# Patient Record
Sex: Female | Born: 1935 | Race: White | Hispanic: No | Marital: Married | Smoking: Never smoker
Health system: Southern US, Community
[De-identification: ages and names within clinical notes are randomized; demographics above are authoritative.]

## PROBLEM LIST (undated history)

## (undated) DIAGNOSIS — N189 Chronic kidney disease, unspecified: Secondary | ICD-10-CM

## (undated) DIAGNOSIS — E039 Hypothyroidism, unspecified: Secondary | ICD-10-CM

## (undated) DIAGNOSIS — F419 Anxiety disorder, unspecified: Secondary | ICD-10-CM

## (undated) DIAGNOSIS — F32A Depression, unspecified: Secondary | ICD-10-CM

## (undated) DIAGNOSIS — E785 Hyperlipidemia, unspecified: Secondary | ICD-10-CM

## (undated) DIAGNOSIS — I10 Essential (primary) hypertension: Secondary | ICD-10-CM

## (undated) DIAGNOSIS — F329 Major depressive disorder, single episode, unspecified: Secondary | ICD-10-CM

## (undated) DIAGNOSIS — D649 Anemia, unspecified: Secondary | ICD-10-CM

## (undated) DIAGNOSIS — I509 Heart failure, unspecified: Secondary | ICD-10-CM

## (undated) DIAGNOSIS — I4892 Unspecified atrial flutter: Secondary | ICD-10-CM

## (undated) DIAGNOSIS — J449 Chronic obstructive pulmonary disease, unspecified: Secondary | ICD-10-CM

## (undated) DIAGNOSIS — C50919 Malignant neoplasm of unspecified site of unspecified female breast: Secondary | ICD-10-CM

## (undated) DIAGNOSIS — Z9981 Dependence on supplemental oxygen: Secondary | ICD-10-CM

## (undated) DIAGNOSIS — R413 Other amnesia: Secondary | ICD-10-CM

## (undated) HISTORY — DX: Essential (primary) hypertension: I10

## (undated) HISTORY — DX: Malignant neoplasm of unspecified site of unspecified female breast: C50.919

## (undated) HISTORY — DX: Hypothyroidism, unspecified: E03.9

## (undated) HISTORY — DX: Unspecified atrial flutter: I48.92

## (undated) HISTORY — DX: Anemia, unspecified: D64.9

## (undated) HISTORY — DX: Chronic obstructive pulmonary disease, unspecified: J44.9

## (undated) HISTORY — DX: Hyperlipidemia, unspecified: E78.5

## (undated) HISTORY — PX: APPENDECTOMY: SHX54

## (undated) HISTORY — DX: Other amnesia: R41.3

## (undated) HISTORY — PX: CATARACT EXTRACTION: SUR2

## (undated) HISTORY — PX: TONSILLECTOMY: SUR1361

## (undated) HISTORY — PX: ABDOMINAL HYSTERECTOMY: SHX81

---

## 1988-12-15 HISTORY — PX: MASTECTOMY: SHX3

## 1998-04-16 ENCOUNTER — Other Ambulatory Visit: Admission: RE | Admit: 1998-04-16 | Discharge: 1998-04-16 | Payer: Self-pay | Admitting: Hematology and Oncology

## 2000-04-13 ENCOUNTER — Encounter: Payer: Self-pay | Admitting: Family Medicine

## 2000-04-13 ENCOUNTER — Encounter: Admission: RE | Admit: 2000-04-13 | Discharge: 2000-04-13 | Payer: Self-pay | Admitting: Family Medicine

## 2001-04-14 ENCOUNTER — Encounter: Admission: RE | Admit: 2001-04-14 | Discharge: 2001-04-14 | Payer: Self-pay | Admitting: Family Medicine

## 2001-04-14 ENCOUNTER — Encounter: Payer: Self-pay | Admitting: Family Medicine

## 2002-04-15 ENCOUNTER — Encounter: Admission: RE | Admit: 2002-04-15 | Discharge: 2002-04-15 | Payer: Self-pay | Admitting: Family Medicine

## 2002-04-15 ENCOUNTER — Encounter: Payer: Self-pay | Admitting: Family Medicine

## 2003-04-19 ENCOUNTER — Encounter: Payer: Self-pay | Admitting: Family Medicine

## 2003-04-19 ENCOUNTER — Encounter: Admission: RE | Admit: 2003-04-19 | Discharge: 2003-04-19 | Payer: Self-pay | Admitting: Family Medicine

## 2004-04-19 ENCOUNTER — Encounter: Admission: RE | Admit: 2004-04-19 | Discharge: 2004-04-19 | Payer: Self-pay | Admitting: Family Medicine

## 2005-04-21 ENCOUNTER — Encounter: Admission: RE | Admit: 2005-04-21 | Discharge: 2005-04-21 | Payer: Self-pay | Admitting: Family Medicine

## 2006-05-20 ENCOUNTER — Encounter: Admission: RE | Admit: 2006-05-20 | Discharge: 2006-05-20 | Payer: Self-pay | Admitting: Family Medicine

## 2007-06-02 ENCOUNTER — Encounter: Admission: RE | Admit: 2007-06-02 | Discharge: 2007-06-02 | Payer: Self-pay | Admitting: Family Medicine

## 2007-08-18 ENCOUNTER — Encounter: Admission: RE | Admit: 2007-08-18 | Discharge: 2007-08-18 | Payer: Self-pay | Admitting: Family Medicine

## 2007-10-24 ENCOUNTER — Observation Stay (HOSPITAL_COMMUNITY): Admission: EM | Admit: 2007-10-24 | Discharge: 2007-10-25 | Payer: Self-pay | Admitting: Emergency Medicine

## 2007-10-25 ENCOUNTER — Encounter: Payer: Self-pay | Admitting: Critical Care Medicine

## 2007-10-25 ENCOUNTER — Encounter (INDEPENDENT_AMBULATORY_CARE_PROVIDER_SITE_OTHER): Payer: Self-pay | Admitting: Internal Medicine

## 2007-11-15 ENCOUNTER — Ambulatory Visit: Payer: Self-pay | Admitting: Internal Medicine

## 2007-11-15 ENCOUNTER — Ambulatory Visit: Payer: Self-pay | Admitting: Critical Care Medicine

## 2007-11-15 LAB — CONVERTED CEMR LAB
BUN: 17 mg/dL (ref 6–23)
CO2: 32 meq/L (ref 19–32)
Calcium: 9 mg/dL (ref 8.4–10.5)
Chloride: 103 meq/L (ref 96–112)
GFR calc Af Amer: 77 mL/min
GFR calc non Af Amer: 64 mL/min
Sodium: 141 meq/L (ref 135–145)

## 2007-11-17 DIAGNOSIS — J449 Chronic obstructive pulmonary disease, unspecified: Secondary | ICD-10-CM

## 2007-11-17 DIAGNOSIS — C50919 Malignant neoplasm of unspecified site of unspecified female breast: Secondary | ICD-10-CM | POA: Insufficient documentation

## 2007-11-17 DIAGNOSIS — J4489 Other specified chronic obstructive pulmonary disease: Secondary | ICD-10-CM | POA: Insufficient documentation

## 2007-11-17 DIAGNOSIS — E785 Hyperlipidemia, unspecified: Secondary | ICD-10-CM

## 2007-11-22 ENCOUNTER — Telehealth (INDEPENDENT_AMBULATORY_CARE_PROVIDER_SITE_OTHER): Payer: Self-pay | Admitting: *Deleted

## 2007-11-30 ENCOUNTER — Ambulatory Visit: Payer: Self-pay | Admitting: Critical Care Medicine

## 2007-11-30 ENCOUNTER — Telehealth (INDEPENDENT_AMBULATORY_CARE_PROVIDER_SITE_OTHER): Payer: Self-pay | Admitting: *Deleted

## 2007-11-30 DIAGNOSIS — M810 Age-related osteoporosis without current pathological fracture: Secondary | ICD-10-CM | POA: Insufficient documentation

## 2007-11-30 DIAGNOSIS — I10 Essential (primary) hypertension: Secondary | ICD-10-CM | POA: Insufficient documentation

## 2008-02-15 ENCOUNTER — Telehealth: Payer: Self-pay | Admitting: Critical Care Medicine

## 2008-03-13 ENCOUNTER — Telehealth (INDEPENDENT_AMBULATORY_CARE_PROVIDER_SITE_OTHER): Payer: Self-pay | Admitting: *Deleted

## 2008-03-21 ENCOUNTER — Ambulatory Visit: Payer: Self-pay | Admitting: Critical Care Medicine

## 2008-07-12 ENCOUNTER — Encounter: Admission: RE | Admit: 2008-07-12 | Discharge: 2008-07-12 | Payer: Self-pay | Admitting: Family Medicine

## 2008-08-11 ENCOUNTER — Ambulatory Visit: Payer: Self-pay | Admitting: Critical Care Medicine

## 2008-08-22 ENCOUNTER — Ambulatory Visit: Payer: Self-pay | Admitting: Critical Care Medicine

## 2008-08-22 ENCOUNTER — Telehealth: Payer: Self-pay | Admitting: Critical Care Medicine

## 2008-08-22 ENCOUNTER — Encounter: Payer: Self-pay | Admitting: Critical Care Medicine

## 2009-05-15 ENCOUNTER — Ambulatory Visit: Payer: Self-pay | Admitting: Critical Care Medicine

## 2009-05-16 ENCOUNTER — Telehealth (INDEPENDENT_AMBULATORY_CARE_PROVIDER_SITE_OTHER): Payer: Self-pay | Admitting: *Deleted

## 2009-05-29 ENCOUNTER — Ambulatory Visit: Payer: Self-pay | Admitting: Critical Care Medicine

## 2009-06-04 ENCOUNTER — Telehealth: Payer: Self-pay | Admitting: Critical Care Medicine

## 2009-06-07 ENCOUNTER — Telehealth (INDEPENDENT_AMBULATORY_CARE_PROVIDER_SITE_OTHER): Payer: Self-pay | Admitting: *Deleted

## 2009-06-25 ENCOUNTER — Telehealth (INDEPENDENT_AMBULATORY_CARE_PROVIDER_SITE_OTHER): Payer: Self-pay | Admitting: *Deleted

## 2009-07-02 ENCOUNTER — Ambulatory Visit: Payer: Self-pay | Admitting: Critical Care Medicine

## 2009-07-05 ENCOUNTER — Encounter: Payer: Self-pay | Admitting: Critical Care Medicine

## 2009-07-31 ENCOUNTER — Encounter: Admission: RE | Admit: 2009-07-31 | Discharge: 2009-07-31 | Payer: Self-pay | Admitting: Family Medicine

## 2009-08-13 ENCOUNTER — Telehealth: Payer: Self-pay | Admitting: Critical Care Medicine

## 2009-08-27 ENCOUNTER — Ambulatory Visit: Payer: Self-pay | Admitting: Critical Care Medicine

## 2009-09-01 ENCOUNTER — Encounter: Payer: Self-pay | Admitting: Critical Care Medicine

## 2009-09-19 ENCOUNTER — Encounter: Admission: RE | Admit: 2009-09-19 | Discharge: 2009-09-19 | Payer: Self-pay | Admitting: Family Medicine

## 2009-11-22 ENCOUNTER — Encounter: Payer: Self-pay | Admitting: Critical Care Medicine

## 2010-03-27 ENCOUNTER — Ambulatory Visit: Payer: Self-pay | Admitting: Critical Care Medicine

## 2010-03-27 ENCOUNTER — Encounter (INDEPENDENT_AMBULATORY_CARE_PROVIDER_SITE_OTHER): Payer: Self-pay | Admitting: *Deleted

## 2010-04-09 ENCOUNTER — Encounter: Payer: Self-pay | Admitting: Critical Care Medicine

## 2010-05-20 ENCOUNTER — Telehealth: Payer: Self-pay | Admitting: Critical Care Medicine

## 2010-05-27 ENCOUNTER — Telehealth: Payer: Self-pay | Admitting: Internal Medicine

## 2010-05-31 ENCOUNTER — Telehealth: Payer: Self-pay | Admitting: Critical Care Medicine

## 2010-05-31 ENCOUNTER — Telehealth (INDEPENDENT_AMBULATORY_CARE_PROVIDER_SITE_OTHER): Payer: Self-pay | Admitting: *Deleted

## 2010-06-03 ENCOUNTER — Telehealth (INDEPENDENT_AMBULATORY_CARE_PROVIDER_SITE_OTHER): Payer: Self-pay | Admitting: *Deleted

## 2010-06-05 ENCOUNTER — Telehealth (INDEPENDENT_AMBULATORY_CARE_PROVIDER_SITE_OTHER): Payer: Self-pay | Admitting: *Deleted

## 2010-06-10 ENCOUNTER — Encounter: Payer: Self-pay | Admitting: Internal Medicine

## 2010-06-18 ENCOUNTER — Telehealth (INDEPENDENT_AMBULATORY_CARE_PROVIDER_SITE_OTHER): Payer: Self-pay | Admitting: *Deleted

## 2010-06-24 ENCOUNTER — Encounter: Payer: Self-pay | Admitting: Critical Care Medicine

## 2010-06-25 ENCOUNTER — Encounter: Payer: Self-pay | Admitting: Internal Medicine

## 2010-07-01 ENCOUNTER — Encounter: Payer: Self-pay | Admitting: Critical Care Medicine

## 2010-08-12 ENCOUNTER — Ambulatory Visit: Payer: Self-pay | Admitting: Critical Care Medicine

## 2010-08-16 ENCOUNTER — Encounter: Payer: Self-pay | Admitting: Critical Care Medicine

## 2010-09-12 ENCOUNTER — Encounter: Admission: RE | Admit: 2010-09-12 | Discharge: 2010-09-12 | Payer: Self-pay | Admitting: Family Medicine

## 2010-09-17 ENCOUNTER — Encounter: Payer: Self-pay | Admitting: Critical Care Medicine

## 2011-01-06 ENCOUNTER — Encounter: Payer: Self-pay | Admitting: Family Medicine

## 2011-01-14 NOTE — Miscellaneous (Signed)
Summary: Face to face encounter/[facility]  Face to face encounter/[facility]   Imported By: Sherian Rein 09/25/2010 10:20:17  _____________________________________________________________________  External Attachment:    Type:   Image     Comment:   External Document

## 2011-01-14 NOTE — Assessment & Plan Note (Signed)
Summary: Pulmonary OV   Primary Provider/Referring Provider:  Herb Grays  CC:  Follow up.  States breathing is worse.  Having increased SOB with activity x 3 wks.  Denies wheezing and chest tightness and cough.  Requesting rx for wheelchair.Marland Kitchen  History of Present Illness: Pulmonary OV:  This is an 77 year old, white female with chronic obstructive lung disease.  Primary emphysematous component.    March 27, 2010 1:50 PM More dyspneic than last ov.  Over past month more issues.  No more cough.   No wheeze.  No mucous.  No chest pain.  No f/c/s.  No edema in feet.  No pndrip.  No at bedtime dyspnea.   Pt denies any significant sore throat, nasal congestion or excess secretions, fever, chills, sweats, unintended weight loss, pleurtic or exertional chest pain, orthopnea PND, or leg swelling Pt denies any increase in rescue therapy over baseline, denies waking up needing it or having any early am or nocturnal exacerbations of coughing/wheezing/or dyspnea.      Preventive Screening-Counseling & Management  Alcohol-Tobacco     Smoking Status: quit > 6 months  Current Medications (verified): 1)  Levothroid 100 Mcg  Tabs (Levothyroxine Sodium) .... One By Mouth Once Daily 2)  Lipitor 20 Mg  Tabs (Atorvastatin Calcium) .... Take 1 Tablet By Mouth Once A Day 3)  Klor-Con M10 10 Meq  Tbcr (Potassium Chloride Crys Cr) .... One By Mouth Once Daily 4)  Furosemide 40 Mg  Tabs (Furosemide) .... One By Mouth Once Daily 5)  Alprazolam 0.25 Mg  Tabs (Alprazolam) .... One By Mouth Three Times A Day As Needed 6)  Adult Aspirin Ec Low Strength 81 Mg  Tbec (Aspirin) .... .qdtab 7)  Lisinopril 40 Mg Tabs (Lisinopril) .... 80mg  By Mouth Daily 8)  Aricept 10 Mg Tabs (Donepezil Hcl) .Marland Kitchen.. 1 By Mouth Daily 9)  Verapamil Hcl Cr 240 Mg Xr24h-Cap (Verapamil Hcl) .... Once Daily 10)  Spiriva Handihaler 18 Mcg  Caps (Tiotropium Bromide Monohydrate) .... Two Puffs in Handihaler Daily 11)  Proair Hfa 108 (90 Base)  Mcg/act Aers (Albuterol Sulfate) .Marland Kitchen.. 1-2 Puffs Every 4 Hours As Needed  Pt Needs Appt With Dr. Delford Field 12)  Oxygen .... 2l Rest 3l Exertion 13)  Citalopram Hydrobromide 20 Mg Tabs (Citalopram Hydrobromide) .... 3 Tabs Once Daily 14)  Calcitonin (Salmon) 200 Unit/act Soln (Calcitonin (Salmon)) .... Once Daily 15)  Vitamin D .... Once Daily 16)  Ambien 10 Mg Tabs (Zolpidem Tartrate) .... At Bedtime 17)  Calcium-Vitamin D 250-125 Mg-Unit Tabs (Calcium Carbonate-Vitamin D) .... Two Times A Day 18)  Iron .... Two Times A Day  Allergies (verified): 1)  ! Codeine  Past History:  Past medical, surgical, family and social histories (including risk factors) reviewed, and no changes noted (except as noted below).  Past Medical History: Reviewed history from 11/30/2007 and no changes required. C O P D Hyperlipidemia Hypertension Breast Cancer R mastectomy 1990 Osteoporosis  Past Pulmonary History:  Pulmonary History: Pulmonary Function Test  Date: 08/22/2008 Height (in.): 64 Gender: Female  Pre-Spirometry  FVC     Value: 1.65 L/min   Pred: 2.49 L/min     % Pred: 66 % FEV1     Value: 0.97 L     Pred: 1.66 L     % Pred: 58 % FEV1/FVC   Value: 59 %     Pred: 69 %     FEF 25-75   Value: 0.37 L/min   Pred: 1.87 L/min     %  Pred: 20 %  Post-Spirometry  FVC     Value: 1.94 L/min   Pred: 2.49 L/min     % Pred: 78 % FEV1     Value: 1.00 L     Pred: 1.66 L     % Pred: 60 % FEV1/FVC   Value: 52 %     Pred: 69 %     FEF 25-75   Value: 0.19 L/min   Pred: 1.87 L/min     % Pred: 10 %  Lung Volumes  TLC     Value: 4.57 L   % Pred: 97 % RV     Value: 2.92 L   % Pred: 142 % DLCO     Value: 9.7 %   % Pred: 46 % DLCO/VA   Value: 3.08 %   % Pred: 97 %  Family History: Reviewed history from 11/30/2007 and no changes required. Emphysema:  MI/Heart Attack  Social History: Reviewed history from 11/30/2007 and no changes required. Patient never smoked.  Positive history of passive tobacco smoke  exposure.   Review of Systems       The patient complains of shortness of breath with activity.  The patient denies shortness of breath at rest, productive cough, non-productive cough, coughing up blood, chest pain, irregular heartbeats, acid heartburn, indigestion, loss of appetite, weight change, abdominal pain, difficulty swallowing, sore throat, tooth/dental problems, headaches, nasal congestion/difficulty breathing through nose, sneezing, itching, ear ache, anxiety, depression, hand/feet swelling, joint stiffness or pain, rash, change in color of mucus, and fever.    Vital Signs:  Patient profile:   77 year old female Height:      64 inches Weight:      208.25 pounds O2 Sat:      90 % on 2 L/minpulsed Temp:     98.3 degrees F oral Pulse rate:   55 / minute BP sitting:   118 / 64  (left arm) Cuff size:   regular  Vitals Entered By: Gweneth Dimitri RN (March 27, 2010 1:45 PM)  O2 Flow:  2 L/minpulsed CC: Follow up.  States breathing is worse.  Having increased SOB with activity x 3 wks.  Denies wheezing, chest tightness and cough.  Requesting rx for wheelchair. Comments Medications reviewed with patient Daytime contact number verified with patient. Gweneth Dimitri RN  March 27, 2010 1:46 PM    Physical Exam  Additional Exam:  Gen: Pleasant, well-nourished, in no distress , normal affect ENT: no lesions, no post nasal drip Neck: No JVD, no TMG, no carotid bruits Lungs: No use of accessory muscles, no dullness to percussion, distant Bs,  poor airflow Cardiovascular: RRR, heart sounds normal, no murmurs or gallops, no peripheral edema Abdomen: soft and non-tender, no HSM, BS normal Musculoskeletal: No deformities, no cyanosis or clubbing Neuro: alert, non-focal     Pulmonary Function Test Date: 03/27/2010 2:07 PM Gender: Female  Pre-Spirometry FVC    Value: 1.12 L/min   % Pred: 47.40 % FEV1    Value: 0.56 L     Pred: 1.74 L     % Pred: 32.40 % FEV1/FVC  Value: 50.39 %      % Pred: 69.30 %  Impression & Recommendations:  Problem # 1:  C O P D (ICD-496) Assessment Deteriorated  COPD with primary emphysematous component,  progression of disease and dyspnea seen  plan: Cont Spiriva Cont oxygen Pulse prednisone  Wheelchair for the patient   Medications Added to Medication List This Visit: 1)  Citalopram Hydrobromide 20 Mg Tabs (Citalopram hydrobromide) .... 3 tabs once daily 2)  Calcitonin (salmon) 200 Unit/act Soln (Calcitonin (salmon)) .... Once daily 3)  Vitamin D  .... Once daily 4)  Ambien 10 Mg Tabs (Zolpidem tartrate) .... At bedtime 5)  Calcium-vitamin D 250-125 Mg-unit Tabs (Calcium carbonate-vitamin d) .... Two times a day 6)  Iron  .... Two times a day 7)  Prednisone 10 Mg Tabs (Prednisone) .... Take as directed take 4 daily for two days, then 3 daily for two days, then two daily for two days then one daily for two days then stop  Complete Medication List: 1)  Levothroid 100 Mcg Tabs (Levothyroxine sodium) .... One by mouth once daily 2)  Lipitor 20 Mg Tabs (Atorvastatin calcium) .... Take 1 tablet by mouth once a day 3)  Klor-con M10 10 Meq Tbcr (Potassium chloride crys cr) .... One by mouth once daily 4)  Furosemide 40 Mg Tabs (Furosemide) .... One by mouth once daily 5)  Alprazolam 0.25 Mg Tabs (Alprazolam) .... One by mouth three times a day as needed 6)  Adult Aspirin Ec Low Strength 81 Mg Tbec (Aspirin) .... .qdtab 7)  Lisinopril 40 Mg Tabs (Lisinopril) .... 80mg  by mouth daily 8)  Aricept 10 Mg Tabs (Donepezil hcl) .Marland Kitchen.. 1 by mouth daily 9)  Verapamil Hcl Cr 240 Mg Xr24h-cap (Verapamil hcl) .... Once daily 10)  Spiriva Handihaler 18 Mcg Caps (Tiotropium bromide monohydrate) .... Two puffs in handihaler daily 11)  Proair Hfa 108 (90 Base) Mcg/act Aers (Albuterol sulfate) .Marland Kitchen.. 1-2 puffs every 4 hours as needed  pt needs appt with dr. Delford Field 12)  Oxygen  .... 2l rest 3l exertion 13)  Citalopram Hydrobromide 20 Mg Tabs (Citalopram  hydrobromide) .... 3 tabs once daily 14)  Calcitonin (salmon) 200 Unit/act Soln (Calcitonin (salmon)) .... Once daily 15)  Vitamin D  .... Once daily 16)  Ambien 10 Mg Tabs (Zolpidem tartrate) .... At bedtime 17)  Calcium-vitamin D 250-125 Mg-unit Tabs (Calcium carbonate-vitamin d) .... Two times a day 18)  Iron  .... Two times a day 19)  Prednisone 10 Mg Tabs (Prednisone) .... Take as directed take 4 daily for two days, then 3 daily for two days, then two daily for two days then one daily for two days then stop  Other Orders: Spirometry w/Graph (94010) Est. Patient Level IV (52778) Prescription Created Electronically [id number]) DME Referral (DME)  Patient Instructions: 1)  Prednisone 10mg  Take 4 daily for two days, then 3 daily for two days, then two daily for two days then one daily for two days then stop 2)  Stay on Spiriva  3)  A Wheelchair will be ordered  4)  Return 4 months  Prescriptions: PREDNISONE 10 MG  TABS (PREDNISONE) Take as directed Take 4 daily for two days, then 3 daily for two days, then two daily for two days then one daily for two days then stop  #20 x 0   Entered and Authorized by:   Storm Frisk MD   Signed by:   Storm Frisk MD on 03/27/2010   Method used:   Electronically to        [facility] [address] (retail)       [address] [place] [address]       [place], [place]  [address]       Ph: [phone] or [phone]       Fax: [phone]   RxID:   [id number]  CardioPerfect Spirometry  ID: [id number] Patient: KEELY, DRENNAN DOB: [birth year] Age: 77 Years Old Sex: Female Race: White Height: 64 Weight: 208.25 Status: Confirmed Past Medical History:  C O P D Hyperlipidemia Hypertension Breast Cancer R mastectomy 1990 Osteoporosis  Recorded: 03/27/2010 2:07 PM  Parameter  Measured Predicted %Predicted FVC     1.12        2.36        47.40 FEV1     0.56        1.74        32.40 FEV1%   50.39        72.75        69.30 PEF    2.05        4.25         48.20   Comments: Severe airflow obstruction  Interpretation: Pre: FVC= 1.12L FEV1= 0.56L FEV1%= 50.4% 0.56/1.12 FEV1/FVC (03/27/2010 2:09:03 PM), Very severe obstruction.Severe restriction     Appended Document: Pulmonary OV fax Herb Grays

## 2011-01-14 NOTE — Assessment & Plan Note (Signed)
Summary: Pulmonary OV   Primary Provider/Referring Provider:  Herb Grays  CC:  4 month follow up.  Pt states she does have SOB with activity but this is no worse from last OV.  Denies wheezing, chest tightness, and cough.  .  History of Present Illness: Pulmonary OV:  This is an 77 year old, white female with chronic obstructive lung disease.  Primary emphysematous component.      August 12, 2010 12:12 PM Dyspnea is the same.  comes and goes.  No cough or mucus.  No chest pains.  No edema in feet.   No nocturnal dyspnea.    No new issues.  Lives alone with son living next door.  Preventive Screening-Counseling & Management  Alcohol-Tobacco     Smoking Status: quit > 6 months     Passive Smoke Exposure: yes  Current Medications (verified): 1)  Levothroid 100 Mcg  Tabs (Levothyroxine Sodium) .... One By Mouth Once Daily 2)  Lipitor 20 Mg  Tabs (Atorvastatin Calcium) .... Take 1 Tablet By Mouth Once A Day 3)  Klor-Con M10 10 Meq  Tbcr (Potassium Chloride Crys Cr) .... One By Mouth Once Daily 4)  Furosemide 40 Mg  Tabs (Furosemide) .... One By Mouth Once Daily 5)  Alprazolam 0.25 Mg  Tabs (Alprazolam) .... One By Mouth Three Times A Day As Needed 6)  Adult Aspirin Ec Low Strength 81 Mg  Tbec (Aspirin) .... .qdtab 7)  Lisinopril 40 Mg Tabs (Lisinopril) .... 80mg  By Mouth Daily 8)  Aricept 10 Mg Tabs (Donepezil Hcl) .Marland Kitchen.. 1 By Mouth Daily 9)  Verapamil Hcl Cr 240 Mg Xr24h-Cap (Verapamil Hcl) .... Once Daily 10)  Proair Hfa 108 (90 Base) Mcg/act Aers (Albuterol Sulfate) .Marland Kitchen.. 1-2 Puffs Every 4 Hours As Needed  Pt Needs Appt With Dr. Delford Field 11)  Oxygen .... 2l Rest 3l Exertion 12)  Citalopram Hydrobromide 20 Mg Tabs (Citalopram Hydrobromide) .... 3 Tabs Once Daily 13)  Calcitonin (Salmon) 200 Unit/act Soln (Calcitonin (Salmon)) .... Once Daily 14)  Vitamin D .... Once Daily 15)  Ambien 10 Mg Tabs (Zolpidem Tartrate) .... At Bedtime 16)  Calcium-Vitamin D 250-125 Mg-Unit Tabs (Calcium  Carbonate-Vitamin D) .... Two Times A Day 17)  Iron .... Two Times A Day  Allergies (verified): 1)  ! Codeine  Past Pulmonary History:  Pulmonary History: Pulmonary Function Test  Date: 08/22/2008 Height (in.): 64 Gender: Female  Pre-Spirometry  FVC     Value: 1.65 L/min   Pred: 2.49 L/min     % Pred: 66 % FEV1     Value: 0.97 L     Pred: 1.66 L     % Pred: 58 % FEV1/FVC   Value: 59 %     Pred: 69 %     FEF 25-75   Value: 0.37 L/min   Pred: 1.87 L/min     % Pred: 20 %  Post-Spirometry  FVC     Value: 1.94 L/min   Pred: 2.49 L/min     % Pred: 78 % FEV1     Value: 1.00 L     Pred: 1.66 L     % Pred: 60 % FEV1/FVC   Value: 52 %     Pred: 69 %     FEF 25-75   Value: 0.19 L/min   Pred: 1.87 L/min     % Pred: 10 %  Lung Volumes  TLC     Value: 4.57 L   % Pred: 97 % RV  Value: 2.92 L   % Pred: 142 % DLCO     Value: 9.7 %   % Pred: 46 % DLCO/VA   Value: 3.08 %   % Pred: 97 %  Review of Systems       The patient complains of shortness of breath with activity and non-productive cough.  The patient denies shortness of breath at rest, productive cough, coughing up blood, chest pain, irregular heartbeats, acid heartburn, indigestion, loss of appetite, weight change, abdominal pain, difficulty swallowing, sore throat, tooth/dental problems, headaches, nasal congestion/difficulty breathing through nose, sneezing, itching, ear ache, anxiety, depression, hand/feet swelling, joint stiffness or pain, rash, change in color of mucus, and fever.    Vital Signs:  Patient profile:   77 year old female Height:      64 inches Weight:      202 pounds BMI:     34.80 O2 Sat:      95 % on 3 L/minpulsed Temp:     98.5 degrees F oral Pulse rate:   66 / minute BP sitting:   126 / 66  (left arm) Cuff size:   regular  Vitals Entered By: Gweneth Dimitri RN (August 12, 2010 12:08 PM)  O2 Flow:  3 L/minpulsed CC: 4 month follow up.  Pt states she does have SOB with activity but this is no worse from last  OV.  Denies wheezing, chest tightness, cough.   Comments Medications reviewed with patient Daytime contact number verified with patient. Gweneth Dimitri RN  August 12, 2010 12:08 PM    Physical Exam  Additional Exam:  Gen: Pleasant, well-nourished, in no distress , normal affect ENT: no lesions, no post nasal drip Neck: No JVD, no TMG, no carotid bruits Lungs: No use of accessory muscles, no dullness to percussion, distant Bs,  poor airflow Cardiovascular: RRR, heart sounds normal, no murmurs or gallops, no peripheral edema Abdomen: soft and non-tender, no HSM, BS normal Musculoskeletal: No deformities, no cyanosis or clubbing Neuro: alert, non-focal     Impression & Recommendations:  Problem # 1:  C O P D (ICD-496) Assessment Unchanged COPD with primary emphysematous component  plan: Cont Spiriva Cont oxygen  Complete Medication List: 1)  Levothroid 100 Mcg Tabs (Levothyroxine sodium) .... One by mouth once daily 2)  Lipitor 20 Mg Tabs (Atorvastatin calcium) .... Take 1 tablet by mouth once a day 3)  Klor-con M10 10 Meq Tbcr (Potassium chloride crys cr) .... One by mouth once daily 4)  Furosemide 40 Mg Tabs (Furosemide) .... One by mouth once daily 5)  Alprazolam 0.25 Mg Tabs (Alprazolam) .... One by mouth three times a day as needed 6)  Adult Aspirin Ec Low Strength 81 Mg Tbec (Aspirin) .... .qdtab 7)  Lisinopril 40 Mg Tabs (Lisinopril) .... 80mg  by mouth daily 8)  Aricept 10 Mg Tabs (Donepezil hcl) .Marland Kitchen.. 1 by mouth daily 9)  Verapamil Hcl Cr 240 Mg Xr24h-cap (Verapamil hcl) .... Once daily 10)  Proair Hfa 108 (90 Base) Mcg/act Aers (Albuterol sulfate) .Marland Kitchen.. 1-2 puffs every 4 hours as needed  pt needs appt with dr. Delford Field 11)  Oxygen  .... 2l rest 3l exertion 12)  Citalopram Hydrobromide 20 Mg Tabs (Citalopram hydrobromide) .... 3 tabs once daily 13)  Calcitonin (salmon) 200 Unit/act Soln (Calcitonin (salmon)) .... Once daily 14)  Vitamin D  .... Once daily 15)  Ambien 10  Mg Tabs (Zolpidem tartrate) .... At bedtime 16)  Calcium-vitamin D 250-125 Mg-unit Tabs (Calcium carbonate-vitamin d) .... Two times  a day 17)  Iron  .... Two times a day  Patient Instructions: 1)  No change in medications 2)  Return in    4      months

## 2011-01-14 NOTE — Miscellaneous (Signed)
Summary: Plan of Care & Treatment/[facility]  Plan of Care & Treatment/[facility]   Imported By: Sherian Rein 06/27/2010 10:26:03  _____________________________________________________________________  External Attachment:    Type:   Image     Comment:   External Document

## 2011-01-14 NOTE — Miscellaneous (Signed)
Summary: Order / [facility]  Order / [facility]   Imported By: Lennie Odor 06/14/2010 11:36:47  _____________________________________________________________________  External Attachment:    Type:   Image     Comment:   External Document

## 2011-01-14 NOTE — Miscellaneous (Signed)
Summary: F2F/[facility]  F2F/[facility]   Imported By: Lester [place] 07/01/2010 10:36:14  _____________________________________________________________________  External Attachment:    Type:   Image     Comment:   External Document

## 2011-01-14 NOTE — Miscellaneous (Signed)
Summary: Orders/[facility]  Orders/[facility]   Imported By: Lester [place] 07/04/2010 10:48:50  _____________________________________________________________________  External Attachment:    Type:   Image     Comment:   External Document

## 2011-01-14 NOTE — Letter (Signed)
Summary: CMN for Wheelchair/[facility]  CMN for Wheelchair/[facility]   Imported By: Sherian Rein 04/15/2010 07:51:59  _____________________________________________________________________  External Attachment:    Type:   Image     Comment:   External Document

## 2011-01-14 NOTE — Progress Notes (Signed)
Summary: order  Phone Note From Other Clinic Call back at [phone]   Caller: Patient Caller: [facility]  Britta Mccreedy Call For: wert Summary of Call: need order for bathe nurse . pt have also reduce nurse visit this week to one time this weekly Initial call taken by: Rickard Patience,  June 05, 2010 1:39 PM  Follow-up for Phone Call        ok for aide to hep with baths per dr wert Follow-up by: Philipp Deputy CMA,  June 05, 2010 3:56 PM

## 2011-01-14 NOTE — Progress Notes (Signed)
Summary: order request  Phone Note Call from Patient   Caller: Daughter Call For: wright Summary of Call: dr wert (in dr wright's absence) approved for pt to have home health care. daughter muffett garber requests that this order be faxed to [facility] [phone] attn: [place] [facility]. this will move this along quicker. thanks. muffett # [phone] Initial call taken by: Tivis Ringer, CNA,  May 31, 2010 9:06 AM  Follow-up for Phone Call        order printed from EMR and faxed to above fax number Attn: [place] [facility].  Pt's daughter Muffett aware.  Gweneth Dimitri RN  May 31, 2010 9:57 AM

## 2011-01-14 NOTE — Progress Notes (Signed)
Summary: bath assistance> orders should be thur Primary  Phone Note Call from Patient Call back at [phone]   Caller: Daughter-Muffett August Luz Call For: Delford Field Reason for Call: Talk to Nurse Summary of Call: pt's daughter called, told her PEW was out the week and she wanted to know if another doctor would write order for bath assistance for pt?  If they have to wait for PEW they will.  Pt's PMD is out of [place] for 6 weeks, but would like [place] to ask doc of day about this assistance. Initial call taken by: Eugene Gavia,  May 27, 2010 10:03 AM  Follow-up for Phone Call        Pt daughter is requesting to have an order placed to [facility] requesitng some home health for helping her mother bathe, wash her hair and assist with clipping her toenails. Pt PMD is out of the country x 6 weeks and PW out of office and pt does not want to wait for order so she requests we send this to doc-of -the day. Pelase advise if ok to palce order.  Thanks. Carron Curie CMA  May 27, 2010 10:34 AM Babette Relic [place] is primary and should have doctors covering for her who can take care of her messages while she's gone for that long. We are just doing her pulmonary orders.  If not able to get this thru Dr Yehuda Budd office then ok to approve Follow-up by: Nyoka Cowden MD,  May 27, 2010 10:45 AM  Additional Follow-up for Phone Call Additional follow up Details #1::        Spoke with pt daughter and PMD office is not wanting to send order, so I advised that we will do so. order placed and sent to [facility]. Carron Curie CMA  May 27, 2010 11:43 AM      Appended Document: bath assistance> orders should be thur Primary noted adn I am ok with this

## 2011-01-14 NOTE — Progress Notes (Signed)
Summary: DME[facility] calling  Phone Note From Other Clinic   Caller: heather w/ [facility] Call For: wright Summary of Call: caller received DME for pt. however, they don't send a person to pt's home for personal care. only supply O2, etc. heather [phone] x [phone] Initial call taken by: Tivis Ringer, CNA,  May 31, 2010 10:18 AM  Follow-up for Phone Call        spoke to [facility] she has been informed that she needs to contact lecritia about any problems she has with orders,lmtcb with lecritia  Additional Follow-up for Phone Call Additional follow up Details #1::        noted  Additional Follow-up by: Storm Frisk MD,  May 31, 2010 12:14 PM

## 2011-01-14 NOTE — Progress Notes (Signed)
Summary: order request  Phone Note From Other Clinic   Caller: louise w/ [facility] Call For: wright Summary of Call: requests order for tub seat since pt is unable to lift legs. [phone] (verbal order request) louise Initial call taken by: Tivis Ringer, CNA,  June 03, 2010 3:11 PM  Follow-up for Phone Call        Dr Delford Field please advise if this is okay, thanks! Follow-up by: Vernie Murders,  June 03, 2010 3:37 PM  Additional Follow-up for Phone Call Additional follow up Details #1::        this is ok Additional Follow-up by: Storm Frisk MD,  June 03, 2010 5:39 PM    Additional Follow-up for Phone Call Additional follow up Details #2::    LMOM FOR LOUISE AT [facility] THAT IT IS OK FOR TUB SEAT--ONLY A VERBAL ORDER WAS NEEDED Follow-up by: Philipp Deputy CMA,  June 04, 2010 8:29 AM

## 2011-01-14 NOTE — Miscellaneous (Signed)
Summary: Orders Update  Clinical Lists Changes  Orders: Added new Service order of Est. Patient Level III (99213) - Signed 

## 2011-01-14 NOTE — Progress Notes (Signed)
Summary: [facility]  Phone Note From Other Clinic   Caller: [phone] OR FAX  TO [phone] Call For: WRIGHT Summary of Call: WANTS AN ORDER FOR PHYSCIAL THERAPY SENT TO [facility] Initial call taken by: Lacinda Axon,  June 18, 2010 12:49 PM  Follow-up for Phone Call        dr Delford Field [facility] is requesting order for physical therapy-eval and treat, pt had a recent fall and they think this may help with strength building--i advised them to contact primary md and they state primary refuses to order and they was advised to contact you since all orders have come from you so far.--pls advise if ok to send Follow-up by: Philipp Deputy CMA,  June 19, 2010 10:00 AM  Additional Follow-up for Phone Call Additional follow up Details #1::        i am ok with PT order in this patient Additional Follow-up by: Storm Frisk MD,  June 19, 2010 10:47 AM    Additional Follow-up for Phone Call Additional follow up Details #2::    order sent to [facility] for pt eval and treat and barbara with [facility] order is being faxed Follow-up by: Philipp Deputy CMA,  June 19, 2010 11:34 AM

## 2011-01-14 NOTE — Progress Notes (Signed)
Summary: order  Phone Note Call from Patient Call back at [phone]   Caller: Daughter//muffett Call For: [name] Summary of Call: Need an order to have someone come in and assist her mom with bath. Initial call taken by: Darletta Moll,  May 20, 2010 10:16 AM  Follow-up for Phone Call        Pt daughter is requesting to have an order placed to [facility] for helping her mother bathe, was her hair and assist with clipping her toenails. daughter advised PW out of office until 05-27-10 and ok to wait until then. Please advise. Carron Curie CMA  May 20, 2010 11:23 AM  Message was sent back into triage unanswered so I called the pt daughter and advised that it would be best for them to either contact the pt PMD or call back on monday to have this addressed by PW. Daughter states that [place] so she will call back on monday to ask PW about order. Carron Curie CMA  May 21, 2010 10:36 AM

## 2011-04-29 ENCOUNTER — Ambulatory Visit (INDEPENDENT_AMBULATORY_CARE_PROVIDER_SITE_OTHER): Payer: Medicare Other | Admitting: Critical Care Medicine

## 2011-04-29 ENCOUNTER — Encounter: Payer: Self-pay | Admitting: Critical Care Medicine

## 2011-04-29 DIAGNOSIS — J449 Chronic obstructive pulmonary disease, unspecified: Secondary | ICD-10-CM

## 2011-04-29 MED ORDER — TIOTROPIUM BROMIDE MONOHYDRATE 18 MCG IN CAPS: 18.0000 ug | ORAL_CAPSULE | Freq: Every day | RESPIRATORY_TRACT | Status: DC

## 2011-04-29 NOTE — H&P (Signed)
NAME:  [name], [name]                  ACCOUNT NO.:  [id number]   MEDICAL RECORD NO.:  [id number]          PATIENT TYPE:  INP   LOCATION:  5710                         FACILITY:  [facility]   PHYSICIAN:  Lonia Blood, M.D.       DATE OF BIRTH:  1922-11-10   DATE OF ADMISSION:  10/24/2007  DATE OF DISCHARGE:                              HISTORY & PHYSICAL   PRIMARY CARE PHYSICIAN:  [name] R. Collins Scotland, M.D.   CHIEF COMPLAINT:  Dyspnea.   HISTORY OF PRESENT ILLNESS:  [name] is an 77 year old woman with  longstanding history of hypertension, who was brought today to the  emergency room after she had about one-week of worsening dyspnea on  exertion.  The patient had an episode 3 days prior to admission, where  she was awakened in the middle of the night and was really short of  breath.  The patient reports that she woke up initially just to pass  urine, but then as she walked to the bathroom and back she got very  short of breath and panicky.  [name] denies any episode of chest  pain.  The patient denies any prior history of any cardiac disorders.  The patient has never smoked and she has never worked in an environment  with a lot of dust or chemicals.  The patient does not have a strong  family history of asthma or allergic conditions.   PAST MEDICAL HISTORY:  1. Hypertension.  2. Hypothyroidism.  3. Anxiety.  4. Breast cancer, status post right mastectomy and Tamoxifen therapy.  5. Appendectomy.  6. Hysterectomy.   HOME MEDICATIONS:  1. Hydrochlorothiazide 25 mg daily.  2. Synthroid 100 mcg daily.  3. Alprazolam 0.5 mg as needed.  4. Norvasc 5 mg daily.  5. Zoloft 100 mg daily.  6. Lipitor 20 mg daily.  7. Ambien as needed for sleep.   SOCIAL HISTORY:  The patient is a widow.  She lives alone.  She has 2  grown children.  She is a retired Engineer, site.  She has never smoked  cigarettes.  She does not drink alcohol.   FAMILY HISTORY:  The patient's parents died in their 66s,  and the  patient's mother had heart problems at the end of her life.  The patient  had a sister with pancreatic cancer, another sister with lung cancer,  and another sister with emphysema.   REVIEW OF SYSTEMS:  As per HPI; all other systems are negative.   PHYSICAL EXAMINATION:  Upon admission:  Temperature 98.5, blood pressure  154/81, pulse 85, respirations 20, saturation 97% on room air.  GENERAL APPEARANCE:  Mildly obese female, lying in bed without any acute  distress.  She is alert and oriented to place, person and time.  HEENT:  Head normocephalic and atraumatic.  Eyes:  Pupils are equal,  round and reactive to light.  Extraocular movements intact.  Throat is  clear.  NECK:  Supple.  No JVD.  CHEST:  Clear to auscultation on forceful expiration.  There were  bilateral wheezes.  There were no rhonchi  and no crackles.  HEART:  Regular rate and rhythm with an S4 gallop, best heard at the  apex.  No murmurs.  ABDOMEN:  Soft, nontender and nondistended.  Bowel sounds are present.  The liver is palpable 2 cm below the right costal margin.  EXTREMITIES:  Lower extremities are plus-1 with bilateral edema.  Pulses  are present and symmetric.  NEUROLOGIC:  Cranial nerves III-XII are intact.  Sensation intact in all  4 extremities.  Strength 5/5 in all 4 extremities.   ADMISSION LABORATORIES:  White blood cell count 10.7, hemoglobin 12.8,  platelet count 207.  Myoglobin 102, CK-MB less than 1; Troponin I less  than 0.05.  Prothrombin time 14.2.  D-dimer 0.32.  Sodium 134, potassium  3.7, chloride 99, bicarb 28, BUN 8, creatinine 0.6, glucose 131,  bilirubin 1.2, alkaline phosphatase 66, AST 17, ALT 14, albumin 3.6,  calcium 8.6.  BNP 268.  pH 7.41, pCO2 47, pO2 52.  Portable chest x-ray  shows bilateral scarring in the right upper lobe and left lower lobe.   ASSESSMENT AND PLAN:  An 77 year old woman with worsening dyspnea and  hypoxia.  The theology is a little bit elusive.  I wonder  if we are  dealing here with a diastolic heart failure, with some exacerbation;  versus a chronic emphysematous lung condition.  I would treat the  patient for a possibility of both, with nebulizers, steroids and  diuretics.  Further testing will be done tomorrow, with pulmonary  function tests transthoracic echocardiogram and a dedicated CT of the  chest.      Lonia Blood, M.D.  Electronically Signed     SL/MEDQ  D:  10/24/2007  T:  10/24/2007  Job:  [id number]   cc:   [name] R. Collins Scotland, M.D.

## 2011-04-29 NOTE — Progress Notes (Signed)
Subjective:    Patient ID: [name], female    DOB: 1922-08-10, 77 y.o.   MRN: [id number]  HPI This is an 77 year old, white female with chronic obstructive lung disease. Primary emphysematous component.  August 12, 2010 12:12 PM  Dyspnea is the same. comes and goes. No cough or mucus. No chest pains. No edema in feet.  No nocturnal dyspnea. No new issues. Lives alone with son living next door.  04/29/2011 Doing ok since last ov.  Energy levels low. Spends time going bedroom to chair and sits in chair all the time.  Has a caretaker.  Stays in the home still.  Periods of dyspnea.    Past Medical History  Diagnosis Date  . COPD (chronic obstructive pulmonary disease)   . Hyperlipidemia   . Hypertension   . Breast cancer   . Osteoporosis      Family History  Problem Relation Age of Onset  . Emphysema    . Heart attack       History   Social History  . Marital Status: Married    Spouse Name: N/A    Number of Children: N/A  . Years of Education: N/A   Occupational History  . Not on file.   Social History Main Topics  . Smoking status: Never Smoker   . Smokeless tobacco: Never Used  . Alcohol Use: Not on file  . Drug Use: Not on file  . Sexually Active: Not on file   Other Topics Concern  . Not on file   Social History Narrative  . No narrative on file     Allergies  Allergen Reactions  . Codeine     REACTION: nausea     Outpatient Prescriptions Prior to Visit  Medication Sig Dispense Refill  . albuterol (PROAIR HFA) 108 (90 BASE) MCG/ACT inhaler Inhale 2 puffs into the lungs every 4 (four) hours as needed.        . ALPRAZolam (XANAX) 0.25 MG tablet Take 0.25 mg by mouth at bedtime as needed.        Marland Kitchen aspirin 81 MG tablet Take 81 mg by mouth daily.        Marland Kitchen atorvastatin (LIPITOR) 20 MG tablet Take 20 mg by mouth daily.        . calcium-vitamin D (OSCAL) 250-125 MG-UNIT per tablet Take 1 tablet by mouth 2 (two) times daily.        . citalopram (CELEXA) 20  MG tablet 3 tabs once daily       . donepezil (ARICEPT) 10 MG tablet Take 10 mg by mouth daily.        . ferrous sulfate 325 (65 FE) MG tablet Take 325 mg by mouth 2 (two) times daily.        . furosemide (LASIX) 40 MG tablet Take 40 mg by mouth daily.        Marland Kitchen levothyroxine (SYNTHROID, LEVOTHROID) 100 MCG tablet Take 100 mcg by mouth daily.        Marland Kitchen lisinopril (PRINIVIL,ZESTRIL) 40 MG tablet Take 40 mg by mouth daily.        . potassium chloride (KLOR-CON) 10 MEQ CR tablet Take 10 mEq by mouth daily.        . verapamil (VERELAN PM) 240 MG 24 hr capsule Take 240 mg by mouth at bedtime.        Marland Kitchen VITAMIN D, CHOLECALCIFEROL, PO Take 1 capsule by mouth daily.        Marland Kitchen zolpidem (AMBIEN)  10 MG tablet Take 10 mg by mouth at bedtime as needed.        . calcitonin, salmon, (MIACALCIN/FORTICAL) 200 UNIT/ACT nasal spray 1 spray by Nasal route daily.           Review of Systems Constitutional:   No  weight loss, night sweats,  Fevers, chills, fatigue, lassitude. HEENT:   No headaches,  Difficulty swallowing,  Tooth/dental problems,  Sore throat,                No sneezing, itching, ear ache, nasal congestion, post nasal drip,   CV:  No chest pain,  Orthopnea, PND, swelling in lower extremities, anasarca, dizziness, palpitations  GI  No heartburn, indigestion, abdominal pain, nausea, vomiting, diarrhea, change in bowel habits, loss of appetite  Resp: Notes  shortness of breath with exertion not at rest.  No excess mucus, no productive cough,  No non-productive cough,  No coughing up of blood.  No change in color of mucus.  No wheezing.  No chest wall deformity  Skin: no rash or lesions.  GU: no dysuria, change in color of urine, no urgency or frequency.  No flank pain.  MS:  No joint pain or swelling.  No decreased range of motion.  No back pain.  Psych:  No change in mood or affect. No depression or anxiety.  No memory loss.     Objective:   Physical Exam Filed Vitals:   04/29/11 1352  BP:  124/82  Pulse: 124  Temp: 98.4 F (36.9 C)  TempSrc: Oral  SpO2: 92%    Gen: Pleasant,obese ,elderly WF , in no distress,  normal affect  ENT: No lesions,  mouth clear,  oropharynx clear, no postnasal drip  Neck: No JVD, no TMG, no carotid bruits  Lungs: No use of accessory muscles, no dullness to percussion, distant BS  Cardiovascular: RRR, heart sounds normal, no murmur or gallops, no peripheral edema  Abdomen: soft and NT, no HSM,  BS normal  Musculoskeletal: No deformities, no cyanosis or clubbing  Neuro: alert, non focal  Skin: Warm, no lesions or rashes        Assessment & Plan:   C O P D Golds stage IV Copd oxygen dependent Plan Cont spiriva MAy increase oxygen to 3L rest prn Return 4 months    Updated Medication List Outpatient Encounter Prescriptions as of 04/29/2011  Medication Sig Dispense Refill  . albuterol (PROAIR HFA) 108 (90 BASE) MCG/ACT inhaler Inhale 2 puffs into the lungs every 4 (four) hours as needed.        . ALPRAZolam (XANAX) 0.25 MG tablet Take 0.25 mg by mouth at bedtime as needed.        Marland Kitchen aspirin 81 MG tablet Take 81 mg by mouth daily.        Marland Kitchen atorvastatin (LIPITOR) 20 MG tablet Take 20 mg by mouth daily.        . calcium-vitamin D (OSCAL) 250-125 MG-UNIT per tablet Take 1 tablet by mouth 2 (two) times daily.        . citalopram (CELEXA) 20 MG tablet 3 tabs once daily       . donepezil (ARICEPT) 10 MG tablet Take 10 mg by mouth daily.        . ferrous sulfate 325 (65 FE) MG tablet Take 325 mg by mouth 2 (two) times daily.        . furosemide (LASIX) 40 MG tablet Take 40 mg by mouth daily.        Marland Kitchen  levothyroxine (SYNTHROID, LEVOTHROID) 100 MCG tablet Take 100 mcg by mouth daily.        Marland Kitchen lisinopril (PRINIVIL,ZESTRIL) 40 MG tablet Take 40 mg by mouth daily.        . potassium chloride (KLOR-CON) 10 MEQ CR tablet Take 10 mEq by mouth daily.        Marland Kitchen tiotropium (SPIRIVA) 18 MCG inhalation capsule Place 1 capsule (18 mcg total) into  inhaler and inhale daily.  30 capsule  6  . verapamil (VERELAN PM) 240 MG 24 hr capsule Take 240 mg by mouth at bedtime.        Marland Kitchen VITAMIN D, CHOLECALCIFEROL, PO Take 1 capsule by mouth daily.        Marland Kitchen zolpidem (AMBIEN) 10 MG tablet Take 10 mg by mouth at bedtime as needed.        Marland Kitchen DISCONTD: tiotropium (SPIRIVA) 18 MCG inhalation capsule Place 18 mcg into inhaler and inhale daily.        Marland Kitchen DISCONTD: calcitonin, salmon, (MIACALCIN/FORTICAL) 200 UNIT/ACT nasal spray 1 spray by Nasal route daily.

## 2011-04-29 NOTE — Discharge Summary (Signed)
NAMEJalesha, [name]                  ACCOUNT NO.:  [id number]   MEDICAL RECORD NO.:  [id number]          PATIENT TYPE:  OBV   LOCATION:  5710                         FACILITY:  [facility]   PHYSICIAN:  Lonia Blood, M.D.       DATE OF BIRTH:  10/19/1922   DATE OF ADMISSION:  10/24/2007  DATE OF DISCHARGE:  10/25/2007                               DISCHARGE SUMMARY   PRIMARY CARE PHYSICIAN:  Dr. Herb Grays.   DISCHARGE DIAGNOSES:  1. Severe emphysema with mild exacerbation.  2. Hypoxia.  3. Hypertension.  4. Hypothyroidism.  5. Right breast cancer status post mastectomy.   DISCHARGE MEDICATIONS:  1. Spiriva 1 capsule inhaled daily.  2. Albuterol MDI 1 puff 4 times a day as needed.  3. Prednisone taper.  4. Lasix 40 mg daily.  5. Potassium chloride 10 mEq daily.  6. Alprazolam 0.25 mg as needed for anxiety.  7. Zoloft 50 mg daily.  8. Lipitor 20 mg daily.   CONDITION ON DISCHARGE:  [name] was discharged in fair condition.  At the time of discharge, the patient's oxygen saturation was 91% on  room air.  The patient was told to follow up with Dr. Shan Levans  with [place] [facility] on November 15, 2007 at 10:45 a.m.  The  patient will also follow up with her primary care physician as needed.   PROCEDURE DURING THIS ADMISSION:  1. October 25, 2007, the patient underwent computer MUGA scan of the      chest without intravenous contrast, with findings of severe      emphysema.  2. October 25, 2007, the procedure is called spirometry.  Findings of      FVC of 42%, FEV 49%, total lung capacity 116% with good response to      bronchodilators.  3. Transthoracic echocardiogram.  results are pending   CONSULTATION:  No consultations obtained.   HISTORY AND PHYSICAL:  Refer to dictated H&P done by Dr. Lavera Guise, October 24, 2007.   HOSPITAL COURSE:  1. Hypoxia and dyspnea.  [name] presents to the emergency room      after she had subacute onset of shortness of breath and  was found      to have oxygen saturations in the high 80s in the emergency room.      The clinical examination as well as the clinical findings and the      History suggested possibility of emphysema.  The patient's history      though did not include tobacco abuse and/or exposure to chemicals      and dust.  To further evaluate the patient, we have obtained      pulmonary function tests as well as CT scan of the chest, and we      did identify severe emphysema with severe airway obstruction.  Ms.      Arruda was educated about her diagnosis, and she was started on      Spiriva and albuterol, and a prednisone taper.  The patient will      follow up with  Dr. Shan Levans for further treatment, monitoring      and diagnostic studies.  [name] has history of hypertension,      and we did pursue the possibility of a diastolic heart failure      exacerbation.  The patient is scheduled to undergo transthoracic      echocardiogram to measure the ejection fraction and the transmitral      Doppler parameters.  2. Hypothyroidism.  [name] TSH was checked and it is within      goals.  The dose of Synthroid has not been changed.  3. Depression/anxiety.  [name] Zoloft has been reduced at 50 mg      daily.  We have also added a low dose of alprazolam to control the      panic attacks.      Lonia Blood, M.D.  Electronically Signed     SL/MEDQ  D:  10/25/2007  T:  10/26/2007  Job:  [id number]   cc:   [name] R. Collins Scotland, M.D.  Charlcie Cradle Delford Field, MD, FCCP

## 2011-04-29 NOTE — Assessment & Plan Note (Signed)
Golds stage IV Copd oxygen dependent Plan Cont spiriva MAy increase oxygen to 3L rest prn Return 4 months

## 2011-04-29 NOTE — Assessment & Plan Note (Signed)
[facility]                             PULMONARY OFFICE NOTE   NAME:[name], [name] S                         MRN:          [id number]  DATE:11/15/2007                            DOB:          1922-08-12    An 77 year old white female referred for evaluation of emphysema, noting  shortness of breath over 2 weeks, noting some cough, short of breath  with exertion, short of breath at rest.  No real chest pain, no cough,  no wheezing. Short of breath walking 50 feet only.  She just started on  Spiriva, unsure if this has been helpful to her.  Had been on Norvasc  and hydrochlorothiazide, no recently changed to verapamil, furosemide,  and potassium.  She is referred for further evaluation of dyspnea. She  is a life-long never smoker but did have passive smoke exposure in the  past.   PAST MEDICAL HISTORY:   MEDICAL HISTORY:  1. Hypertension.  2. Emphysema.  3. Hypercholesterolemia.  4. Breast cancer on the right, removed in 1991.  5. Hysterectomy in 1975.  6. Appendectomy in 1947.   MEDICATION ALLERGIES:  CODEINE.   CURRENT MEDICATIONS:  1. Spiriva daily.  2. Verapamil 120 mg daily.  3. Levothyroxine 100 mg daily.  4. Sertraline 50 mg daily.  5. Lipitor 20 mg daily.  6. Potassium10 mEq daily.  7. Furosemide 40 mg daily.  8. Fosamax 70 mg weekly.  9. Felodipine 10 mg nightly.  10.ProAir p.r.n.  11.Alprazolam p.r.n.   PHYSICAL EXAMINATION:  GENERAL:  This is an obese female in no distress.  VITAL SIGNS:  Temperature 98, blood pressure 160/88, pulse 78,  saturation 94% on room air.  CHEST:  Showed distant breath sounds, a very prolonged expiratory phase.  No wheeze or rhonchi noted.  CARDIAC:  Exam showed a regular rate and rhythm without S3, normal  S1,  S2.  ABDOMEN:  Soft, nontender.  EXTREMITIES:  Showed no edema, clubbing, or venous disease.  SKIN:  Clear.  NEUROLOGIC:  Exam was intact.   LABORATORY DATA:  Pulmonary functions obtained  from October 25, 2007,  show an FEV1 of 0.78, FVC of 1.01, FEV1:FVC ratio of 77% predicted.  Total lung capacity 116% predicted. Diffusion capacity was not obtained  because of patient gagging.   CT scan of the chest was obtained and reviewed and revealed  emphysematous changes, right upper lobe scarring.  No pulmonary  embolism.  Right middle lobe nodule.   Laboratory data otherwise pending.   IMPRESSION:  Chronic obstructive lung disease with primary emphysematous  component.  There is no evidence of pulmonary embolism in this case.   RECOMMENDATIONS:  Discontinue Spiriva and begin Advair 250/50 one spray  b.i.d.  The patient was instructed as to its proper use.   I will see the patient back in return followup.     Charlcie Cradle Delford Field, MD, [facility]  Electronically Signed    PEW/MedQ  DD: 11/17/2007  DT: 11/17/2007  Job #: [id number]   cc:   [name], M.D.

## 2011-04-29 NOTE — Patient Instructions (Signed)
No change in medications. Return in         4 months 

## 2011-06-05 ENCOUNTER — Ambulatory Visit: Payer: Medicare Other | Admitting: Internal Medicine

## 2011-06-10 ENCOUNTER — Ambulatory Visit: Payer: Medicare Other | Admitting: Internal Medicine

## 2011-06-13 ENCOUNTER — Encounter: Payer: Self-pay | Admitting: Internal Medicine

## 2011-06-16 ENCOUNTER — Encounter: Payer: Self-pay | Admitting: Internal Medicine

## 2011-06-16 ENCOUNTER — Ambulatory Visit (INDEPENDENT_AMBULATORY_CARE_PROVIDER_SITE_OTHER): Payer: Medicare Other | Admitting: Internal Medicine

## 2011-06-16 DIAGNOSIS — R609 Edema, unspecified: Secondary | ICD-10-CM

## 2011-06-16 DIAGNOSIS — I509 Heart failure, unspecified: Secondary | ICD-10-CM

## 2011-06-16 DIAGNOSIS — I4892 Unspecified atrial flutter: Secondary | ICD-10-CM

## 2011-06-16 MED ORDER — VERAPAMIL HCL 120 MG PO TABS: ORAL_TABLET | ORAL | Status: DC

## 2011-06-16 MED ORDER — FUROSEMIDE 40 MG PO TABS: 40.0000 mg | ORAL_TABLET | Freq: Two times a day (BID) | ORAL | Status: DC

## 2011-06-16 MED ORDER — WARFARIN SODIUM 5 MG PO TABS: 5.0000 mg | ORAL_TABLET | Freq: Every day | ORAL | Status: DC

## 2011-06-16 MED ORDER — POTASSIUM CHLORIDE ER 10 MEQ PO TBCR: 10.0000 meq | EXTENDED_RELEASE_TABLET | Freq: Two times a day (BID) | ORAL | Status: DC

## 2011-06-16 NOTE — Progress Notes (Signed)
HPI  [name] is a 77 y.o. female seeen at the request of Dr Collins Scotland because she was identified with tachycardia.  She has longstanding COPD whicdh is O2 dependent.  She has no known vasuclar disease.  She has not had a problem with exercise tolerance but she is wheel chair bound   She has had some worsening of edema.    Thromboembolic risk factors incl Age, gender, htn and heart failure.  NO prior CVA  Pt has her short-term memory.   Past Medical History  Diagnosis Date  . COPD (chronic obstructive pulmonary disease)   . Hyperlipidemia   . Hypertension   . Breast cancer   . Osteoporosis   . Hypothyroid   . Anxiety and depression   . Memory loss   . Breast cancer     Past Surgical History  Procedure Date  . Mastectomy 1990    right  . Tonsillectomy   . Appendectomy   . Hysterectomy - unknown type     Current Outpatient Prescriptions  Medication Sig Dispense Refill  . albuterol (PROAIR HFA) 108 (90 BASE) MCG/ACT inhaler Inhale 2 puffs into the lungs every 4 (four) hours as needed.        . ALPRAZolam (XANAX) 0.25 MG tablet Take 0.25 mg by mouth at bedtime as needed.        Marland Kitchen aspirin 81 MG tablet Take 81 mg by mouth daily.        Marland Kitchen atorvastatin (LIPITOR) 20 MG tablet Take 60 mg by mouth daily.       . calcium-vitamin D (OSCAL) 250-125 MG-UNIT per tablet Take 1 tablet by mouth 2 (two) times daily.        . citalopram (CELEXA) 20 MG tablet 3 tabs once daily       . donepezil (ARICEPT) 10 MG tablet Take 10 mg by mouth daily.        . ferrous sulfate 325 (65 FE) MG tablet Take 325 mg by mouth 2 (two) times daily.        . furosemide (LASIX) 40 MG tablet Take 40 mg by mouth daily.        Marland Kitchen levothyroxine (SYNTHROID, LEVOTHROID) 100 MCG tablet Take 100 mcg by mouth daily.        Marland Kitchen lisinopril (PRINIVIL,ZESTRIL) 40 MG tablet Take 80 mg by mouth daily.       . potassium chloride (KLOR-CON) 10 MEQ CR tablet Take 10 mEq by mouth daily.        Marland Kitchen tiotropium (SPIRIVA) 18 MCG  inhalation capsule Place 1 capsule (18 mcg total) into inhaler and inhale daily.  30 capsule  6  . verapamil (CALAN) 120 MG tablet Take 120 mg by mouth daily.        Marland Kitchen VITAMIN D, CHOLECALCIFEROL, PO Take 1 capsule by mouth daily.        Marland Kitchen zolpidem (AMBIEN) 10 MG tablet Take 10 mg by mouth at bedtime as needed.        Marland Kitchen DISCONTD: verapamil (VERELAN PM) 240 MG 24 hr capsule Take 240 mg by mouth at bedtime.          Allergies  Allergen Reactions  . Codeine     REACTION: nausea    Review of Systems negative except from HPI and PMH anemia for which she takes iron  Physical Exam Well developed and obese elderly Caucasian female sitting in a wheelchair in no acute distress wearing oxygen HENT normal E scleral and icterus clear Neck  Supple JVP 8-9 cm of flutter waves; carotids brisk and full Clear to ausculation Rapid rate and rhythm, no murmurs gallops or rub Soft with active bowel sounds No clubbing cyanosis 3+ peripheral edemaAlert and oriented, grossly normal motor and sensory function Skin Warm and Dry  ECGatrial flutter with 21 conduction at a rate of 127 Intervals-/0.08/0.32 rare PVC Assessment and  Plan

## 2011-06-16 NOTE — Assessment & Plan Note (Signed)
The patient has atrial flutter which is asymptomatic as related to palpitations. It may well be contributing to the peripheral edema; however, the patient and her daughter-in-law are not sure of the duration of the edema. There is no clear change in exercise tolerance. The third issue with the atrial flutter is thromboembolic risk and she has a CHADS VASC score of 5. We have discussed the risks and benefits of anticoagulation therapy. Specifically the value of Coumadin in this elderly woman. We will plan to begin Coumadin.  We discussed the issues of bleeding not only centrally with hemorrhagic stroke but also with GI bleeding. She takes iron. This will need to be followed very carefully. I have reviewed this with Dr. Carney Living.  In addition, she will need further rate control. Her blood pressure is sufficiently elevated that we can add higher doses of rrate controlI am not altogether optimistic that we will be to control her flutter rate. It may well come to cardioversion.  I have had texting  conversation with her pulmonologist, Dr. Delford Field, who feels that she has with her severe COPD would be a poor candidate for sedation for catheter ablation. I would hope that cardioversion might be tolerable.  Plan to get an echo to assess for the possibility of tachycardia-induced cardiomyopathy

## 2011-06-16 NOTE — Assessment & Plan Note (Signed)
We'll plan to increase her diuretics from 40-40 twice daily. She will get her metabolic profile checked next week.

## 2011-06-16 NOTE — Patient Instructions (Signed)
Your physician has recommended you make the following change in your medication:  1) Increase lasix (furosemide) to 40mg  one tablet twice daily. 2) Increase potassium to one tablet twice daily. 3) Increase verapamil to 120mg  one tablet twice daily. 4) Start coumadin (warfarin) 5mg  one tablet daily on Friday.  You need to have an appointment to have your coumadin checked by Monday or Tuesday of next week at [facility].  Your physician recommends that you schedule a follow-up appointment in: 4 weeks  Your physician has requested that you have an echocardiogram 4 weeks (just prior to your followup appointment). Echocardiography is a painless test that uses sound waves to create images of your heart. It provides your doctor with information about the size and shape of your heart and how well your heart's chambers and valves are working. This procedure takes approximately one hour. There are no restrictions for this procedure.

## 2011-06-19 ENCOUNTER — Telehealth: Payer: Self-pay | Admitting: Internal Medicine

## 2011-06-19 NOTE — Telephone Encounter (Signed)
I spoke with the patient's daughter in law about her lasix. She wanted to know if they could do her lasix dose, both tablets in the morning, or take one about 11:00am and the other about 2:30pm. I explained whatever is the easiest for her.

## 2011-06-19 NOTE — Telephone Encounter (Signed)
Pt daughter has questions regarding times to take medications

## 2011-07-08 ENCOUNTER — Encounter: Payer: Self-pay | Admitting: *Deleted

## 2011-07-14 ENCOUNTER — Ambulatory Visit (INDEPENDENT_AMBULATORY_CARE_PROVIDER_SITE_OTHER): Payer: Medicare Other | Admitting: Internal Medicine

## 2011-07-14 ENCOUNTER — Ambulatory Visit (HOSPITAL_COMMUNITY): Payer: Medicare Other | Attending: Family Medicine | Admitting: Radiology

## 2011-07-14 ENCOUNTER — Encounter: Payer: Self-pay | Admitting: Internal Medicine

## 2011-07-14 DIAGNOSIS — E785 Hyperlipidemia, unspecified: Secondary | ICD-10-CM | POA: Insufficient documentation

## 2011-07-14 DIAGNOSIS — I4892 Unspecified atrial flutter: Secondary | ICD-10-CM

## 2011-07-14 DIAGNOSIS — I10 Essential (primary) hypertension: Secondary | ICD-10-CM | POA: Insufficient documentation

## 2011-07-14 DIAGNOSIS — J4489 Other specified chronic obstructive pulmonary disease: Secondary | ICD-10-CM | POA: Insufficient documentation

## 2011-07-14 DIAGNOSIS — R609 Edema, unspecified: Secondary | ICD-10-CM

## 2011-07-14 DIAGNOSIS — I509 Heart failure, unspecified: Secondary | ICD-10-CM | POA: Insufficient documentation

## 2011-07-14 DIAGNOSIS — J449 Chronic obstructive pulmonary disease, unspecified: Secondary | ICD-10-CM | POA: Insufficient documentation

## 2011-07-14 NOTE — Progress Notes (Signed)
HPI  [name] is a 77 y.o. female seeen In followup of atrial flutter identified a couple of weeks ago. It was largely asymptomatic but was associated with worsening of her peripheral edema.    She has longstanding COPD whicdh is O2 dependent; Dr. Delford Field feels she is not a candidate for catheter ablation.  Her INR became therapeutic last week.  Thromboembolic risk factors incl Age, gender, htn and heart failure.  NO prior CVA  Preliminary echo today demonstrates near normal left ventricular function   Past Medical History  Diagnosis Date  . COPD (chronic obstructive pulmonary disease)   . Hyperlipidemia   . Hypertension   . Breast cancer   . Osteoporosis   . Hypothyroid   . Anxiety and depression   . Memory loss   . Breast cancer   . Anemia     Presumed GI on iron replacement hemoglobin in the 10 range    Past Surgical History  Procedure Date  . Mastectomy 1990    right  . Tonsillectomy   . Appendectomy   . Hysterectomy - unknown type     Current Outpatient Prescriptions  Medication Sig Dispense Refill  . albuterol (PROAIR HFA) 108 (90 BASE) MCG/ACT inhaler Inhale 2 puffs into the lungs every 4 (four) hours as needed.        . ALPRAZolam (XANAX) 0.25 MG tablet Take 0.25 mg by mouth at bedtime as needed.        Marland Kitchen aspirin 81 MG tablet Take 81 mg by mouth daily.        Marland Kitchen atorvastatin (LIPITOR) 20 MG tablet Take 60 mg by mouth daily.       . calcium-vitamin D (OSCAL) 250-125 MG-UNIT per tablet Take 1 tablet by mouth 2 (two) times daily.        . citalopram (CELEXA) 20 MG tablet 3 tabs once daily       . donepezil (ARICEPT) 10 MG tablet Take 10 mg by mouth daily.        . ferrous sulfate 325 (65 FE) MG tablet Take 325 mg by mouth 2 (two) times daily.        . furosemide (LASIX) 40 MG tablet Take 1 tablet (40 mg total) by mouth 2 (two) times daily.  60 tablet  6  . levothyroxine (SYNTHROID, LEVOTHROID) 100 MCG tablet Take 100 mcg by mouth daily.        Marland Kitchen lisinopril  (PRINIVIL,ZESTRIL) 40 MG tablet Take 80 mg by mouth daily.       . potassium chloride (K-DUR) 10 MEQ tablet Take 1 tablet (10 mEq total) by mouth 2 (two) times daily.  60 tablet  6  . tiotropium (SPIRIVA) 18 MCG inhalation capsule Place 1 capsule (18 mcg total) into inhaler and inhale daily.  30 capsule  6  . verapamil (CALAN) 120 MG tablet Take one tablet by mouth twice daily.  60 tablet  6  . VITAMIN D, CHOLECALCIFEROL, PO Take 1 capsule by mouth daily.        Marland Kitchen warfarin (COUMADIN) 5 MG tablet Take 1 tablet (5 mg total) by mouth daily.  30 tablet  3  . zolpidem (AMBIEN) 10 MG tablet Take 10 mg by mouth at bedtime as needed.          Allergies  Allergen Reactions  . Codeine     REACTION: nausea    Review of Systems negative except from HPI and PMH anemia for which she takes iron  Physical Exam  Well developed and obese elderly Caucasian female sitting in a wheelchair in no acute distress wearing oxygen HENT normal E scleral and icterus clear Neck Supple JVP 8-9 cm of flutter waves; carotids brisk and full Clear to ausculation Decreased breath sounds  Irregular rate and rhythm, no murmurs gallops or rub Soft with active bowel sounds No clubbing cyanosis 3+ peripheral edema  Alert and oriented, grossly normal motor and sensory function Skin Warm and Dry  ECG Atrial fib flutter with a rate of 93 Intervals-/0.08/0.32 rare PVC Assessment and  Plan

## 2011-07-14 NOTE — Assessment & Plan Note (Signed)
Re: control is improved. We'll continue her on her current medications. In the absence of symptoms and the absence of evidence of cardiomyopathy we will not pursue further therapy. She will continue her warfarin. We will discontinue her aspirin

## 2011-07-14 NOTE — Assessment & Plan Note (Signed)
Continue current diuretic regime

## 2011-07-14 NOTE — Patient Instructions (Signed)
Your physician has recommended you make the following change in your medication:  1) Stop Aspirin.  We will see you back on an as needed basis.  Marland Kitchen

## 2011-08-05 ENCOUNTER — Encounter: Payer: Self-pay | Admitting: Internal Medicine

## 2011-08-11 ENCOUNTER — Telehealth: Payer: Self-pay | Admitting: Internal Medicine

## 2011-08-11 ENCOUNTER — Encounter: Payer: Self-pay | Admitting: Internal Medicine

## 2011-08-11 NOTE — Telephone Encounter (Signed)
SPOKE WITH DR SPEAR PT'S PROTTIME IS FOLLOWED  AT THIS OFF  HR  IS CONSISTENTLY RUNNING HIGH NEEDS F/U  PER DR SPEAR  WITH DR Graciela Husbands NONE SCHEDULED AT THIS TIME . LEFT MESSAGE FOR PT  TO CALL BACK NEEDS  F/U WITH DR KLEIN./CY

## 2011-08-11 NOTE — Telephone Encounter (Signed)
Tiffany would like to talk to the nurse re pt heart rate 125.

## 2011-08-15 NOTE — Telephone Encounter (Signed)
Patient has an appointment on 9/14 with Tereso Newcomer, PA.

## 2011-08-29 ENCOUNTER — Encounter: Payer: Self-pay | Admitting: Physician Assistant

## 2011-08-29 ENCOUNTER — Ambulatory Visit (INDEPENDENT_AMBULATORY_CARE_PROVIDER_SITE_OTHER): Payer: Medicare Other | Admitting: Physician Assistant

## 2011-08-29 VITALS — BP 102/72 | HR 103 | Ht 64.5 in

## 2011-08-29 DIAGNOSIS — I10 Essential (primary) hypertension: Secondary | ICD-10-CM

## 2011-08-29 DIAGNOSIS — R609 Edema, unspecified: Secondary | ICD-10-CM

## 2011-08-29 DIAGNOSIS — I4892 Unspecified atrial flutter: Secondary | ICD-10-CM

## 2011-08-29 MED ORDER — VERAPAMIL HCL ER 180 MG PO TBCR: 180.0000 mg | EXTENDED_RELEASE_TABLET | Freq: Two times a day (BID) | ORAL | Status: DC

## 2011-08-29 MED ORDER — LISINOPRIL 40 MG PO TABS: 40.0000 mg | ORAL_TABLET | Freq: Every day | ORAL | Status: DC

## 2011-08-29 NOTE — Assessment & Plan Note (Signed)
Controlled.  Adjust medications as noted.

## 2011-08-29 NOTE — Assessment & Plan Note (Addendum)
Her heart rate is still somewhat elevated.  Again, she remains asymptomatic with this.  I had a long discussion with her regarding adjusting her verapamil versus adding digoxin.  I would like to try to increase her verapamil first.  I will cut back her lisinopril to 40 mg a day.  I will increase her verapamil to 180 mg b.i.d.  She will follow up with me in 3-4 weeks.  She continues to have her Coumadin followed by her PCP.

## 2011-08-29 NOTE — Progress Notes (Signed)
History of Present Illness: Primary Electrophysiologist:  Dr. Sherryl Manges  [name] is a 77 y.o. female who presents for follow up on atrial flutter.  She saw Dr. Graciela Husbands initially 7/3.  It was felt that her atrial flutter was largely asymptomatic.  It was thought that it may be contributing to her peripheral edema.  It was felt that she had significant thromboembolic risk factors and she was placed on Coumadin.  Given her significant COPD, her calcium channel blocker was up titrated for rate control and a beta blocker was avoided.  It was also felt that she was not a candidate for ablation due to her COPD.  An echo was done 7/30:  EF 50% to 55%. Wall motion was normal ;there were no regional wall motion abnormalities, mild AS, MAC, mild LAE, mild RAE, PASP 35, mild pulmonary HTN.  Her Lasix was also increased with improvement with volume management.  She followed up with Dr. Graciela Husbands 7/30.  Her rate was controlled.  In the absence of symptoms, it was not felt that she needed further cardiology followup.  She recently saw her PCP for Coumadin management.  It was noted that she was tachycardic and she was referred back for followup.  Labs 08/12/11: Potassium 4.3, creatinine 0.96, ALT 9, TSH 0.738, hemoglobin 11.1.  Her heart rate was noted to be in the 120s when she was seen for Coumadin followup 8/27 and was referred back for evaluation.  The patient denies palpitations.  She denies chest pain.  She has chronic dyspnea with her COPD that is unchanged.  She also has fairly chronic pedal edema.  This is stable.  She denies orthopnea, PND.  She denies syncope.    Past Medical History  Diagnosis Date  . COPD (chronic obstructive pulmonary disease)   . Hyperlipidemia   . Hypertension   . Breast cancer   . Osteoporosis   . Hypothyroid   . Anxiety and depression   . Memory loss   . Breast cancer   . Anemia     Presumed GI on iron replacement hemoglobin in the 10 range  . Atrial flutter     Coumadin  initiated 7/12 and followed by PCP; patient felt to be high risk for RFCA secondary to COPD    Current Outpatient Prescriptions  Medication Sig Dispense Refill  . albuterol (PROAIR HFA) 108 (90 BASE) MCG/ACT inhaler Inhale 2 puffs into the lungs every 4 (four) hours as needed.        . ALPRAZolam (XANAX) 0.25 MG tablet Take 0.25 mg by mouth at bedtime as needed.        Marland Kitchen atorvastatin (LIPITOR) 20 MG tablet Take 60 mg by mouth daily.       . calcium-vitamin D (OSCAL) 250-125 MG-UNIT per tablet Take 1 tablet by mouth 2 (two) times daily.        . citalopram (CELEXA) 20 MG tablet 3 tabs once daily       . donepezil (ARICEPT) 10 MG tablet Take 10 mg by mouth daily.        . ferrous sulfate 325 (65 FE) MG tablet Take 325 mg by mouth 2 (two) times daily.        . furosemide (LASIX) 40 MG tablet Take 1 tablet (40 mg total) by mouth 2 (two) times daily.  60 tablet  6  . levothyroxine (SYNTHROID, LEVOTHROID) 100 MCG tablet Take 100 mcg by mouth daily.        Marland Kitchen lisinopril (PRINIVIL,ZESTRIL) 40  MG tablet Take 80 mg by mouth daily.       . potassium chloride (K-DUR) 10 MEQ tablet Take 1 tablet (10 mEq total) by mouth 2 (two) times daily.  60 tablet  6  . tiotropium (SPIRIVA) 18 MCG inhalation capsule Place 1 capsule (18 mcg total) into inhaler and inhale daily.  30 capsule  6  . verapamil (CALAN) 120 MG tablet Take one tablet by mouth twice daily.  60 tablet  6  . VITAMIN D, CHOLECALCIFEROL, PO Take 1 capsule by mouth daily.        Marland Kitchen warfarin (COUMADIN) 5 MG tablet Take 1 tablet (5 mg total) by mouth daily.  30 tablet  3  . zolpidem (AMBIEN) 10 MG tablet Take 10 mg by mouth at bedtime as needed.          Allergies: Allergies  Allergen Reactions  . Codeine     REACTION: nausea    Vital Signs: BP 102/72  Pulse 103  Ht 5' 4.5" (1.638 m)  PHYSICAL EXAM: Chronically ill-appearing female sitting in a wheelchair on oxygen in no acute distress HEENT: normal Neck: no JVD At 90 degrees Cardiac:   normal S1, S2; Irregularly irregular; no murmur Lungs:  clear to auscultation bilaterally, no wheezing, rhonchi or rales Abd: soft, nontender Ext: Tight 1+ bilateral edema Skin: warm and dry Neuro:  CNs 2-12 intact, no focal abnormalities noted  EKG:  Atrial flutter, heart rate 103, variable AV block, normal axis, nonspecific ST-T wave changes, PVC  ASSESSMENT AND PLAN:

## 2011-08-29 NOTE — Assessment & Plan Note (Signed)
--   Stable.  Continue Lasix. 

## 2011-08-29 NOTE — Patient Instructions (Signed)
Your physician recommends that you schedule a follow-up appointment in: 09/30/11 @ 11:00 AM WITH [address] PA-C.  Your physician has recommended you make the following change in your medication: DECREASE LISINOPRIL TO 40 MG DAILY; INCREASE VERAPAMIL TO 180 MG TWICE DAILY NEW PRESCRIPTIONS HAVE BEEN SENT TO [facility] IN [place] TODAY.

## 2011-09-01 ENCOUNTER — Ambulatory Visit: Payer: Medicare Other | Admitting: Critical Care Medicine

## 2011-09-02 ENCOUNTER — Ambulatory Visit (INDEPENDENT_AMBULATORY_CARE_PROVIDER_SITE_OTHER): Payer: Medicare Other | Admitting: Critical Care Medicine

## 2011-09-02 ENCOUNTER — Ambulatory Visit (INDEPENDENT_AMBULATORY_CARE_PROVIDER_SITE_OTHER)
Admission: RE | Admit: 2011-09-02 | Discharge: 2011-09-02 | Disposition: A | Discharge status: Home / Self Care | Payer: Medicare Other | Source: Ambulatory Visit | Attending: Critical Care Medicine | Admitting: Critical Care Medicine

## 2011-09-02 ENCOUNTER — Encounter: Payer: Self-pay | Admitting: Critical Care Medicine

## 2011-09-02 VITALS — BP 130/72 | HR 123 | Temp 98.3°F | Ht 64.0 in | Wt 193.8 lb

## 2011-09-02 DIAGNOSIS — J449 Chronic obstructive pulmonary disease, unspecified: Secondary | ICD-10-CM

## 2011-09-02 NOTE — Assessment & Plan Note (Addendum)
Copd exacerbation with hypoxemia Note CXR shows chronic lung disease, no active process Plan Pulse prednisone Increase oxygen to 3L rest 4L exertion

## 2011-09-02 NOTE — Progress Notes (Signed)
Subjective:    Patient ID: [name], female    DOB: [birth year], 77 y.o.   MRN: [id number]  HPI  This is an 77 y.o. white female with chronic obstructive lung disease. Primary emphysematous component.  5/15 Doing ok since last ov.  Energy levels low. Spends time going bedroom to chair and sits in chair all the time.  Has a caretaker.  Stays in the home still.  Periods of dyspnea.    09/02/2011 Pt progressing with more dyspnea, cough, weakness, BP meds adjusted since last ov. Sats are lower  Past Medical History  Diagnosis Date  . COPD (chronic obstructive pulmonary disease)   . Hyperlipidemia   . Hypertension   . Breast cancer   . Osteoporosis   . Hypothyroid   . Anxiety and depression   . Memory loss   . Breast cancer   . Anemia     Presumed GI on iron replacement hemoglobin in the 10 range  . Atrial flutter     Coumadin initiated 7/12 and followed by PCP; patient felt to be high risk for RFCA secondary to COPD;  echo was done 7/30:  EF 50% to 55%. Wall motion was normal ;there were no regional wall motion abnormalities, mild AS, MAC, mild LAE, mild RAE, PASP 35, mild pulmonary HTN     Family History  Problem Relation Age of Onset  . Emphysema    . Heart attack    . Diabetes    . Hypertension    . Cancer    . Anxiety disorder    . Depression    . Coronary artery disease       History   Social History  . Marital Status: Married    Spouse Name: N/A    Number of Children: N/A  . Years of Education: N/A   Occupational History  . Not on file.   Social History Main Topics  . Smoking status: Never Smoker   . Smokeless tobacco: Never Used  . Alcohol Use: No  . Drug Use: No  . Sexually Active: Not on file   Other Topics Concern  . Not on file   Social History Narrative  . No narrative on file     Allergies  Allergen Reactions  . Codeine     REACTION: nausea     Outpatient Prescriptions Prior to Visit  Medication Sig Dispense Refill  . albuterol  (PROAIR HFA) 108 (90 BASE) MCG/ACT inhaler Inhale 2 puffs into the lungs every 4 (four) hours as needed.        . ALPRAZolam (XANAX) 0.25 MG tablet Take 0.25 mg by mouth at bedtime as needed.        Marland Kitchen atorvastatin (LIPITOR) 20 MG tablet Take 20 mg by mouth daily.       . citalopram (CELEXA) 20 MG tablet 3 tabs once daily       . donepezil (ARICEPT) 10 MG tablet Take 10 mg by mouth daily.        . ferrous sulfate 325 (65 FE) MG tablet Take 325 mg by mouth 2 (two) times daily.        . furosemide (LASIX) 40 MG tablet Take 1 tablet (40 mg total) by mouth 2 (two) times daily.  60 tablet  6  . levothyroxine (SYNTHROID, LEVOTHROID) 100 MCG tablet Take 100 mcg by mouth daily.        Marland Kitchen lisinopril (PRINIVIL,ZESTRIL) 40 MG tablet Take 1 tablet (40 mg total) by mouth daily.  30 tablet  11  . potassium chloride (K-DUR) 10 MEQ tablet Take 1 tablet (10 mEq total) by mouth 2 (two) times daily.  60 tablet  6  . tiotropium (SPIRIVA) 18 MCG inhalation capsule Place 1 capsule (18 mcg total) into inhaler and inhale daily.  30 capsule  6  . VITAMIN D, CHOLECALCIFEROL, PO Take 1 capsule by mouth daily.        Marland Kitchen warfarin (COUMADIN) 5 MG tablet Take 1 tablet (5 mg total) by mouth daily.  30 tablet  3  . zolpidem (AMBIEN) 10 MG tablet Take 10 mg by mouth at bedtime as needed.        . verapamil (CALAN-SR) 180 MG CR tablet Take 1 tablet (180 mg total) by mouth 2 (two) times daily.  30 tablet  11  . calcium-vitamin D (OSCAL) 250-125 MG-UNIT per tablet Take 1 tablet by mouth 2 (two) times daily.           Review of Systems  Respiratory: Positive for cough, shortness of breath and wheezing.   Cardiovascular: Negative for chest pain.   Constitutional:   No  weight loss, night sweats,  Fevers, chills, fatigue, lassitude. HEENT:   No headaches,  Difficulty swallowing,  Tooth/dental problems,  Sore throat,                No sneezing, itching, ear ache, nasal congestion, post nasal drip,   CV:  No chest pain,  Orthopnea,  PND, swelling in lower extremities, anasarca, dizziness, palpitations  GI  No heartburn, indigestion, abdominal pain, nausea, vomiting, diarrhea, change in bowel habits, loss of appetite  Resp: Notes  shortness of breath with exertion not at rest.  No excess mucus, no productive cough,  No non-productive cough,  No coughing up of blood.  No change in color of mucus.  No wheezing.  No chest wall deformity  Skin: no rash or lesions.  GU: no dysuria, change in color of urine, no urgency or frequency.  No flank pain.  MS:  No joint pain or swelling.  No decreased range of motion.  No back pain.  Psych:  No change in mood or affect. No depression or anxiety.  No memory loss.     Objective:   Physical Exam  Filed Vitals:   09/02/11 1649 09/02/11 1650  BP: 130/72   Pulse: 123   Temp: 98.3 F (36.8 C)   TempSrc: Oral   Height: 5\' 4"  (1.626 m)   Weight: 193 lb 12.8 oz (87.907 kg)   SpO2: 87% 93%    Gen: Pleasant,obese ,elderly WF , in no distress,  normal affect  ENT: No lesions,  mouth clear,  oropharynx clear, no postnasal drip  Neck: No JVD, no TMG, no carotid bruits  Lungs: No use of accessory muscles, no dullness to percussion, distant BS  Cardiovascular: RRR, heart sounds normal, no murmur or gallops, no peripheral edema  Abdomen: soft and NT, no HSM,  BS normal  Musculoskeletal: No deformities, no cyanosis or clubbing  Neuro: alert, non focal  Skin: Warm, no lesions or rashes       CXR: no active disease  Assessment & Plan:   C O P D Copd exacerbation with hypoxemia Note CXR shows chronic lung disease, no active process Plan Pulse prednisone Increase oxygen to 3L rest 4L exertion      Updated Medication List Outpatient Encounter Prescriptions as of 09/02/2011  Medication Sig Dispense Refill  . albuterol (PROAIR HFA) 108 (90 BASE) MCG/ACT  inhaler Inhale 2 puffs into the lungs every 4 (four) hours as needed.        . ALPRAZolam (XANAX) 0.25 MG tablet  Take 0.25 mg by mouth at bedtime as needed.        Marland Kitchen atorvastatin (LIPITOR) 20 MG tablet Take 20 mg by mouth daily.       . calcium carbonate (OS-CAL) 600 MG TABS Take 600 mg by mouth daily.        . citalopram (CELEXA) 20 MG tablet 3 tabs once daily       . donepezil (ARICEPT) 10 MG tablet Take 10 mg by mouth daily.        . ferrous sulfate 325 (65 FE) MG tablet Take 325 mg by mouth 2 (two) times daily.        . furosemide (LASIX) 40 MG tablet Take 1 tablet (40 mg total) by mouth 2 (two) times daily.  60 tablet  6  . levothyroxine (SYNTHROID, LEVOTHROID) 100 MCG tablet Take 100 mcg by mouth daily.        Marland Kitchen lisinopril (PRINIVIL,ZESTRIL) 40 MG tablet Take 1 tablet (40 mg total) by mouth daily.  30 tablet  11  . potassium chloride (K-DUR) 10 MEQ tablet Take 1 tablet (10 mEq total) by mouth 2 (two) times daily.  60 tablet  6  . tiotropium (SPIRIVA) 18 MCG inhalation capsule Place 1 capsule (18 mcg total) into inhaler and inhale daily.  30 capsule  6  . verapamil (CALAN) 120 MG tablet Take 120 mg by mouth daily.        . verapamil (CALAN-SR) 180 MG CR tablet Take 180 mg by mouth at bedtime.        Marland Kitchen VITAMIN D, CHOLECALCIFEROL, PO Take 1 capsule by mouth daily.        Marland Kitchen warfarin (COUMADIN) 5 MG tablet Take 1 tablet (5 mg total) by mouth daily.  30 tablet  3  . zolpidem (AMBIEN) 10 MG tablet Take 10 mg by mouth at bedtime as needed.        Marland Kitchen DISCONTD: verapamil (CALAN-SR) 180 MG CR tablet Take 1 tablet (180 mg total) by mouth 2 (two) times daily.  30 tablet  11  . DISCONTD: calcium-vitamin D (OSCAL) 250-125 MG-UNIT per tablet Take 1 tablet by mouth 2 (two) times daily.

## 2011-09-09 ENCOUNTER — Emergency Department (HOSPITAL_COMMUNITY): Payer: Medicare Other

## 2011-09-09 ENCOUNTER — Emergency Department (HOSPITAL_COMMUNITY)
Admission: EM | Admit: 2011-09-09 | Discharge: 2011-09-09 | Disposition: A | Discharge status: Home / Self Care | Payer: Medicare Other | Attending: Emergency Medicine | Admitting: Emergency Medicine

## 2011-09-09 DIAGNOSIS — Z79899 Other long term (current) drug therapy: Secondary | ICD-10-CM | POA: Insufficient documentation

## 2011-09-09 DIAGNOSIS — J438 Other emphysema: Secondary | ICD-10-CM | POA: Insufficient documentation

## 2011-09-09 DIAGNOSIS — I4892 Unspecified atrial flutter: Secondary | ICD-10-CM | POA: Insufficient documentation

## 2011-09-09 DIAGNOSIS — Z9981 Dependence on supplemental oxygen: Secondary | ICD-10-CM | POA: Insufficient documentation

## 2011-09-09 DIAGNOSIS — E039 Hypothyroidism, unspecified: Secondary | ICD-10-CM | POA: Insufficient documentation

## 2011-09-09 DIAGNOSIS — I10 Essential (primary) hypertension: Secondary | ICD-10-CM | POA: Insufficient documentation

## 2011-09-09 LAB — COMPREHENSIVE METABOLIC PANEL
ALT: 16 U/L (ref 0–35)
Albumin: 3.6 g/dL (ref 3.5–5.2)
Alkaline Phosphatase: 56 U/L (ref 39–117)
Chloride: 91 mEq/L — ABNORMAL LOW (ref 96–112)
Potassium: 3.1 mEq/L — ABNORMAL LOW (ref 3.5–5.1)
Sodium: 137 mEq/L (ref 135–145)
Total Bilirubin: 0.7 mg/dL (ref 0.3–1.2)
Total Protein: 6.2 g/dL (ref 6.0–8.3)

## 2011-09-09 LAB — DIFFERENTIAL
Basophils Relative: 0 % (ref 0–1)
Eosinophils Absolute: 0.1 10*3/uL (ref 0.0–0.7)
Lymphs Abs: 1.3 10*3/uL (ref 0.7–4.0)
Monocytes Absolute: 1.5 10*3/uL — ABNORMAL HIGH (ref 0.1–1.0)
Monocytes Relative: 12 % (ref 3–12)
Neutro Abs: 9.4 10*3/uL — ABNORMAL HIGH (ref 1.7–7.7)
Neutrophils Relative %: 77 % (ref 43–77)

## 2011-09-09 LAB — URINALYSIS, ROUTINE W REFLEX MICROSCOPIC
Bilirubin Urine: NEGATIVE
Glucose, UA: NEGATIVE mg/dL
Hgb urine dipstick: NEGATIVE
Ketones, ur: NEGATIVE mg/dL
Specific Gravity, Urine: 1.009 (ref 1.005–1.030)
pH: 8 (ref 5.0–8.0)

## 2011-09-09 LAB — CBC
Hemoglobin: 12.5 g/dL (ref 12.0–15.0)
MCH: 29.8 pg (ref 26.0–34.0)
MCHC: 32.1 g/dL (ref 30.0–36.0)
MCV: 92.8 fL (ref 78.0–100.0)
RBC: 4.19 MIL/uL (ref 3.87–5.11)

## 2011-09-11 LAB — URINE CULTURE
Colony Count: 35000
Culture  Setup Time: 201209251825

## 2011-09-12 ENCOUNTER — Encounter: Payer: Self-pay | Admitting: Physician Assistant

## 2011-09-17 ENCOUNTER — Ambulatory Visit: Payer: Medicare Other | Admitting: Critical Care Medicine

## 2011-09-23 ENCOUNTER — Encounter: Payer: Self-pay | Admitting: Critical Care Medicine

## 2011-09-23 ENCOUNTER — Ambulatory Visit (INDEPENDENT_AMBULATORY_CARE_PROVIDER_SITE_OTHER): Payer: Medicare Other | Admitting: Critical Care Medicine

## 2011-09-23 DIAGNOSIS — J449 Chronic obstructive pulmonary disease, unspecified: Secondary | ICD-10-CM

## 2011-09-23 LAB — DIFFERENTIAL
Eosinophils Relative: 0
Lymphocytes Relative: 6 — ABNORMAL LOW
Lymphs Abs: 0.6 — ABNORMAL LOW
Monocytes Relative: 6
Neutrophils Relative %: 87 — ABNORMAL HIGH

## 2011-09-23 LAB — COMPREHENSIVE METABOLIC PANEL
AST: 17
CO2: 28
Calcium: 8.6
Creatinine, Ser: 0.62
GFR calc Af Amer: >60
GFR calc non Af Amer: >60
Total Protein: 6.6

## 2011-09-23 LAB — POCT I-STAT 3, ART BLOOD GAS (G3+)
Acid-Base Excess: 5 — ABNORMAL HIGH
Bicarbonate: 30.4 — ABNORMAL HIGH
Patient temperature: 98.5
TCO2: 32

## 2011-09-23 LAB — PROTIME-INR: Prothrombin Time: 14.2

## 2011-09-23 LAB — CBC
MCHC: 33.6
MCV: 90.2
RBC: 4.23
RDW: 13.3

## 2011-09-23 LAB — D-DIMER, QUANTITATIVE: D-Dimer, Quant: 0.32

## 2011-09-23 LAB — POCT CARDIAC MARKERS
Myoglobin, poc: 102
Operator id: 257131

## 2011-09-23 NOTE — Consult Note (Signed)
NAMEMarshe, [name]                  ACCOUNT NO.:  [id number]  MEDICAL RECORD NO.:  [id number]  LOCATION:  [name]                         FACILITY:  [name]  PHYSICIAN:  [name] Pick. Eden Emms, MD, FACCDATE OF BIRTH:  [birth year]  DATE OF CONSULTATION:  09/09/2011 DATE OF DISCHARGE:  09/09/2011                                CONSULTATION   PRIMARY CARDIOLOGIST:  [name] Salvia, MD, [name]  PRIMARY CARE PROVIDER:  Bayard Beaver. Collins Scotland, MD  PRIMARY PULMONOLOGIST:  [name] Cradle. Delford Field, MD, [facility]  PATIENT PROFILE:  An 77 year old female with history of atrial flutter, on Coumadin since July 2012, who has been rate controlled on verapamil who presented to the ED with asymptomatic rapid atrial flutter.  PROBLEM LIST: 1. Atrial flutter.     a.     On Coumadin since July 2012.     b.     July 14, 2011, 2D echocardiogram, EF 50-55% with normal wall      motion.  Mild aortic stenosis.  Mildly dilated left atrium/right      atrium.  PASP 36 mmHg. 2. Severe chronic obstructive pulmonary disease/emphysema followed by     [name].     a.     Currently on prednisone taper since September 02, 2011. 3. Hypertension. 4. Hypothyroidism. 5. Right breast cancer status post mastectomy.  ALLERGIES:  No known drug allergies.  HISTORY OF PRESENT ILLNESS:  An 77 year old female with a history of atrial flutter, on Coumadin since July 2012.  Due to severe emphysema, the patient is not felt to be a good candidate for radiofrequency catheter ablation and therefore has been rate controlled on verapamil therapy.  Since July, she has been noted on several occasions to have a rates greater than 100 and was seen by PCP and she has been followed up at our office last time on August 29, 2011, and verapamil was adjusted to 180 mg b.i.d.  Of note, she is asymptomatic, in atrial flutter regardless of rate, and denies any prior history of chest pain, dyspnea, presyncope, syncope, or palpitations.  The patient was seen in  Pulmonology office on September 02, 2011, with complaints of dyspnea and was found to have acute decompensation of COPD and was subsequently placed on a prednisone taper.  Today, she was seen by her primary care provider for routine followup with plan just to refill some medications and she was noted to have heart rate in the 130s.  An ECG was performed showing atrial flutter in the 130s.  The patient was asymptomatic.  Decision was made to send her to the ED for further cardiac evaluation.  She is completely asymptomatic and her vital signs are otherwise stable.  ECG is nonacute and cardiac enzymes are negative.  She was noted to be hypokalemic with potassium of 3.1 which is being supplemented.  HOME MEDICATIONS: 1. Albuterol inhaler 108 mcg 2 puffs q.4 hours p.r.n. - rarely used. 2. Xanax 0.25 mg at bedtime p.r.n. 3. Lipitor 20 mg 3 tablets daily. 4. Calcium plus D 250/125 mg b.i.d. 5. Celexa 20 mg 3 tablets once a day. 6. Aricept 10 mg daily. 7. Ferrous sulfate 325 mg b.i.d. 8. Furosemide  40 mg b.i.d. 9. Synthroid 100 mcg daily. 10.Lisinopril 80 mg daily. 11.Potassium chloride 10 mEq b.i.d. 12.Spiriva 18 mcg inhaled daily. 13.Verapamil 180 mg b.i.d. 14.Vitamin D daily. 15.Coumadin as directed. 16.Ambien 10 mg at bedtime p.r.n. 17.Prednisone taper.  FAMILY HISTORY:  Mother died at 30 with history of MI and CHF.  Father died in his late 64s of an MI.  She has total of 5 siblings including 4 sisters and 1 brother.  SOCIAL HISTORY:  The patient lives in [place] by herself, although her son and daughter-in-law live the next door.  She is retired Furniture conservator/restorer.  She denies tobacco, alcohol, or drug use.  Does not routinely exercise.  REVIEW OF SYSTEMS:  Positive for dyspnea on exertion.  Otherwise, all systems reviewed and negative.  She is a full code.  PHYSICAL EXAMINATION:  VITAL SIGNS:  Temperature 98.4, heart rate 135, respirations 20, blood pressure  107/76, pulse ox 100% on room air. GENERAL:  Pleasant white female, in no acute distress, awake, alert, and oriented x3.  She has a normal affect. HEENT:  Normal. NEUROLOGIC:  Grossly intact.  Nonfocal. SKIN:  Warm and dry without lesions or masses. NECK:  Supple without bruits, JVD. LUNGS:  Respirations are regular and unlabored with diminished breath sounds throughout, more markedly so in the bases bilaterally. CARDIAC:  Regular, tachycardic.  S1 and S2.  No S3, S4, or murmurs. ABDOMEN:  Round, soft, nontender, nondistended.  Bowel sounds present x4. EXTREMITIES:  Warm, dry, and pink.  No clubbing or cyanosis.  Trace bilateral lower extremity edema.  Dorsalis pedis pulses are 2+ and equal bilaterally.  Chest x-ray shows irregular area of increased density in the right-to- mid zone, question scarring versus mass with recommendation for followup.  EKG shows atrial flutter, rate of 130 with slight inferior ST depression and normal axis.  No acute changes.  Hemoglobin 12.5, hematocrit 38.9, WBC 12.2, platelets 219.  Sodium 137, potassium 3.1, chloride 111, CO2 39, BUN 16, creatinine 1.14, glucose 99.  AST 18, albumin 3.6, troponin less than 0.30, INR 2.44.  ASSESSMENT AND PLAN: 1. Atrial flutter with rapid ventricular response.  The patient has     difficulty with rate control in the setting of severe lung disease,     currently requiring prednisone taper.  The patient is completely     asymptomatic.  We will give her a dose of digoxin 0.125 mg IV x1     now and initiate 0.125 mg p.o. daily.  We will continue verapamil     at its current dose of 180 mg b.i.d..  She is not an ideal     candidate for beta-blockade secondary to severe emphysema.  Her INR     is therapeutic and this is followed by [name].  The patient felt     to be safe for discharge from the ED today.  Case has been     discussed with [name] as well. 2. Right mid lung scarring versus mass.  We will obtain a  noncontrast     CT of the chest to better objectify while the patient is here in     the ED with additional followup with [name] planned. 3. Hypokalemia.  We will supplement here in the ED and we will     increase her home dose to 20 mEq b.i.d. 4. Hypertension, stable. 5. Hypothyroidism.  The patient is on a stable home dose of Synthroid. 6. Severe COPD/emphysema.  The patient is  currently on prednisone     taper, is not actively wheezing, but has diminished breath sounds     bilaterally.  This is followed by [name].  Thank you for the following this patient.     Nicolasa Ducking, ANP   ______________________________ [name] Pick. Eden Emms, MD, [facility]    CB/MEDQ  D:  09/09/2011  T:  09/09/2011  Job:  [id number]  Electronically Signed by Nicolasa Ducking ANP on 09/16/2011 06:08:51 PM Electronically Signed by Charlton Haws MD [facility] on 09/23/2011 07:25:40 PM

## 2011-09-23 NOTE — Patient Instructions (Signed)
No change in medications. Return in         4 months 

## 2011-09-23 NOTE — Progress Notes (Signed)
Subjective:    Patient ID: [name], female    DOB: 19-Apr-1922, 77 y.o.   MRN: [id number]  HPI  This is an 77 y.o. white female with chronic obstructive lung disease. Primary emphysematous component.   09/23/2011 At last ov we increased O2 to 3L rest and we gave pulse prednisone.  No more wheezing since took pred No cough, no congestion or mucus.  No real chest pain.  No edema in feet.  Since the last office visit the patient has been to the emergency room on September 25. At this visit digoxin was added. The patient was in atrial flutter with rapid ventricular response. Previous to this emergency room visit the patient had verapamil dosage increased and lisinopril dosage decreased on 08/29/2011 cardiology office visit.  Past Medical History  Diagnosis Date  . COPD (chronic obstructive pulmonary disease)   . Hyperlipidemia   . Hypertension   . Breast cancer   . Osteoporosis   . Hypothyroid   . Anxiety and depression   . Memory loss   . Breast cancer   . Anemia     Presumed GI on iron replacement hemoglobin in the 10 range  . Atrial flutter     Coumadin initiated 7/12 and followed by PCP; patient felt to be high risk for RFCA secondary to COPD;  echo was done 7/30:  EF 50% to 55%. Wall motion was normal ;there were no regional wall motion abnormalities, mild AS, MAC, mild LAE, mild RAE, PASP 35, mild pulmonary HTN     Family History  Problem Relation Age of Onset  . Emphysema    . Heart attack    . Diabetes    . Hypertension    . Cancer    . Anxiety disorder    . Depression    . Coronary artery disease       History   Social History  . Marital Status: Married    Spouse Name: N/A    Number of Children: N/A  . Years of Education: N/A   Occupational History  . Not on file.   Social History Main Topics  . Smoking status: Never Smoker   . Smokeless tobacco: Never Used  . Alcohol Use: No  . Drug Use: No  . Sexually Active: Not on file   Other Topics Concern  .  Not on file   Social History Narrative  . No narrative on file     Allergies  Allergen Reactions  . Codeine     REACTION: nausea     Outpatient Prescriptions Prior to Visit  Medication Sig Dispense Refill  . albuterol (PROAIR HFA) 108 (90 BASE) MCG/ACT inhaler Inhale 2 puffs into the lungs every 4 (four) hours as needed.        . ALPRAZolam (XANAX) 0.25 MG tablet Take 0.25 mg by mouth at bedtime as needed.        Marland Kitchen atorvastatin (LIPITOR) 20 MG tablet Take 20 mg by mouth daily.       . calcium carbonate (OS-CAL) 600 MG TABS Take 600 mg by mouth 2 (two) times daily.       . citalopram (CELEXA) 20 MG tablet 3 tabs once daily       . donepezil (ARICEPT) 10 MG tablet Take 10 mg by mouth daily.        . ferrous sulfate 325 (65 FE) MG tablet Take 325 mg by mouth 2 (two) times daily.        . furosemide (  LASIX) 40 MG tablet Take 1 tablet (40 mg total) by mouth 2 (two) times daily.  60 tablet  6  . levothyroxine (SYNTHROID, LEVOTHROID) 100 MCG tablet Take 100 mcg by mouth daily.        Marland Kitchen lisinopril (PRINIVIL,ZESTRIL) 40 MG tablet Take 1 tablet (40 mg total) by mouth daily.  30 tablet  11  . tiotropium (SPIRIVA) 18 MCG inhalation capsule Place 1 capsule (18 mcg total) into inhaler and inhale daily.  30 capsule  6  . verapamil (CALAN-SR) 180 MG CR tablet Take 180 mg by mouth 2 (two) times daily.       Marland Kitchen VITAMIN D, CHOLECALCIFEROL, PO Take 1 capsule by mouth daily. 2000 units once daily      . zolpidem (AMBIEN) 10 MG tablet Take 10 mg by mouth at bedtime as needed.        . warfarin (COUMADIN) 5 MG tablet Take 1 tablet (5 mg total) by mouth daily.  30 tablet  3  . potassium chloride (K-DUR) 10 MEQ tablet Take 1 tablet (10 mEq total) by mouth 2 (two) times daily.  60 tablet  6  . verapamil (CALAN) 120 MG tablet Take 120 mg by mouth daily.           Review of Systems  Respiratory: Positive for cough, shortness of breath and wheezing.   Cardiovascular: Negative for chest pain.    Constitutional:   No  weight loss, night sweats,  Fevers, chills, fatigue, lassitude. HEENT:   No headaches,  Difficulty swallowing,  Tooth/dental problems,  Sore throat,                No sneezing, itching, ear ache, nasal congestion, post nasal drip,   CV:  No chest pain,  Orthopnea, PND, swelling in lower extremities, anasarca, dizziness, palpitations  GI  No heartburn, indigestion, abdominal pain, nausea, vomiting, diarrhea, change in bowel habits, loss of appetite  Resp: Notes  shortness of breath with exertion not at rest.  No excess mucus, no productive cough,  No non-productive cough,  No coughing up of blood.  No change in color of mucus.  No wheezing.  No chest wall deformity  Skin: no rash or lesions.  GU: no dysuria, change in color of urine, no urgency or frequency.  No flank pain.  MS:  No joint pain or swelling.  No decreased range of motion.  No back pain.  Psych:  No change in mood or affect. No depression or anxiety.  No memory loss.     Objective:   Physical Exam  Filed Vitals:   09/23/11 0928 09/23/11 0930  BP: 116/68   Pulse: 57   Temp: 97.9 F (36.6 C)   TempSrc: Oral   Height: 5\' 4"  (1.626 m)   Weight: 201 lb 3.2 oz (91.264 kg)   SpO2: 87% 94%    Gen: Pleasant,obese ,elderly WF , in no distress,  normal affect  ENT: No lesions,  mouth clear,  oropharynx clear, no postnasal drip  Neck: No JVD, no TMG, no carotid bruits  Lungs: No use of accessory muscles, no dullness to percussion, distant BS  Cardiovascular: RRR, heart sounds normal, no murmur or gallops, no peripheral edema  Abdomen: soft and NT, no HSM,  BS normal  Musculoskeletal: No deformities, no cyanosis or clubbing  Neuro: alert, non focal  Skin: Warm, no lesions or rashes       CXR: no active disease  Assessment & Plan:   C O P  D Golds stage IV chronic obstructive lung disease oxygen dependent Patient appears to be stable to this time from a pulmonary  standpoint. Plan Continued inhaled medications as prescribed Continue oxygen therapy Note patient is already received flu vaccine     Updated Medication List Outpatient Encounter Prescriptions as of 09/23/2011  Medication Sig Dispense Refill  . albuterol (PROAIR HFA) 108 (90 BASE) MCG/ACT inhaler Inhale 2 puffs into the lungs every 4 (four) hours as needed.        . ALPRAZolam (XANAX) 0.25 MG tablet Take 0.25 mg by mouth at bedtime as needed.        Marland Kitchen atorvastatin (LIPITOR) 20 MG tablet Take 20 mg by mouth daily.       . calcium carbonate (OS-CAL) 600 MG TABS Take 600 mg by mouth 2 (two) times daily.       . cephALEXin (KEFLEX) 500 MG capsule Take 1 tablet by mouth Twice daily.      . citalopram (CELEXA) 20 MG tablet 3 tabs once daily       . digoxin (LANOXIN) 0.125 MG tablet Take 1 tablet by mouth Daily.      Marland Kitchen donepezil (ARICEPT) 10 MG tablet Take 10 mg by mouth daily.        . ferrous sulfate 325 (65 FE) MG tablet Take 325 mg by mouth 2 (two) times daily.        . furosemide (LASIX) 40 MG tablet Take 1 tablet (40 mg total) by mouth 2 (two) times daily.  60 tablet  6  . KLOR-CON M20 20 MEQ tablet Take 1 tablet by mouth Twice daily.      Marland Kitchen levothyroxine (SYNTHROID, LEVOTHROID) 100 MCG tablet Take 100 mcg by mouth daily.        Marland Kitchen lisinopril (PRINIVIL,ZESTRIL) 40 MG tablet Take 1 tablet (40 mg total) by mouth daily.  30 tablet  11  . tiotropium (SPIRIVA) 18 MCG inhalation capsule Place 1 capsule (18 mcg total) into inhaler and inhale daily.  30 capsule  6  . verapamil (CALAN-SR) 180 MG CR tablet Take 180 mg by mouth 2 (two) times daily.       Marland Kitchen VITAMIN D, CHOLECALCIFEROL, PO Take 1 capsule by mouth daily. 2000 units once daily      . warfarin (COUMADIN) 5 MG tablet Take 5 mg by mouth daily. As directed       . zolpidem (AMBIEN) 10 MG tablet Take 10 mg by mouth at bedtime as needed.        Marland Kitchen DISCONTD: warfarin (COUMADIN) 5 MG tablet Take 1 tablet (5 mg total) by mouth daily.  30 tablet  3   . DISCONTD: potassium chloride (K-DUR) 10 MEQ tablet Take 1 tablet (10 mEq total) by mouth 2 (two) times daily.  60 tablet  6  . DISCONTD: verapamil (CALAN) 120 MG tablet Take 120 mg by mouth daily.

## 2011-09-23 NOTE — Assessment & Plan Note (Signed)
Golds stage IV chronic obstructive lung disease oxygen dependent Patient appears to be stable to this time from a pulmonary standpoint. Plan Continued inhaled medications as prescribed Continue oxygen therapy Note patient is already received flu vaccine

## 2011-09-30 ENCOUNTER — Encounter: Payer: Self-pay | Admitting: Physician Assistant

## 2011-09-30 ENCOUNTER — Ambulatory Visit: Payer: Medicare Other | Admitting: Physician Assistant

## 2011-09-30 ENCOUNTER — Ambulatory Visit (INDEPENDENT_AMBULATORY_CARE_PROVIDER_SITE_OTHER): Payer: Medicare Other | Admitting: Physician Assistant

## 2011-09-30 VITALS — BP 114/58 | HR 58 | Ht 64.0 in | Wt 194.0 lb

## 2011-09-30 DIAGNOSIS — I4892 Unspecified atrial flutter: Secondary | ICD-10-CM

## 2011-09-30 DIAGNOSIS — I4891 Unspecified atrial fibrillation: Secondary | ICD-10-CM

## 2011-09-30 DIAGNOSIS — R9389 Abnormal findings on diagnostic imaging of other specified body structures: Secondary | ICD-10-CM | POA: Insufficient documentation

## 2011-09-30 DIAGNOSIS — I10 Essential (primary) hypertension: Secondary | ICD-10-CM

## 2011-09-30 DIAGNOSIS — J449 Chronic obstructive pulmonary disease, unspecified: Secondary | ICD-10-CM

## 2011-09-30 LAB — BASIC METABOLIC PANEL
BUN: 21 mg/dL (ref 6–23)
CO2: 42 mEq/L — ABNORMAL HIGH (ref 19–32)
Chloride: 93 mEq/L — ABNORMAL LOW (ref 96–112)
Creatinine, Ser: 1.1 mg/dL (ref 0.4–1.2)
Glucose, Bld: 98 mg/dL (ref 70–99)

## 2011-09-30 NOTE — Patient Instructions (Addendum)
Your physician recommends that you schedule a follow-up appointment in: 3 MONTHS WITH DR. Graciela Husbands AS PER SCOTT WEAVER, PA-C  Your physician recommends that you return for lab work in: TODAY BMET, DIG LEVEL 427.31 A-FIB  NO CHANGES @ THIS TIME  WHEN YOUR LAB RESULTS COME IN I WILL CALL YOUR DAUGHTER MUFFETT GARBER [phone] AS PER YOUR INSTRUCTIONS TODAY

## 2011-09-30 NOTE — Assessment & Plan Note (Signed)
Rate much better controlled.  She is largely asymptomatic with her heart rate in the 50s.  I will have her get a BMET and Digoxin level.  If digoxin level ok, I will cut her Verapamil down to 240 mg QD.  If digoxin level too high, decrease to 0.0625 mg QD and repeat level.  Follow up in 3 months.  Continue coumadin follow up with PCP.

## 2011-09-30 NOTE — Assessment & Plan Note (Signed)
Per pulmonology.

## 2011-09-30 NOTE — Assessment & Plan Note (Signed)
Management per pulmonology.  

## 2011-09-30 NOTE — Progress Notes (Signed)
History of Present Illness: Primary Electrophysiologist:  Dr. Sherryl Manges  [name] is a 77 y.o. female who presents for follow up on atrial flutter.  She saw Dr. Graciela Husbands in 7/12.  It was felt that her atrial flutter was largely asymptomatic.  She was placed on Coumadin.  Rate control was employed with increasing her calcium channel blocker.  It was felt that she was not a candidate for ablation due to her COPD.  Echo 7/30:  EF 50% to 55%, wall motion was normal, there were no regional wall motion abnormalities, mild AS, MAC, mild LAE, mild RAE, PASP 35, mild pulmonary HTN.  After follow up with Dr. Graciela Husbands, given that her atrial flutter was asymptomatic, it was felt she could follow up PRN.    I saw her 9/14 due to concerns about her heart rate at the PCPs office.  It was 103 here and I changed her Verapamil to 180 mg BID.  She was later seen by Dr. Delford Field and treated for a COPD exacerbation.  She was then noted to have a high heart rate again at her PCPs office and was sent to the ED 9/25.  The ED notes indicate she was asymptomatic with her high heart rates.  She was given IV digoxin and was started on digoxin 0.125 mg QD.  Labs: K 3.1, Creatinine 1.14, Hgb 12.5, INR 2.44.  She had an abnormal CXR and a chest CT demonstrated a RUL opacity vs increased scarring and follow up was recommended in 3-6 months.    Doing well.  Sleeps a lot.  No chest pain.  No increased dyspnea.  Cough is minimal.  No syncope or near syncope. No orthopnea or PND.  Edema is stable.  Taking all meds ok.   Has seen Dr. Delford Field in follow up.    Past Medical History  Diagnosis Date  . COPD (chronic obstructive pulmonary disease)   . Hyperlipidemia   . Hypertension   . Breast cancer   . Osteoporosis   . Hypothyroid   . Anxiety and depression   . Memory loss   . Breast cancer   . Anemia     Presumed GI on iron replacement hemoglobin in the 10 range  . Atrial flutter     Coumadin initiated 7/12 and followed by PCP;  patient felt to be high risk for RFCA secondary to COPD;  echo was done 7/30:  EF 50% to 55%. Wall motion was normal ;there were no regional wall motion abnormalities, mild AS, MAC, mild LAE, mild RAE, PASP 35, mild pulmonary HTN    Current Outpatient Prescriptions  Medication Sig Dispense Refill  . albuterol (PROAIR HFA) 108 (90 BASE) MCG/ACT inhaler Inhale 2 puffs into the lungs every 4 (four) hours as needed.        . ALPRAZolam (XANAX) 0.25 MG tablet Take 0.25 mg by mouth at bedtime as needed.        Marland Kitchen atorvastatin (LIPITOR) 20 MG tablet Take 20 mg by mouth daily.       . citalopram (CELEXA) 20 MG tablet 3 tabs once daily       . digoxin (LANOXIN) 0.125 MG tablet Take 1 tablet by mouth Daily.      Marland Kitchen donepezil (ARICEPT) 10 MG tablet Take 10 mg by mouth daily.        . furosemide (LASIX) 40 MG tablet Take 1 tablet (40 mg total) by mouth 2 (two) times daily.  60 tablet  6  . ibuprofen (  ADVIL,MOTRIN) 200 MG tablet Take 200 mg by mouth as needed.        Marland Kitchen KLOR-CON M20 20 MEQ tablet Take 1 tablet by mouth Twice daily.      Marland Kitchen levothyroxine (SYNTHROID, LEVOTHROID) 100 MCG tablet Take 100 mcg by mouth daily.        Marland Kitchen lisinopril (PRINIVIL,ZESTRIL) 40 MG tablet Take 1 tablet (40 mg total) by mouth daily.  30 tablet  11  . tiotropium (SPIRIVA) 18 MCG inhalation capsule Place 1 capsule (18 mcg total) into inhaler and inhale daily.  30 capsule  6  . verapamil (CALAN-SR) 180 MG CR tablet Take 180 mg by mouth 2 (two) times daily.       Marland Kitchen warfarin (COUMADIN) 5 MG tablet Take 5 mg by mouth daily. As directed       . zolpidem (AMBIEN) 10 MG tablet Take 10 mg by mouth at bedtime as needed.        Marland Kitchen DISCONTD: verapamil (CALAN-SR) 180 MG CR tablet Take 1 tablet (180 mg total) by mouth 2 (two) times daily.  30 tablet  11    Allergies: Allergies  Allergen Reactions  . Codeine     REACTION: nausea    Vital Signs: BP 114/58  Pulse 58  Ht 5\' 4"  (1.626 m)  Wt 194 lb (87.998 kg)  BMI 33.30  kg/m2  PHYSICAL EXAM: Chronically ill-appearing female in no acute distress HEENT: normal Neck: no JVD At 90 degrees Cardiac:  distant S1, S2; Irregularly irregular; no murmur Lungs:  clear to auscultation bilaterally, no wheezing, rhonchi or rales Abd: soft, nontender Ext: Trace-1+ bilateral edema Skin: warm and dry Neuro:  CNs 2-12 intact, no focal abnormalities noted  EKG:  Atrial fibrillation, HR 58, normal axis, NSSTTW changes  ASSESSMENT AND PLAN:

## 2011-09-30 NOTE — Assessment & Plan Note (Signed)
Controlled.  

## 2011-10-01 ENCOUNTER — Telehealth: Payer: Self-pay | Admitting: *Deleted

## 2011-10-01 NOTE — Telephone Encounter (Signed)
Called over to Dr. Alda Berthold office today as well as faxed over lab order for dig level to be done 10/09/11 and to have results faxed to SW. Pt's daughter Muffett aware of appt and the dose changes on the digoxin hold x 3 days then start back on 1/2 tablet of the 0.125 mg only on M, W, Fri's. Danielle Rankin

## 2011-10-01 NOTE — Telephone Encounter (Signed)
lmom for daughter Muffett for cb to discuss results and to have pt hold digoxin x 3 days and start back after 3 days @ 1/2 tab only on  M, W, F's repeat dig level x 1week . Danielle Rankin

## 2011-10-01 NOTE — Telephone Encounter (Signed)
Pt daughter in meeting from 10:30-12:00.

## 2011-10-10 ENCOUNTER — Encounter: Payer: Self-pay | Admitting: Physician Assistant

## 2011-10-13 ENCOUNTER — Telehealth: Payer: Self-pay | Admitting: Internal Medicine

## 2011-10-13 NOTE — Telephone Encounter (Signed)
Pt returning your call

## 2011-10-13 NOTE — Telephone Encounter (Signed)
Pt returning your call again

## 2011-10-13 NOTE — Telephone Encounter (Signed)
Pt rtn call to carol from Friday, mom has dementia, he is her POA

## 2011-10-13 NOTE — Telephone Encounter (Signed)
LMTCB Debbie Courtney Fenlon RN  

## 2011-10-13 NOTE — Telephone Encounter (Signed)
Line busy x2. Mylo Red RN

## 2011-10-14 NOTE — Telephone Encounter (Signed)
lmom again to go over lab results and to see when pt had an appt to see dr. Collins Scotland so that we could have them get and ekg and fax results to scott weaver, pa-c. Danielle Rankin

## 2011-10-16 ENCOUNTER — Telehealth: Payer: Self-pay | Admitting: Internal Medicine

## 2011-10-16 NOTE — Telephone Encounter (Deleted)
Son stated that Okey Regal had left 2 messages for them to return the phone call.  Please call [phone].

## 2011-10-16 NOTE — Telephone Encounter (Signed)
lmom for son we were checking in to see how pt has been feeling and that she has an appt 10/22/11 w/[facility] and that we have asked for them to get an ekg and fax results to [place] that day. Danielle Rankin

## 2011-10-16 NOTE — Telephone Encounter (Signed)
Son has returned call.  Please call him at [phone].

## 2011-11-04 ENCOUNTER — Other Ambulatory Visit: Payer: Self-pay

## 2011-11-04 ENCOUNTER — Emergency Department (HOSPITAL_COMMUNITY): Payer: Medicare Other

## 2011-11-04 ENCOUNTER — Encounter (HOSPITAL_COMMUNITY): Payer: Self-pay | Admitting: *Deleted

## 2011-11-04 ENCOUNTER — Inpatient Hospital Stay (HOSPITAL_COMMUNITY)
Admission: EM | Admit: 2011-11-04 | Discharge: 2011-11-10 | DRG: 193 | Disposition: A | Discharge status: Skilled Nursing Facility | Payer: Medicare Other | Attending: Pulmonary Disease | Admitting: Pulmonary Disease

## 2011-11-04 DIAGNOSIS — N179 Acute kidney failure, unspecified: Secondary | ICD-10-CM | POA: Diagnosis present

## 2011-11-04 DIAGNOSIS — I4892 Unspecified atrial flutter: Secondary | ICD-10-CM

## 2011-11-04 DIAGNOSIS — J189 Pneumonia, unspecified organism: Principal | ICD-10-CM | POA: Diagnosis present

## 2011-11-04 DIAGNOSIS — Z853 Personal history of malignant neoplasm of breast: Secondary | ICD-10-CM

## 2011-11-04 DIAGNOSIS — R001 Bradycardia, unspecified: Secondary | ICD-10-CM | POA: Diagnosis present

## 2011-11-04 DIAGNOSIS — C50919 Malignant neoplasm of unspecified site of unspecified female breast: Secondary | ICD-10-CM

## 2011-11-04 DIAGNOSIS — Z66 Do not resuscitate: Secondary | ICD-10-CM | POA: Diagnosis present

## 2011-11-04 DIAGNOSIS — E785 Hyperlipidemia, unspecified: Secondary | ICD-10-CM | POA: Diagnosis present

## 2011-11-04 DIAGNOSIS — J9600 Acute respiratory failure, unspecified whether with hypoxia or hypercapnia: Secondary | ICD-10-CM | POA: Diagnosis present

## 2011-11-04 DIAGNOSIS — I4891 Unspecified atrial fibrillation: Secondary | ICD-10-CM | POA: Diagnosis present

## 2011-11-04 DIAGNOSIS — E875 Hyperkalemia: Secondary | ICD-10-CM

## 2011-11-04 DIAGNOSIS — J449 Chronic obstructive pulmonary disease, unspecified: Secondary | ICD-10-CM

## 2011-11-04 DIAGNOSIS — R0902 Hypoxemia: Secondary | ICD-10-CM

## 2011-11-04 DIAGNOSIS — E860 Dehydration: Secondary | ICD-10-CM

## 2011-11-04 DIAGNOSIS — I10 Essential (primary) hypertension: Secondary | ICD-10-CM | POA: Diagnosis present

## 2011-11-04 DIAGNOSIS — R609 Edema, unspecified: Secondary | ICD-10-CM

## 2011-11-04 DIAGNOSIS — J4489 Other specified chronic obstructive pulmonary disease: Secondary | ICD-10-CM | POA: Diagnosis present

## 2011-11-04 DIAGNOSIS — I498 Other specified cardiac arrhythmias: Secondary | ICD-10-CM | POA: Diagnosis present

## 2011-11-04 DIAGNOSIS — I509 Heart failure, unspecified: Secondary | ICD-10-CM | POA: Diagnosis present

## 2011-11-04 DIAGNOSIS — R9389 Abnormal findings on diagnostic imaging of other specified body structures: Secondary | ICD-10-CM

## 2011-11-04 HISTORY — DX: Dependence on supplemental oxygen: Z99.81

## 2011-11-04 HISTORY — DX: Heart failure, unspecified: I50.9

## 2011-11-04 HISTORY — DX: Chronic kidney disease, unspecified: N18.9

## 2011-11-04 HISTORY — DX: Major depressive disorder, single episode, unspecified: F32.9

## 2011-11-04 HISTORY — DX: Anxiety disorder, unspecified: F41.9

## 2011-11-04 HISTORY — DX: Depression, unspecified: F32.A

## 2011-11-04 LAB — URINALYSIS, ROUTINE W REFLEX MICROSCOPIC
Ketones, ur: 15 mg/dL — AB
Nitrite: NEGATIVE
Protein, ur: >300 mg/dL — AB
Urobilinogen, UA: 1 mg/dL (ref 0.0–1.0)
pH: 5 (ref 5.0–8.0)

## 2011-11-04 LAB — DIFFERENTIAL
Eosinophils Relative: 0 % (ref 0–5)
Lymphocytes Relative: 5 % — ABNORMAL LOW (ref 12–46)
Lymphs Abs: 0.8 10*3/uL (ref 0.7–4.0)
Monocytes Absolute: 1.9 10*3/uL — ABNORMAL HIGH (ref 0.1–1.0)
Monocytes Relative: 12 % (ref 3–12)
Neutro Abs: 13.3 10*3/uL — ABNORMAL HIGH (ref 1.7–7.7)

## 2011-11-04 LAB — CBC
HCT: 32.7 % — ABNORMAL LOW (ref 36.0–46.0)
Hemoglobin: 10.3 g/dL — ABNORMAL LOW (ref 12.0–15.0)
MCV: 98.8 fL (ref 78.0–100.0)
RDW: 16.2 % — ABNORMAL HIGH (ref 11.5–15.5)
WBC: 16 10*3/uL — ABNORMAL HIGH (ref 4.0–10.5)

## 2011-11-04 LAB — URINE MICROSCOPIC-ADD ON

## 2011-11-04 LAB — PRO B NATRIURETIC PEPTIDE: Pro B Natriuretic peptide (BNP): 9775 pg/mL — ABNORMAL HIGH (ref 0–450)

## 2011-11-04 LAB — BASIC METABOLIC PANEL
BUN: 44 mg/dL — ABNORMAL HIGH (ref 6–23)
CO2: 24 mEq/L (ref 19–32)
Calcium: 9.7 mg/dL (ref 8.4–10.5)
Chloride: 93 mEq/L — ABNORMAL LOW (ref 96–112)
Creatinine, Ser: 3.64 mg/dL — ABNORMAL HIGH (ref 0.50–1.10)

## 2011-11-04 LAB — MRSA PCR SCREENING: MRSA by PCR: NEGATIVE

## 2011-11-04 LAB — POCT I-STAT, CHEM 8
BUN: 50 mg/dL — ABNORMAL HIGH (ref 6–23)
Calcium, Ion: 1.1 mmol/L — ABNORMAL LOW (ref 1.12–1.32)
Chloride: 100 mEq/L (ref 96–112)
Creatinine, Ser: 3.7 mg/dL — ABNORMAL HIGH (ref 0.50–1.10)

## 2011-11-04 LAB — CARDIAC PANEL(CRET KIN+CKTOT+MB+TROPI)
Relative Index: 3.4 — ABNORMAL HIGH (ref 0.0–2.5)
Total CK: 118 U/L (ref 7–177)

## 2011-11-04 LAB — GLUCOSE, CAPILLARY: Glucose-Capillary: 143 mg/dL — ABNORMAL HIGH (ref 70–99)

## 2011-11-04 MED ORDER — INSULIN ASPART 100 UNIT/ML ~~LOC~~ SOLN
5.0000 [IU] | Freq: Once | SUBCUTANEOUS | Status: AC
  Administered 2011-11-04: 5 [IU] via INTRAVENOUS
  Filled 2011-11-04: qty 1

## 2011-11-04 MED ORDER — SODIUM CHLORIDE 0.9 % IV SOLN: 250.0000 mL | INTRAVENOUS | Status: DC | PRN

## 2011-11-04 MED ORDER — IPRATROPIUM BROMIDE 0.02 % IN SOLN
0.5000 mg | Freq: Once | RESPIRATORY_TRACT | Status: AC
  Administered 2011-11-04: 0.5 mg via RESPIRATORY_TRACT
  Filled 2011-11-04: qty 2.5

## 2011-11-04 MED ORDER — IPRATROPIUM BROMIDE 0.02 % IN SOLN
0.5000 mg | RESPIRATORY_TRACT | Status: DC
  Administered 2011-11-04 – 2011-11-05 (×4): 0.5 mg via RESPIRATORY_TRACT
  Filled 2011-11-04 (×4): qty 2.5

## 2011-11-04 MED ORDER — SODIUM CHLORIDE 0.9 % IV SOLN
INTRAVENOUS | Status: DC
  Administered 2011-11-04: 14:00:00 via INTRAVENOUS

## 2011-11-04 MED ORDER — VANCOMYCIN HCL IN DEXTROSE 1-5 GM/200ML-% IV SOLN
1000.0000 mg | INTRAVENOUS | Status: DC
  Administered 2011-11-04: 1000 mg via INTRAVENOUS
  Filled 2011-11-04 (×2): qty 200

## 2011-11-04 MED ORDER — SODIUM BICARBONATE 8.4 % IV SOLN
INTRAVENOUS | Status: DC
  Administered 2011-11-05: 12:00:00 via INTRAVENOUS
  Filled 2011-11-04 (×5): qty 150

## 2011-11-04 MED ORDER — VANCOMYCIN HCL IN DEXTROSE 1-5 GM/200ML-% IV SOLN
1000.0000 mg | Freq: Once | INTRAVENOUS | Status: DC
  Filled 2011-11-04: qty 200

## 2011-11-04 MED ORDER — SODIUM POLYSTYRENE SULFONATE 15 GM/60ML PO SUSP
30.0000 g | Freq: Once | ORAL | Status: AC
  Administered 2011-11-04: 30 g via ORAL
  Filled 2011-11-04: qty 120

## 2011-11-04 MED ORDER — SODIUM CHLORIDE 0.9 % IV SOLN
1.0000 g | Freq: Once | INTRAVENOUS | Status: AC
  Administered 2011-11-04: 1 g via INTRAVENOUS
  Filled 2011-11-04: qty 10

## 2011-11-04 MED ORDER — DEXTROSE 5 % IV SOLN: INTRAVENOUS | Status: DC

## 2011-11-04 MED ORDER — ALBUTEROL SULFATE (5 MG/ML) 0.5% IN NEBU
2.5000 mg | INHALATION_SOLUTION | Freq: Four times a day (QID) | RESPIRATORY_TRACT | Status: DC
  Administered 2011-11-04 – 2011-11-05 (×3): 2.5 mg via RESPIRATORY_TRACT
  Filled 2011-11-04 (×3): qty 0.5

## 2011-11-04 MED ORDER — LEVOTHYROXINE SODIUM 100 MCG PO TABS
100.0000 ug | ORAL_TABLET | Freq: Every day | ORAL | Status: DC
  Administered 2011-11-05 – 2011-11-10 (×6): 100 ug via ORAL
  Filled 2011-11-04 (×6): qty 1

## 2011-11-04 MED ORDER — DEXTROSE 5 % IV SOLN
1.0000 g | Freq: Once | INTRAVENOUS | Status: AC
  Administered 2011-11-04: 1 g via INTRAVENOUS
  Filled 2011-11-04: qty 1

## 2011-11-04 MED ORDER — DEXTROSE 50 % IV SOLN
25.0000 g | Freq: Once | INTRAVENOUS | Status: AC
  Administered 2011-11-04: 25 g via INTRAVENOUS
  Filled 2011-11-04: qty 50

## 2011-11-04 MED ORDER — ALBUTEROL SULFATE (5 MG/ML) 0.5% IN NEBU: 2.5000 mg | INHALATION_SOLUTION | RESPIRATORY_TRACT | Status: DC | PRN

## 2011-11-04 MED ORDER — DEXTROSE 5 % IV SOLN
1.0000 g | INTRAVENOUS | Status: DC
  Administered 2011-11-05 – 2011-11-09 (×5): 1 g via INTRAVENOUS
  Filled 2011-11-04 (×6): qty 1

## 2011-11-04 MED ORDER — ALBUTEROL SULFATE (5 MG/ML) 0.5% IN NEBU
5.0000 mg | INHALATION_SOLUTION | Freq: Once | RESPIRATORY_TRACT | Status: AC
  Administered 2011-11-04: 5 mg via RESPIRATORY_TRACT
  Filled 2011-11-04: qty 1

## 2011-11-04 MED ORDER — PANTOPRAZOLE SODIUM 40 MG IV SOLR
40.0000 mg | Freq: Every day | INTRAVENOUS | Status: DC
  Administered 2011-11-04: 40 mg via INTRAVENOUS
  Filled 2011-11-04 (×2): qty 40

## 2011-11-04 NOTE — ED Notes (Signed)
Patient returned from CT

## 2011-11-04 NOTE — ED Notes (Signed)
Called Respiratory for by-pap

## 2011-11-04 NOTE — ED Notes (Addendum)
Per EMS patient was found on the floor by her care giver. EMS was called to assist with getting the patient up off the floor, at EMS arrival noted patient was having episodes of deep breathing/belly breathing. Patient lives alone, family states family is acting at her norm but were unable to give information about her care. Patient appeared out of it on scene with O2 sat in the low 80's. Pt is on home O2 at 2L. Pt was placed on NRB en route.

## 2011-11-04 NOTE — ED Notes (Signed)
Pt placed back on bi-pap per admitting. Lab at bedside for blood cultures. Pt remains on monitor. Dr Tyson Alias speaking with family regarding poc.

## 2011-11-04 NOTE — ED Notes (Signed)
Patient is resting with NAD at this time. Patient is belly breathing and states she does not feel SOB and denies pain.

## 2011-11-04 NOTE — ED Notes (Signed)
Patient repositioned and is tolerating bi-pap.

## 2011-11-04 NOTE — ED Notes (Signed)
Critical care at bedside. Pt switched over to 2L pending CT due to pt having hx of COPD.

## 2011-11-04 NOTE — ED Notes (Signed)
Patient edema +2 bilateral legs. Skin is cool and dry to touch. Patient denies any pain and no n/v. Patient states she felt fine this morning when she got up and does not know why she fell in the floor. Patient denies having breathing problems. Patient is showing signs of confusion and belly breathing at this time. Patient lives alone and family lives beside her. Family states patient is confused at times and has a sitter that comes in to check on her through out the day. Sitter found the patient in the floor this morning and does not know how long she has been in the floor. Family states patient did not have her oxygen on when she was found in the floor. PERRLA and stroke stale negative.

## 2011-11-04 NOTE — ED Notes (Signed)
Pt and family aware of poc

## 2011-11-04 NOTE — H&P (Signed)
77 yr old WF h/o COPD, Fib found down, MODS, resp failure, bradycardic. Last seen yesterday evening 630 pm. Found down this am  On floor. No trauma apparat Huston Foley in ER. No fever, sput no n/v/d. no abdo pain reported.  Found with ARF, resp failure, hyperkalemia, and INR 3.4. Called to admit.  No other clear history reported, no slurred speech or facial droop. No bleeding.   77 yr old h/o COPD, Afib. Home O2 dependent.  Allergies  Allergen Reactions  . Codeine     REACTION: nausea      Active Ambulatory Problems    Diagnosis Date Noted  . ADENOCARCINOMA, BREAST, RIGHT 11/17/2007  . HYPERLIPIDEMIA 11/17/2007  . HYPERTENSION 11/30/2007  . C O P D 11/17/2007  . OSTEOPOROSIS 11/30/2007  . Atrial fib/flutter 06/16/2011  . Peripheral edema 06/16/2011  . Abnormal chest CT 09/30/2011   Resolved Ambulatory Problems    Diagnosis Date Noted  . No Resolved Ambulatory Problems   Past Medical History  Diagnosis Date  . COPD (chronic obstructive pulmonary disease)   . Hyperlipidemia   . Hypertension   . Breast cancer   . Osteoporosis   . Hypothyroid   . Anxiety and depression   . Memory loss   . Breast cancer   . Anemia   . Atrial flutter    meds home:  1. Albuterol inhaler 108 mcg 2 puffs q.4 hours p.r.n. - rarely used.   2. Xanax 0.25 mg at bedtime p.r.n.   3. Lipitor 20 mg 3 tablets daily.   4. Calcium plus D 250/125 mg b.i.d.   5. Celexa 20 mg 3 tablets once a day.   6. Aricept 10 mg daily.   7. Ferrous sulfate 325 mg b.i.d.   8. Furosemide 40 mg b.i.d.   9. Synthroid 100 mcg daily.   10.Lisinopril 80 mg daily.   11.Potassium chloride 10 mEq b.i.d.   12.Spiriva 18 mcg inhaled daily.   13.Verapamil 180 mg b.i.d.   14.Vitamin D daily.   15.Coumadin as directed.   16.Ambien 10 mg at bedtime p.r.n.   17.Prednisone taper.      FAMILY HISTORY:  Mother died at 47 with history of MI and CHF.  Father   died in his late 28s of an MI.  She has total of 5 siblings including 4     sisters and 1 brother.      SOCIAL HISTORY:  The patient lives in [place] by herself, although   her son and daughter-in-law live the next door.  She is retired Technical sales engineer.  She denies tobacco, alcohol, or drug use.  Does not   routinely exercise.      REVIEW OF SYSTEMS:  Unable to obtain , limited neurostatus  Filed Vitals:   11/04/11 1545  BP: 125/54  Pulse: 39  Resp: 24    General:awake, mod distress Neuro: awake, lethargic HEENT: jvd min PULM: coarse bilat CV: s1 s2 RRB distant GI: BS wnl no r, g Extremities: edema 3 plus  Lab>  CBC    Component Value Date/Time   WBC 16.0* 11/04/2011 1340   RBC 3.31* 11/04/2011 1340   HGB 11.2* 11/04/2011 1402   HCT 33.0* 11/04/2011 1402   PLT 216 11/04/2011 1340   MCV 98.8 11/04/2011 1340   MCH 31.1 11/04/2011 1340   MCHC 31.5 11/04/2011 1340   RDW 16.2* 11/04/2011 1340   LYMPHSABS 0.8 11/04/2011 1340   MONOABS 1.9* 11/04/2011 1340   EOSABS 0.0 11/04/2011 1340  BASOSABS 0.0 11/04/2011 1340    BMET    Component Value Date/Time   NA 135 11/04/2011 1402   K 6.0* 11/04/2011 1402   CL 100 11/04/2011 1402   CO2 24 11/04/2011 1340   GLUCOSE 130* 11/04/2011 1402   BUN 50* 11/04/2011 1402   CREATININE 3.70* 11/04/2011 1402   CALCIUM 9.7 11/04/2011 1340   GFRNONAA 10* 11/04/2011 1340   GFRAA 12* 11/04/2011 1340   Radiology Last baseline  ABG    Component Value Date/Time   PHART 7.414* 10/24/2007 1542   PCO2ART 47.6* 10/24/2007 1542   PO2ART 52.0* 10/24/2007 1542   HCO3 30.4* 10/24/2007 1542   TCO2 27 11/04/2011 1402   O2SAT 87.0 10/24/2007 1542    Patient Active Problem List  Diagnoses  . ADENOCARCINOMA, BREAST, RIGHT  . HYPERLIPIDEMIA  . HYPERTENSION  . C O P D  . OSTEOPOROSIS  . Atrial fib/flutter  . Peripheral edema  . Abnormal chest CT    A/P 1. Change in MS 2. Acute Resp failure 3. hyperKalemia 4. Acute Renal Failure, ACEI? Induced, volume status? rhabdo? 5. Bradycardia, r/o  Dig tox, slow fib, priopr verapamil insetting ARF 6. Leukocytosis 7. SuperTher INR 8. R/o aspiration 9. COPD 10/ conformed NCb status per son, treat medical otherwise  Plan:   Neuro- inr elevated, assess CT head , agree  resp- Acute resp failure in settting hyperkalemia, NIMV to correct presumed acidotic PH, mandatory NIMV written pcxr ? Asp vs failure? Reluctant to use lasix now, pcxr follow up  [facility]- home baseline HR 60, now 40 , dig level just back 0.4. likely now brady from K and ARF, electrolytes, add bicarb for K, see renal Last echo reviewed, may need input from cards...first correct her K and re assess, hold dig  Holding BP fine on won for now Once brady corrected, needs lasix Recent  pred tapper?  Add stress steroids for now  Renal- start bicarb drip, may need line for cvp to guide volume status? bmet q8h, foley, renal US Stat calcium chloride, cbg then amp d5  Then insulin Dc ACEI  Gi - LFt, repeat cok for rhabdo , volume for now, see above, npo, ppi  ID- asp? Add stat ceftaz, vanc, sptum, BC  Heme- hold coumadin, coags in am , cbc in am  Endo- cbg, cortisol if drop BP  Extenisve d/w son. Continue medical support, but wish no heroic measures, ncb.  CCM 65 min Feinstein [phone]

## 2011-11-04 NOTE — ED Provider Notes (Signed)
History     CSN: [id number] Arrival date & time: 11/04/2011 12:57 PM   First MD Initiated Contact with Patient 11/04/11 1311      Chief Complaint  Patient presents with  . Respiratory Distress    The history is provided by a relative and the EMS personnel. History Limited By: hx dementia.   Pt was seen at 1315.  Per EMS and [name] family, c/o pt found on the floor by her Caregiver PTA.  Unclear how long pt had been on the floor.  Was found without her usual chronic O2 N/C in place, EMS noted O2 Sat in low 80'[name].  [name] family states she is acting per her norm and is chronically SOB,  but appears more SOB today.  Pt lives alone, has hx dementia.  States to ED staff on arrival that she is "fine."  Denies CP, denies SOB, denies abd pain, denies back pain.     Past Medical History  Diagnosis Date  . COPD (chronic obstructive pulmonary disease)   . Hyperlipidemia   . Hypertension   . Breast cancer   . Osteoporosis   . Hypothyroid   . Anxiety and depression   . Memory loss   . Breast cancer   . Anemia     Presumed GI on iron replacement hemoglobin in the 10 range  . Atrial flutter     Coumadin initiated 7/12 and followed by PCP; patient felt to be high risk for RFCA secondary to COPD;  echo was done 7/30:  EF 50% to 55%. Wall motion was normal ;there were no regional wall motion abnormalities, mild AS, MAC, mild LAE, mild RAE, PASP 35, mild pulmonary HTN  . On home O2     Past Surgical History  Procedure Date  . Mastectomy 1990    right  . Tonsillectomy   . Appendectomy   . Abdominal hysterectomy     Family History  Problem Relation Age of Onset  . Emphysema    . Heart attack    . Diabetes    . Hypertension    . Cancer    . Anxiety disorder    . Depression    . Coronary artery disease      History  Substance Use Topics  . Smoking status: Never Smoker   . Smokeless tobacco: Never Used  . Alcohol Use: No   Review of Systems  Unable to perform ROS: Dementia     Allergies  Codeine  Home Medications   Current Outpatient Rx  Name Route Sig Dispense Refill  . ALBUTEROL SULFATE HFA 108 (90 BASE) MCG/ACT IN AERS Inhalation Inhale 2 puffs into the lungs every 4 (four) hours as needed. For shortness of breath    . ALPRAZOLAM 0.25 MG PO TABS Oral Take 0.25 mg by mouth at bedtime as needed. For anxiety    . ATORVASTATIN CALCIUM 20 MG PO TABS Oral Take 20 mg by mouth daily.     Marland Kitchen CALCIUM PO Oral Take 1 tablet by mouth 2 (two) times daily.      Marland Kitchen VITAMIN D PO Oral Take 1 tablet by mouth daily.      Marland Kitchen CITALOPRAM HYDROBROMIDE 20 MG PO TABS Oral Take 60 mg by mouth daily.     Marland Kitchen DIGOXIN 0.125 MG PO TABS Oral Take 1 tablet by mouth Daily.    . DONEPEZIL HCL 10 MG PO TABS Oral Take 10 mg by mouth at bedtime.     . FUROSEMIDE 40 MG PO  TABS Oral Take 40 mg by mouth daily.      . IBUPROFEN 200 MG PO TABS Oral Take 200 mg by mouth every 8 (eight) hours as needed. For pain    . IRON PO Oral Take 1 tablet by mouth 2 (two) times daily.      Marland Kitchen KLOR-CON M20 20 MEQ PO TBCR Oral Take 20 mEq by mouth Twice daily.     Marland Kitchen LEVOTHYROXINE SODIUM 100 MCG PO TABS Oral Take 100 mcg by mouth daily.      Marland Kitchen LISINOPRIL 40 MG PO TABS Oral Take 1 tablet (40 mg total) by mouth daily. 30 tablet 11  . TIOTROPIUM BROMIDE MONOHYDRATE 18 MCG IN CAPS Inhalation Place 18 mcg into inhaler and inhale daily.      Marland Kitchen VERAPAMIL HCL ER 180 MG PO TBCR Oral Take 180 mg by mouth 2 (two) times daily.     . WARFARIN SODIUM 5 MG PO TABS Oral Take 5-7.5 mg by mouth daily. Take 7.5mg  on "Sunday and 5mg all other days    . ZOLPIDEM TARTRATE 10 MG PO TABS Oral Take 10 mg by mouth at bedtime as needed. For sleep      BP 125/54  Pulse 39  Resp 24  SpO2 100%  Physical Exam 1320: Physical examination:  Nursing notes reviewed; Vital signs and O2 SAT reviewed;  Constitutional: Well developed, Well nourished, In no acute distress; Head:  Normocephalic, atraumatic; Eyes: EOMI, PERRL, No scleral icterus; ENMT:  Mouth and pharynx normal, Mucous membranes dry; Neck: Supple, Full range of motion, No lymphadenopathy; Cardiovascular: Bradycardic, No murmur or gallop; Respiratory: Breath sounds diminished and coarse bilaterally, No wheezes, +access muscle use, +tachypneic;  Chest: Nontender, Movement normal; Abdomen: Soft, Nontender, Nondistended, Normal bowel sounds; Extremities: Pulses normal, No deformity, no tenderness, 2+ pedal edema bilat, No calf asymmetry.; Neuro: Awake/alert, confused re: events, no facial droop, speech clear, Major CN grossly intact. Moves all ext on stretcher spontaneously..; Skin: Color pale, Warm, Dry, no rash.    ED Course  Procedures  1415:  Long d/w [name] family (son is POA with his sister), regarding family and [name] wishes re: DNR/DNI.  Son has spoken with his sister and both are in agreement that their and [name] wishes are to be DNR/DNI.  ED RN with me during this conversation.  DNR/DNI signed and witnessed by ED RN.   1445:  Bipap on.  Sats increased to mid-90'[name].  SBP improving to 100/110'[name].  Remains regular bradycardic rhythm with rate in 40'[name].  Eyes open with family in room.    1515:  Digoxin level pending (have concern for toxicity given elevated BUN/Cr and junctional bradycardia).  H/H near baseline.  BUN/Cr elevated from baseline.  INR therapeutic. BNP elevated, no recent old to compare. Dx testing d/w [name] family.  Questions answered.  Verb understanding, agreeable to admit. T/C to PCCM  Dr. Ken Zubelivitskly, case discussed, including:  HPI, pertinent PM/SHx, VS/PE, dx testing, ED course and treatment.  Agreeable to come to ED to eval for admit.   1545:  PCCM Dr. Feinstein here in ED to consult.  Aware digoxin level and CT-H pending.  Pt was placed on O2 N/C in prep to go to CT-H, with Sats remaining mid-to-high 90'[name], SBP 110/120'[name].     16" 00:  PCCM MD requests to start fortaz and vancomycin.  Digoxin level normal.  Will now need tx for elevated potassium, renal failure, CHF.   PCCM to order meds at this time, he will admit  pt to step down.    MDM  MDM Reviewed: nursing note and vitals Reviewed previous: ECG Interpretation: labs, ECG and x-ray Total time providing critical care: 30-74 minutes. This excludes time spent performing separately reportable procedures and services. Consults: critical care   CRITICAL CARE Performed by: Laray Anger Total critical care time: 45 Critical care time was exclusive of separately billable procedures and treating other patients. Critical care was necessary to treat or prevent imminent or life-threatening deterioration. Critical care was time spent personally by me on the following activities: development of treatment plan with patient and/or surrogate as well as nursing, discussions with consultants, evaluation of patient'[name] response to treatment, examination of patient, obtaining history from patient or surrogate, ordering and performing treatments and interventions, ordering and review of laboratory studies, ordering and review of radiographic studies, pulse oximetry and re-evaluation of patient'[name] condition.    Date: 11/04/2011  Rate: 42  Rhythm: sinus bradycardia  QRS Axis: normal  Intervals: QT prolonged  ST/T Wave abnormalities: nonspecific ST/T changes  Conduction Disutrbances: None  Narrative Interpretation:   Old EKG Reviewed: changes noted; previous EKG dated 09/09/2011 was aflutter.  Results for orders placed during the hospital encounter of 11/04/11  BASIC METABOLIC PANEL      Component Value Range   Sodium 136  135 - 145 (mEq/L)   Potassium 6.1 (*) 3.5 - 5.1 (mEq/L)   Chloride 93 (*) 96 - 112 (mEq/L)   CO2 24  19 - 32 (mEq/L)   Glucose, Bld 130 (*) 70 - 99 (mg/dL)   BUN 44 (*) 6 - 23 (mg/dL)   Creatinine, Ser 1.61 (*) 0.50 - 1.10 (mg/dL)   Calcium 9.7  8.4 - 09.6 (mg/dL)   GFR calc non Af Amer 10 (*) >90 (mL/min)   GFR calc Af Amer 12 (*) >90 (mL/min)  CARDIAC PANEL(CRET KIN+CKTOT+MB+TROPI)       Component Value Range   Total CK 118  7 - 177 (U/L)   CK, MB 4.0  0.3 - 4.0 (ng/mL)   Troponin I <0.30  <0.30 (ng/mL)   Relative Index 3.4 (*) 0.0 - 2.5   CBC      Component Value Range   WBC 16.0 (*) 4.0 - 10.5 (K/uL)   RBC 3.31 (*) 3.87 - 5.11 (MIL/uL)   Hemoglobin 10.3 (*) 12.0 - 15.0 (g/dL)   HCT 04.5 (*) 40.9 - 46.0 (%)   MCV 98.8  78.0 - 100.0 (fL)   MCH 31.1  26.0 - 34.0 (pg)   MCHC 31.5  30.0 - 36.0 (g/dL)   RDW 81.1 (*) 91.4 - 15.5 (%)   Platelets 216  150 - 400 (K/uL)  DIFFERENTIAL      Component Value Range   Neutrophils Relative 83 (*) 43 - 77 (%)   Neutro Abs 13.3 (*) 1.7 - 7.7 (K/uL)   Lymphocytes Relative 5 (*) 12 - 46 (%)   Lymphs Abs 0.8  0.7 - 4.0 (K/uL)   Monocytes Relative 12  3 - 12 (%)   Monocytes Absolute 1.9 (*) 0.1 - 1.0 (K/uL)   Eosinophils Relative 0  0 - 5 (%)   Eosinophils Absolute 0.0  0.0 - 0.7 (K/uL)   Basophils Relative 0  0 - 1 (%)   Basophils Absolute 0.0  0.0 - 0.1 (K/uL)  LACTIC ACID, PLASMA      Component Value Range   Lactic Acid, Venous 8.7 (*) 0.5 - 2.2 (mmol/L)  URINALYSIS, ROUTINE W REFLEX MICROSCOPIC      Component  Value Range   Color, Urine AMBER (*) YELLOW    Appearance TURBID (*) CLEAR    Specific Gravity, Urine 1.024  1.005 - 1.030    pH 5.0  5.0 - 8.0    Glucose, UA 100 (*) NEGATIVE (mg/dL)   Hgb urine dipstick NEGATIVE  NEGATIVE    Bilirubin Urine MODERATE (*) NEGATIVE    Ketones, ur 15 (*) NEGATIVE (mg/dL)   Protein, ur >161 (*) NEGATIVE (mg/dL)   Urobilinogen, UA 1.0  0.0 - 1.0 (mg/dL)   Nitrite NEGATIVE  NEGATIVE    Leukocytes, UA SMALL (*) NEGATIVE   PROTIME-INR      Component Value Range   Prothrombin Time 34.8 (*) 11.6 - 15.2 (seconds)   INR 3.39 (*) 0.00 - 1.49   PRO B NATRIURETIC PEPTIDE      Component Value Range   BNP, POC 9775.0 (*) 0 - 450 (pg/mL)  POCT I-STAT, CHEM 8      Component Value Range   Sodium 135  135 - 145 (mEq/L)   Potassium 6.0 (*) 3.5 - 5.1 (mEq/L)   Chloride 100  96 - 112 (mEq/L)    BUN 50 (*) 6 - 23 (mg/dL)   Creatinine, Ser 0.96 (*) 0.50 - 1.10 (mg/dL)   Glucose, Bld 045 (*) 70 - 99 (mg/dL)   Calcium, Ion 4.09 (*) 1.12 - 1.32 (mmol/L)   TCO2 27  0 - 100 (mmol/L)   Hemoglobin 11.2 (*) 12.0 - 15.0 (g/dL)   HCT 81.1 (*) 91.4 - 46.0 (%)  URINE MICROSCOPIC-ADD ON      Component Value Range   Squamous Epithelial / LPF MANY (*) RARE    WBC, UA 0-2  <3 (WBC/hpf)   RBC / HPF 0-2  <3 (RBC/hpf)   Bacteria, UA FEW (*) RARE    Casts HYALINE CASTS (*) NEGATIVE    Urine-Other AMORPHOUS URATES/PHOSPHATES    DIGOXIN LEVEL      Component Value Range   Digoxin Level 0.4 (*) 0.8 - 2.0 (ng/mL)   Dg Chest Port 1 View  11/04/2011  *RADIOLOGY REPORT*  Clinical Data: Shortness of breath.  PORTABLE CHEST - 1 VIEW  Comparison: 09/09/2011  Findings: Superimposed on stable COPD is probable component of congestive heart failure.  No pleural effusions are identified. The heart size is stable.  IMPRESSION: Probable CHF superimposed on underlying COPD.  Original Report Authenticated By: Reola Calkins, M.D.   Results for [name], [name] (MRN [id number]) as of 11/04/2011 16:11  Ref. Range 09/09/2011 13:10 09/30/2011 12:22 11/04/2011 13:40 11/04/2011 14:02  CO2 Latest Range: 19-32 mEq/L 39 (H) 42 (H) 24   BUN Latest Range: 6-23 mg/dL 16 21 44 (H) 50 (H)  Creat Latest Range: 0.50-1.10 mg/dL 0.86 (H) 1.1 5.78 (H) 4.69 (H)   Results for [facility], [name] (MRN [id number]) as of 11/04/2011 16:11  Ref. Range 09/09/2011 13:10 11/04/2011 13:40 11/04/2011 14:02  HGB Latest Range: 12.0-15.0 g/dL 24.4 01.0 (L) 27.2 (L)  HCT Latest Range: 36.0-46.0 % 38.9 32.7 (L) 33.0 (L)   Ct Head Wo Contrast  11/04/2011  *RADIOLOGY REPORT*  Clinical Data: Fall.  Patient found down on floor.  CT HEAD WITHOUT CONTRAST  Technique:  Contiguous axial images were obtained from the base of the skull through the vertex without contrast.  Comparison: None.  Findings: The brain shows cortical atrophy as well as small vessel disease.   No evidence of visible infarction, hemorrhage, mass effect or hydrocephalus.  No extra-axial fluid collections.  No evidence of  mass lesion.  The skull is unremarkable.  IMPRESSION: Acute findings.  Atrophy and small vessel disease.  Original Report Authenticated By: Reola Calkins, M.D.       [name] Jester Allison Quarry, DO 11/04/11 2139

## 2011-11-04 NOTE — ED Notes (Signed)
Pt transferred to floor, receiving RN with no questions at bedside. Family and pt aware of poc. Vitals remains stable. Will call pharmacy and make them aware of pts transfer for pending meds.

## 2011-11-04 NOTE — ED Notes (Signed)
Pt taken off bi-pap and placed on NRB pending transport to CT

## 2011-11-04 NOTE — ED Notes (Signed)
EDP has spoke with family regarding DNR status. DNR filled out, son at bedside is POA. Foley placed. RT at bedside to placed BiPAP.

## 2011-11-04 NOTE — Progress Notes (Signed)
ANTIBIOTIC CONSULT NOTE - INITIAL  Pharmacy Consult for vancomycin + ceftazidime Indication: rule out pneumonia  Allergies  Allergen Reactions  . Codeine     REACTION: nausea    Patient Measurements: Height: 5\' 5"  (165.1 cm) Weight: 201 lb 8 oz (91.4 kg) IBW/kg (Calculated) : 57   Vital Signs: Temp: 97.7 F (36.5 C) (11/20 1840) Temp src: Oral (11/20 1840) BP: 143/57 mmHg (11/20 1840) Pulse Rate: 46  (11/20 1840) Intake/Output from previous day:   Intake/Output from this shift:    Labs:  Basename 11/04/11 1402 11/04/11 1340  WBC -- 16.0*  HGB 11.2* 10.3*  PLT -- 216  LABCREA -- --  CREATININE 3.70* 3.64*   Estimated Creatinine Clearance: 12 ml/min (by C-G formula based on Cr of 3.7). No results found for this basename: VANCOTROUGH:2,VANCOPEAK:2,VANCORANDOM:2,GENTTROUGH:2,GENTPEAK:2,GENTRANDOM:2,TOBRATROUGH:2,TOBRAPEAK:2,TOBRARND:2,AMIKACINPEAK:2,AMIKACINTROU:2,AMIKACIN:2, in the last 72 hours   Microbiology: Recent Results (from the past 720 hour(s))  MRSA PCR SCREENING     Status: Normal   Collection Time   11/04/11  6:46 PM      Component Value Range Status Comment   MRSA by PCR NEGATIVE  NEGATIVE  Final     Medical History: Past Medical History  Diagnosis Date  . COPD (chronic obstructive pulmonary disease)   . Hyperlipidemia   . Hypertension   . Osteoporosis   . Hypothyroid   . Memory loss   . Anemia     Presumed GI on iron replacement hemoglobin in the 10 range  . Atrial flutter     Coumadin initiated 7/12 and followed by PCP; patient felt to be high risk for RFCA secondary to COPD;  echo was done 7/30:  EF 50% to 55%. Wall motion was normal ;there were no regional wall motion abnormalities, mild AS, MAC, mild LAE, mild RAE, PASP 35, mild pulmonary HTN  . On home O2   . Anxiety   . Depression   . CHF (congestive heart failure)   . Emphysema   . Chronic kidney disease   . Breast cancer   . DEMENTIA 11/04/11    "she has some significant  dementia"    Medications:  Prescriptions prior to admission  Medication Sig Dispense Refill  . albuterol (PROAIR HFA) 108 (90 BASE) MCG/ACT inhaler Inhale 2 puffs into the lungs every 4 (four) hours as needed. For shortness of breath      . ALPRAZolam (XANAX) 0.25 MG tablet Take 0.25 mg by mouth at bedtime as needed. For anxiety      . atorvastatin (LIPITOR) 20 MG tablet Take 20 mg by mouth daily.       Marland Kitchen CALCIUM PO Take 1 tablet by mouth 2 (two) times daily.        . Cholecalciferol (VITAMIN D PO) Take 1 tablet by mouth daily.        . citalopram (CELEXA) 20 MG tablet Take 60 mg by mouth daily.       . digoxin (LANOXIN) 0.125 MG tablet Take 1 tablet by mouth Daily.      Marland Kitchen donepezil (ARICEPT) 10 MG tablet Take 10 mg by mouth at bedtime.       . furosemide (LASIX) 40 MG tablet Take 40 mg by mouth daily.        Marland Kitchen ibuprofen (ADVIL,MOTRIN) 200 MG tablet Take 200 mg by mouth every 8 (eight) hours as needed. For pain      . IRON PO Take 1 tablet by mouth 2 (two) times daily.        Marland Kitchen  KLOR-CON M20 20 MEQ tablet Take 20 mEq by mouth Twice daily.       Marland Kitchen levothyroxine (SYNTHROID, LEVOTHROID) 100 MCG tablet Take 100 mcg by mouth daily.        Marland Kitchen lisinopril (PRINIVIL,ZESTRIL) 40 MG tablet Take 1 tablet (40 mg total) by mouth daily.  30 tablet  11  . tiotropium (SPIRIVA) 18 MCG inhalation capsule Place 18 mcg into inhaler and inhale daily.        . verapamil (CALAN-SR) 180 MG CR tablet Take 180 mg by mouth 2 (two) times daily.       Marland Kitchen warfarin (COUMADIN) 5 MG tablet Take 5-7.5 mg by mouth daily. Take 7.5mg  on Sunday and 5mg  all other days      . zolpidem (AMBIEN) 10 MG tablet Take 10 mg by mouth at bedtime as needed. For sleep       Assessment: Pt admitted after being found down. To start empiric antibiotics for possible aspiration. Noted pt is in acute renal failure with a Scr 3.7. Received 1gm of ceftaz already this evening.   Goal of Therapy:  Vancomycin trough level 15-20 mcg/ml  Plan:    Measure antibiotic drug levels at steady state Follow up culture results Vancomycin 1gm IV Q48H Ceftazidime 1gm IV Q24H  F/u renal function and plan  Tarquin Welcher, Drake Leach 11/04/2011,8:18 PM

## 2011-11-04 NOTE — ED Notes (Signed)
Family at bedside, patient is on monitor and resting with NAD at this time.

## 2011-11-04 NOTE — ED Notes (Signed)
Foley catheter size 14 French inserted using sterile technique.  10mL immediate return of concentrated, cloudy, foul-smelling urine collected for UA and C&S.

## 2011-11-05 ENCOUNTER — Inpatient Hospital Stay (HOSPITAL_COMMUNITY): Payer: Medicare Other

## 2011-11-05 DIAGNOSIS — J189 Pneumonia, unspecified organism: Secondary | ICD-10-CM | POA: Diagnosis present

## 2011-11-05 LAB — BASIC METABOLIC PANEL
BUN: 49 mg/dL — ABNORMAL HIGH (ref 6–23)
BUN: 49 mg/dL — ABNORMAL HIGH (ref 6–23)
CO2: 35 mEq/L — ABNORMAL HIGH (ref 19–32)
Chloride: 94 mEq/L — ABNORMAL LOW (ref 96–112)
Creatinine, Ser: 3.95 mg/dL — ABNORMAL HIGH (ref 0.50–1.10)
GFR calc Af Amer: 11 mL/min — ABNORMAL LOW (ref >90)
GFR calc Af Amer: 11 mL/min — ABNORMAL LOW (ref >90)
GFR calc non Af Amer: 9 mL/min — ABNORMAL LOW (ref >90)
Glucose, Bld: 111 mg/dL — ABNORMAL HIGH (ref 70–99)
Potassium: 5.7 mEq/L — ABNORMAL HIGH (ref 3.5–5.1)
Potassium: 5.1 mEq/L (ref 3.5–5.1)
Sodium: 136 mEq/L (ref 135–145)

## 2011-11-05 LAB — CBC
MCH: 30.5 pg (ref 26.0–34.0)
MCV: 96.3 fL (ref 78.0–100.0)
Platelets: 139 10*3/uL — ABNORMAL LOW (ref 150–400)
RDW: 16.1 % — ABNORMAL HIGH (ref 11.5–15.5)
WBC: 11 10*3/uL — ABNORMAL HIGH (ref 4.0–10.5)

## 2011-11-05 LAB — URINE CULTURE
Colony Count: NO GROWTH
Culture  Setup Time: 201211201508
Culture: NO GROWTH

## 2011-11-05 LAB — MAGNESIUM: Magnesium: 2 mg/dL (ref 1.5–2.5)

## 2011-11-05 MED ORDER — FUROSEMIDE 10 MG/ML IJ SOLN
80.0000 mg | Freq: Once | INTRAMUSCULAR | Status: AC
  Administered 2011-11-05: 80 mg via INTRAVENOUS
  Filled 2011-11-05: qty 8

## 2011-11-05 MED ORDER — FUROSEMIDE 10 MG/ML IJ SOLN: 80.0000 mg | Freq: Once | INTRAMUSCULAR | Status: DC

## 2011-11-05 MED ORDER — SODIUM CHLORIDE 0.9 % IJ SOLN
INTRAMUSCULAR | Status: AC
  Filled 2011-11-05: qty 10

## 2011-11-05 MED ORDER — SIMVASTATIN 20 MG PO TABS
20.0000 mg | ORAL_TABLET | Freq: Every day | ORAL | Status: DC
  Administered 2011-11-05 – 2011-11-09 (×4): 20 mg via ORAL
  Filled 2011-11-05 (×7): qty 1

## 2011-11-05 MED ORDER — CITALOPRAM HYDROBROMIDE 40 MG PO TABS
60.0000 mg | ORAL_TABLET | Freq: Every day | ORAL | Status: DC
  Administered 2011-11-05 – 2011-11-06 (×2): 60 mg via ORAL
  Filled 2011-11-05 (×2): qty 1

## 2011-11-05 MED ORDER — FUROSEMIDE 10 MG/ML IJ SOLN
80.0000 mg | Freq: Once | INTRAMUSCULAR | Status: AC
  Administered 2011-11-05: 80 mg via INTRAVENOUS
  Filled 2011-11-05: qty 4

## 2011-11-05 MED ORDER — PANTOPRAZOLE SODIUM 40 MG PO TBEC
40.0000 mg | DELAYED_RELEASE_TABLET | Freq: Every day | ORAL | Status: DC
  Administered 2011-11-05 – 2011-11-10 (×6): 40 mg via ORAL
  Filled 2011-11-05 (×5): qty 1

## 2011-11-05 MED ORDER — ALBUTEROL SULFATE (5 MG/ML) 0.5% IN NEBU
2.5000 mg | INHALATION_SOLUTION | Freq: Four times a day (QID) | RESPIRATORY_TRACT | Status: DC
  Administered 2011-11-05 – 2011-11-10 (×14): 2.5 mg via RESPIRATORY_TRACT
  Filled 2011-11-05 (×14): qty 0.5

## 2011-11-05 MED ORDER — DONEPEZIL HCL 10 MG PO TABS
10.0000 mg | ORAL_TABLET | Freq: Every day | ORAL | Status: DC
  Administered 2011-11-05 – 2011-11-09 (×5): 10 mg via ORAL
  Filled 2011-11-05 (×6): qty 1

## 2011-11-05 MED ORDER — IPRATROPIUM BROMIDE 0.02 % IN SOLN
0.5000 mg | Freq: Four times a day (QID) | RESPIRATORY_TRACT | Status: DC
  Administered 2011-11-05 – 2011-11-10 (×14): 0.5 mg via RESPIRATORY_TRACT
  Filled 2011-11-05 (×14): qty 2.5

## 2011-11-05 NOTE — Progress Notes (Signed)
Speech Language/Pathology Clinical/Bedside Swallow Evaluation Patient Details  Name: [name] MRN: [id number] DOB: 1922/05/05 Today's Date: 11/05/2011  Past Medical History:  Past Medical History  Diagnosis Date  . COPD (chronic obstructive pulmonary disease)   . Hyperlipidemia   . Hypertension   . Osteoporosis   . Hypothyroid   . Memory loss   . Anemia     Presumed GI on iron replacement hemoglobin in the 10 range  . Atrial flutter     Coumadin initiated 7/12 and followed by PCP; patient felt to be high risk for RFCA secondary to COPD;  echo was done 7/30:  EF 50% to 55%. Wall motion was normal ;there were no regional wall motion abnormalities, mild AS, MAC, mild LAE, mild RAE, PASP 35, mild pulmonary HTN  . On home O2   . Anxiety   . Depression   . CHF (congestive heart failure)   . Emphysema   . Chronic kidney disease   . Breast cancer   . DEMENTIA 11/04/11    "she has some significant dementia"   Past Surgical History:  Past Surgical History  Procedure Date  . Tonsillectomy   . Appendectomy   . Abdominal hysterectomy   . Mastectomy 1990    right  . Cataract extraction     "don't know if both eyes or if she had lenses put in"    Assessment/Recommendations/Treatment Plan Suspected Esophageal Findings Suspected Esophageal Findings: Belching  SLP Assessment Clinical Impression Statement: Pt. exhibited normal oropharyngeal swallow function.  Masticated cracker in timely manner without significant residue.  Pt. belched several times indicating possible esophageal impairments.  Pt. has COPD placing her at somewhat higher aspiration risk, however, pt. appeared safe with thin liquids and regular diet texture.  Discussed strategies with pt. and son to utilize for prevention of penetration/aspiration. Risk for Aspiration: Mild Other Related Risk Factors:  (COPD) No f/u speech therapy recommended  Recommendations Solid Consistency: Regular Liquid Consistency:  Thin Liquid Administration via: Cup;Straw (May need straw due to tremorous hands.  Small sips) Medication Administration: Other (Comment) (If difficulty with meds with thin, recommend whole in apples) Supervision: Intermittent supervision to cue for compensatory strategies Compensations: Slow rate;Small sips/bites Postural Changes and/or Swallow Maneuvers: Seated upright 90 degrees     Individuals Consulted Consulted and Agree with Results and Recommendations: Patient;Family member/caregiver  General  Other Pertinent Information: Swallow ordered to determine if pt. able to advance from clear liquids Type of Study: Bedside swallow evaluation Diet Prior to this Study: Other (Comment);Thin liquids (clear liquids) Respiratory Status: Supplemental O2 delivered via (comment) Behavior/Cognition: Alert Oral Cavity - Dentition: Adequate natural dentition Vision: Functional for self-feeding Patient Positioning: Upright in bed Baseline Vocal Quality: Hoarse Volitional Cough:  (Not assessed) Volitional Swallow: Able to elicit (Not assessed) Ice chips: Not tested  Oral Motor/Sensory Function  Labial ROM: Within Functional Limits Labial Symmetry: Within Functional Limits Labial Strength: Within Functional Limits Lingual ROM: Within Functional Limits Lingual Symmetry: Within Functional Limits Facial ROM: Within Functional Limits Facial Symmetry: Within Functional Limits Facial Strength: Within Functional Limits Mandible: Within Functional Limits  Consistency Results  Ice Chips Ice chips: Not tested  Thin Liquid Thin Liquid: Within functional limits Presentation: Straw Other Comments: Belched x 2  Nectar Thick Liquid Nectar Thick Liquid: Not tested  Honey Thick Liquid Honey Thick Liquid: Not tested  Puree Puree: Not tested  Solid Solid: Within functional limits   Royce Macadamia M.Ed [employer] [phone]  11/05/2011

## 2011-11-05 NOTE — Progress Notes (Signed)
Pt urine output 20 cc after one time dose of Lasix at 0115. Called e-link to inform Dr. Bard Herbert. No new orders received. Will continue to monitor.

## 2011-11-05 NOTE — Progress Notes (Signed)
[name]  [id number]  Report called to RN on receiving unit. VSS. Pt has no complaints of pain.Transferred with belongings to new [phone]. Family at bedside.  Elijah Birk Annette 11/21/20126:15 PM

## 2011-11-05 NOTE — Progress Notes (Signed)
ANTICOAGULATION CONSULT NOTE - Initial Consult  Pharmacy Consult for Coumadin Indication: atrial fibrillation  Allergies  Allergen Reactions  . Codeine     REACTION: nausea    Patient Measurements: Height: 5\' 5"  (165.1 cm) Weight: 202 lb 6.1 oz (91.8 kg) IBW/kg (Calculated) : 57    Vital Signs: Temp: 100 F (37.8 C) (11/21 1159) Temp src: Tympanic (11/21 1159) BP: 150/62 mmHg (11/21 1159) Pulse Rate: 73  (11/21 1159)  Labs:  Basename 11/05/11 0828 11/05/11 0630 11/05/11 11/04/11 1402 11/04/11 1340  HGB -- 9.1* -- 11.2* --  HCT -- 28.7* -- 33.0* 32.7*  PLT -- 139* -- -- 216  APTT -- -- -- -- --  LABPROT -- 39.4* -- -- 34.8*  INR -- 3.98* -- -- 3.39*  HEPARINUNFRC -- -- -- -- --  CREATININE 3.95* -- 3.91* 3.70* --  CKTOTAL -- -- -- -- 118  CKMB -- -- -- -- 4.0  TROPONINI -- -- -- -- <0.30   Estimated Creatinine Clearance: 11.2 ml/min (by C-G formula based on Cr of 3.95).  Medical History: Past Medical History  Diagnosis Date  . COPD (chronic obstructive pulmonary disease)   . Hyperlipidemia   . Hypertension   . Osteoporosis   . Hypothyroid   . Memory loss   . Anemia     Presumed GI on iron replacement hemoglobin in the 10 range  . Atrial flutter     Coumadin initiated 7/12 and followed by PCP; patient felt to be high risk for RFCA secondary to COPD;  echo was done 7/30:  EF 50% to 55%. Wall motion was normal ;there were no regional wall motion abnormalities, mild AS, MAC, mild LAE, mild RAE, PASP 35, mild pulmonary HTN  . On home O2   . Anxiety   . Depression   . CHF (congestive heart failure)   . Emphysema   . Chronic kidney disease   . Breast cancer   . DEMENTIA 11/04/11    "she has some significant dementia"    Medications:  Prescriptions prior to admission  Medication Sig Dispense Refill  . albuterol (PROAIR HFA) 108 (90 BASE) MCG/ACT inhaler Inhale 2 puffs into the lungs every 4 (four) hours as needed. For shortness of breath      . ALPRAZolam  (XANAX) 0.25 MG tablet Take 0.25 mg by mouth at bedtime as needed. For anxiety      . atorvastatin (LIPITOR) 20 MG tablet Take 20 mg by mouth daily.       Marland Kitchen CALCIUM PO Take 1 tablet by mouth 2 (two) times daily.        . Cholecalciferol (VITAMIN D PO) Take 1 tablet by mouth daily.        . citalopram (CELEXA) 20 MG tablet Take 60 mg by mouth daily.       . digoxin (LANOXIN) 0.125 MG tablet Take 1 tablet by mouth Daily.      Marland Kitchen donepezil (ARICEPT) 10 MG tablet Take 10 mg by mouth at bedtime.       . furosemide (LASIX) 40 MG tablet Take 40 mg by mouth daily.        Marland Kitchen ibuprofen (ADVIL,MOTRIN) 200 MG tablet Take 200 mg by mouth every 8 (eight) hours as needed. For pain      . IRON PO Take 1 tablet by mouth 2 (two) times daily.        Marland Kitchen KLOR-CON M20 20 MEQ tablet Take 20 mEq by mouth Twice daily.       Marland Kitchen  levothyroxine (SYNTHROID, LEVOTHROID) 100 MCG tablet Take 100 mcg by mouth daily.        Marland Kitchen lisinopril (PRINIVIL,ZESTRIL) 40 MG tablet Take 1 tablet (40 mg total) by mouth daily.  30 tablet  11  . tiotropium (SPIRIVA) 18 MCG inhalation capsule Place 18 mcg into inhaler and inhale daily.        . verapamil (CALAN-SR) 180 MG CR tablet Take 180 mg by mouth 2 (two) times daily.       Marland Kitchen warfarin (COUMADIN) 5 MG tablet Take 5-7.5 mg by mouth daily. Take 7.5mg  on Sunday and 5mg  all other days      . zolpidem (AMBIEN) 10 MG tablet Take 10 mg by mouth at bedtime as needed. For sleep        Assessment: 77 y/o female patient admitted with MODS and AMS, on chronic coumadin for h/o afib. INR currently supratherpeutic, no bleeding reported. Goal of Therapy:  INR 2-3   Plan:  No coumadin today. F/u daily protime.  Verlene Mayer, PharmD, BCPS Pager [phone]  11/05/2011,3:13 PM

## 2011-11-05 NOTE — Progress Notes (Signed)
   CARE MANAGEMENT NOTE 11/05/2011  Patient:  [name], [name]   Account Number:  [id number]  Date Initiated:  11/05/2011  Documentation initiated by:  [facility]  Subjective/Objective Assessment:   Found down floor - undetermined time - resp distress. Lives at home alone.     Action/Plan:   Anticipated DC Date:  11/11/2011   Anticipated DC Plan:  SKILLED NURSING FACILITY  In-house referral  Clinical Social Worker      DC Planning Services  CM consult      Choice offered to / List presented to:             Status of service:  In process, will continue to follow Medicare Important Message given?   (If response is "NO", the following Medicare IM given date fields will be blank) Date Medicare IM given:   Date Additional Medicare IM given:    Discharge Disposition:    Per UR Regulation:  Reviewed for med. necessity/level of care/duration of stay  Comments:  11/05/11 Nyair Depaulo,RN,BSN [id number] NOTIFIED BY MD THAT PT WILL NEED SKILLED NURSING FACILITY PLACEMENT.  CSW INFORMED OF NEW CONSULT.  CSW TO FACILITATE DC TO SNF WHEN MEDICALLY STABLE FOR DISCHARGE. Pager # [phone]  11-05-11 1:30pm Avie Arenas, RNBSN [phone] UR Completed.

## 2011-11-05 NOTE — Progress Notes (Signed)
Pt urine output 15cc at 0100 called E-link, informed Dr. Bard Herbert. New orders received. 80 mg lasix IV now. Will continue to monitor.

## 2011-11-05 NOTE — Progress Notes (Addendum)
77 yr old WF h/o COPD, Fib found down, MODS, resp failure, bradycardic. Last seen 11/19 evening 630 pm. Found down 11/20  On floor. No trauma apparat Huston Foley in ER. No fever, sput no n/v/d. no abdo pain reported.  Found with ARF, resp failure, hyperkalemia, and INR 3.4. PCCM to admit.  No other clear history reported, no slurred speech or facial droop. No bleeding.   Subjective : Today, 11/21, more alert.  HR better. Less cough .  No chest pain.  Passed swallow eval. Updated Son in room. He wants SNF rehab.   Filed Vitals:   11/05/11 0826  BP: 125/50  Pulse: 54  Temp: 98.9 F (37.2 C)  Resp: 20    General:awake,NAD. Off bipap , wants water Neuro: awake, follows commands HEENT: jvd min PULM: coarse bilat , decreased in bases CV: s1 s2 RRB distant GI: BS wnl no r, g Extremities: edema 3 plus    CBC    Component Value Date/Time   WBC 11.0* 11/05/2011 0630   RBC 2.98* 11/05/2011 0630   HGB 9.1* 11/05/2011 0630   HCT 28.7* 11/05/2011 0630   PLT 139* 11/05/2011 0630   MCV 96.3 11/05/2011 0630   MCH 30.5 11/05/2011 0630   MCHC 31.7 11/05/2011 0630   RDW 16.1* 11/05/2011 0630   LYMPHSABS 0.8 11/04/2011 1340   MONOABS 1.9* 11/04/2011 1340   EOSABS 0.0 11/04/2011 1340   BASOSABS 0.0 11/04/2011 1340    BMET    Component Value Date/Time   NA 139 11/05/2011 0828   K 5.1 11/05/2011 0828   CL 94* 11/05/2011 0828   CO2 35* 11/05/2011 0828   GLUCOSE 111* 11/05/2011 0828   BUN 49* 11/05/2011 0828   CREATININE 3.95* 11/05/2011 0828   CALCIUM 9.2 11/05/2011 0828   GFRNONAA 9* 11/05/2011 0828   GFRAA 11* 11/05/2011 0828   Radiology Ct Head Wo Contrast  11/04/2011  *RADIOLOGY REPORT*  Clinical Data: Fall.  Patient found down on floor.  CT HEAD WITHOUT CONTRAST  Technique:  Contiguous axial images were obtained from the base of the skull through the vertex without contrast.  Comparison: None.  Findings: The brain shows cortical atrophy as well as small vessel disease.  No evidence  of visible infarction, hemorrhage, mass effect or hydrocephalus.  No extra-axial fluid collections.  No evidence of mass lesion.  The skull is unremarkable.  IMPRESSION: Acute findings.  Atrophy and small vessel disease.  Original Report Authenticated By: Reola Calkins, M.D.   Portable Chest Xray In Am  11/05/2011  *RADIOLOGY REPORT*  Clinical Data: History of airspace disease.  Follow-up.  PORTABLE CHEST - 1 VIEW  Comparison: 11/04/2011.  Findings: Surgical clips are seen in right axillary region. Cardiac silhouette is upper range of normal size. Ectasia and nonaneurysmal calcification of the thoracic aorta are seen.  There is hazy infiltrative density in right upper lobe without consolidation.  Minimal left basilar subsegmental atelectasis is present.  No definite pleural effusion is seen.  Osteopenic appearance of bones.  Degenerative spondylosis.  IMPRESSION:  There is hazy infiltrative density in right upper lobe without consolidation.  Minimal left basilar subsegmental atelectasis is present. No new lesion is evident.  Original Report Authenticated By: Crawford Givens, M.D.   Dg Chest Port 1 View  11/04/2011  *RADIOLOGY REPORT*  Clinical Data: Shortness of breath.  PORTABLE CHEST - 1 VIEW  Comparison: 09/09/2011  Findings: Superimposed on stable COPD is probable component of congestive heart failure.  No pleural effusions are identified. The  heart size is stable.  IMPRESSION: Probable CHF superimposed on underlying COPD.  Original Report Authenticated By: Reola Calkins, M.D.    ABG    Component Value Date/Time   PHART 7.414* 10/24/2007 1542   PCO2ART 47.6* 10/24/2007 1542   PO2ART 52.0* 10/24/2007 1542   HCO3 30.4* 10/24/2007 1542   TCO2 27 11/04/2011 1402   O2SAT 87.0 10/24/2007 1542    Patient Active Problem List  Diagnoses  . ADENOCARCINOMA, BREAST, RIGHT  . HYPERLIPIDEMIA  . HYPERTENSION  . C O P D  . Atrial fib/flutter  . Peripheral edema  . Abnormal chest CT  . Bradycardia    . Acute renal failure  . Acute respiratory failure    A/P 1. Change in MS due to hypoxemia,  PNA, CT head neg.  Plan Monitor Neuro status  2. Acute Resp failure/ PNA RUL    NOS.   Off bipap 02 as needed  Pul toilet Cont ceftaz/vanco  3. hyperKalemia Resolving with bicarb>>>d/c NaHCO3 drip  4. Acute Renal Failure, ACEI? Induced, volume status? Rhabdo? nsc despite IVF Monitor, not a HD candidate.  Renal u/s will be obtained.  5. Bradycardia, r/o Dig tox, slow fib, priopr verapamil insetting ARF>>>better 11/21.    Tfr to tele bed  6. SuperTher INR . Int 3.98 Hold all anticoagualtion>>>pharm to dose coumadin  7. R/o aspiration No overt aspiration passed swallow eval. >>>advance diet  8. COPD, home o2 dependent Pul toilet Adjust BDs  9. CHF/AFIB rate controlled, HR better today off digoxin Echo July 2012: EF 50-55%    10. Conformed NCB status per son, treat medical otherwise

## 2011-11-06 ENCOUNTER — Inpatient Hospital Stay (HOSPITAL_COMMUNITY): Payer: Medicare Other

## 2011-11-06 DIAGNOSIS — N179 Acute kidney failure, unspecified: Secondary | ICD-10-CM

## 2011-11-06 DIAGNOSIS — J9600 Acute respiratory failure, unspecified whether with hypoxia or hypercapnia: Secondary | ICD-10-CM

## 2011-11-06 LAB — APTT: aPTT: 38 seconds — ABNORMAL HIGH (ref 24–37)

## 2011-11-06 LAB — BASIC METABOLIC PANEL
BUN: 42 mg/dL — ABNORMAL HIGH (ref 6–23)
CO2: 40 mEq/L (ref 19–32)
Chloride: 93 mEq/L — ABNORMAL LOW (ref 96–112)
Creatinine, Ser: 2.42 mg/dL — ABNORMAL HIGH (ref 0.50–1.10)
Glucose, Bld: 94 mg/dL (ref 70–99)

## 2011-11-06 LAB — CBC
Platelets: 128 10*3/uL — ABNORMAL LOW (ref 150–400)
RDW: 16 % — ABNORMAL HIGH (ref 11.5–15.5)
WBC: 9.1 10*3/uL (ref 4.0–10.5)

## 2011-11-06 LAB — PROTIME-INR: Prothrombin Time: 30.7 seconds — ABNORMAL HIGH (ref 11.6–15.2)

## 2011-11-06 MED ORDER — BIOTENE DRY MOUTH MT LIQD
15.0000 mL | Freq: Two times a day (BID) | OROMUCOSAL | Status: DC
  Administered 2011-11-06 – 2011-11-10 (×8): 15 mL via OROMUCOSAL

## 2011-11-06 MED ORDER — CITALOPRAM HYDROBROMIDE 20 MG PO TABS
20.0000 mg | ORAL_TABLET | Freq: Every day | ORAL | Status: DC
  Administered 2011-11-07 – 2011-11-10 (×4): 20 mg via ORAL
  Filled 2011-11-06 (×4): qty 1

## 2011-11-06 MED ORDER — VANCOMYCIN HCL IN DEXTROSE 1-5 GM/200ML-% IV SOLN
1000.0000 mg | INTRAVENOUS | Status: DC
  Administered 2011-11-06 – 2011-11-09 (×4): 1000 mg via INTRAVENOUS
  Filled 2011-11-06 (×4): qty 200

## 2011-11-06 MED ORDER — WARFARIN SODIUM 5 MG PO TABS
5.0000 mg | ORAL_TABLET | Freq: Once | ORAL | Status: AC
  Administered 2011-11-06: 5 mg via ORAL
  Filled 2011-11-06: qty 1

## 2011-11-06 NOTE — Progress Notes (Signed)
ANTICOAGULATION CONSULT NOTE - Follow Up Consult  Pharmacy Consult for Coumadin and Vancomycin Indication: atrial fibrillation and CAP  Allergies  Allergen Reactions  . Codeine     REACTION: nausea    Patient Measurements: Height: 5\' 5"  (165.1 cm) Weight: 200 lb 1.6 oz (90.765 kg) IBW/kg (Calculated) : 57  Adjusted Body Weight:    Vital Signs: Temp: 98.7 F (37.1 C) (11/22 0900) Temp src: Oral (11/22 0900) BP: 153/90 mmHg (11/22 0900) Pulse Rate: 100  (11/22 0900)  Labs:  Basename 11/06/11 0600 11/05/11 0828 11/05/11 0630 11/05/11 11/04/11 1402 11/04/11 1340  HGB 9.9* -- 9.1* -- -- --  HCT 30.8* -- 28.7* -- 33.0* --  PLT 128* -- 139* -- -- 216  APTT 38* -- -- -- -- --  LABPROT 30.7* -- 39.4* -- -- 34.8*  INR 2.89* -- 3.98* -- -- 3.39*  HEPARINUNFRC -- -- -- -- -- --  CREATININE 2.42* 3.95* -- 3.91* -- --  CKTOTAL -- -- -- -- -- 118  CKMB -- -- -- -- -- 4.0  TROPONINI -- -- -- -- -- <0.30   Estimated Creatinine Clearance: 18.2 ml/min (by C-G formula based on Cr of 2.42).   Medications:  Scheduled:    . ipratropium  0.5 mg Nebulization Q6H   And  . albuterol  2.5 mg Nebulization Q6H  . cefTAZidime (FORTAZ) IV  1 g Intravenous Q24H  . citalopram  60 mg Oral Daily  . donepezil  10 mg Oral QHS  . furosemide  80 mg Intravenous Once  . levothyroxine  100 mcg Oral Daily  . pantoprazole  40 mg Oral Q1200  . simvastatin  20 mg Oral q1800  . vancomycin  1,000 mg Intravenous Q24H  . DISCONTD: albuterol  2.5 mg Nebulization Q6H  . DISCONTD: ipratropium  0.5 mg Nebulization Q4H  . DISCONTD: pantoprazole (PROTONIX) IV  40 mg Intravenous QHS  . DISCONTD: vancomycin  1,000 mg Intravenous Q48H   Infusions:    . DISCONTD:  sodium bicarbonate infusion 75 mL/hr at 11/05/11 1227    Assessment: 1. Chronic Coumadin for afib. INR now in goal 2-3. Anemia noted. 2: CAP/aspiration PNA. Pt has only received 1 dose of Vancomycin with level 9.9 this am indicating adequate  clearance of drug. Scr down to 2.42 with spontaneous diuresis noted by MD. Will increase Vanco dose and target trough 15-20. Fortaz dose still ok for current renal function   Goal of Therapy:  INR 2-3 Vanco trough 15-20   Plan:  Coumadin 5mg  today x 1. Increase Vancomycin to 1g/24h. Trough after 3-5 doses.  Merilynn Finland, [employer] 11/06/2011,1:57 PM

## 2011-11-06 NOTE — Progress Notes (Signed)
Subjective: The patient is much improved today with normal mentation and answers questions appropriately.  Denies any neurologic deficits.  There is no increased work of breathing.  Objective: Vital signs in last 24 hours: Blood pressure 162/93, pulse 95, temperature 98.8 F (37.1 C), temperature source Oral, resp. rate 21, height 5\' 5"  (1.651 m), weight 90.765 kg (200 lb 1.6 oz), SpO2 97.00%.  Intake/Output from previous day: 11/21 0701 - 11/22 0700 In: 575 [I.V.:525; IV Piggyback:50] Out: 3825 [Urine:3825]   Physical Exam:   well-developed female in no acute distress Chest with a few scattered crackles, otherwise clear no wheezes or rhonchi Cardiac exam sounds regular today, not bradycardic currently Abdomen soft and nontender, good bowel sounds Lower extremities with mild edema, no cyanosis noted Alert and oriented, moves all 4 extremities.   Lab Results:  Basename 11/06/11 0600 11/05/11 0630 11/04/11 1402 11/04/11 1340  WBC 9.1 11.0* -- 16.0*  HGB 9.9* 9.1* 11.2* --  HCT 30.8* 28.7* 33.0* --  PLT 128* 139* -- 216   BMET  Basename 11/06/11 0600 11/05/11 0828 11/05/11  NA 141 139 136  K 3.6 5.1 5.7*  CL 93* 94* 93*  CO2 40* 35* 33*  GLUCOSE 94 111* 123*  BUN 42* 49* 49*  CREATININE 2.42* 3.95* 3.91*  CALCIUM 8.8 9.2 9.5    Studies/Results: Ct Head Wo Contrast  11/04/2011  *RADIOLOGY REPORT*  Clinical Data: Fall.  Patient found down on floor.  CT HEAD WITHOUT CONTRAST  Technique:  Contiguous axial images were obtained from the base of the skull through the vertex without contrast.  Comparison: None.  Findings: The brain shows cortical atrophy as well as small vessel disease.  No evidence of visible infarction, hemorrhage, mass effect or hydrocephalus.  No extra-axial fluid collections.  No evidence of mass lesion.  The skull is unremarkable.  IMPRESSION: Acute findings.  Atrophy and small vessel disease.  Original Report Authenticated By: Reola Calkins, M.D.   Dg  Chest Port 1 View  11/06/2011  *RADIOLOGY REPORT*  Clinical Data: Pneumonia  PORTABLE CHEST - 1 VIEW  Comparison: Yesterday  Findings: Stable focal irregular opacity in the right upper lobe. Increasing interstitial prominence and cardiomegaly.  Bronchitic changes.  No pneumothorax.  Left basilar scarring.  IMPRESSION: Suspect increasing mild interstitial edema.  Stable right upper lobe opacity.  Original Report Authenticated By: Donavan Burnet, M.D.   Portable Chest Xray In Am  11/05/2011  *RADIOLOGY REPORT*  Clinical Data: History of airspace disease.  Follow-up.  PORTABLE CHEST - 1 VIEW  Comparison: 11/04/2011.  Findings: Surgical clips are seen in right axillary region. Cardiac silhouette is upper range of normal size. Ectasia and nonaneurysmal calcification of the thoracic aorta are seen.  There is hazy infiltrative density in right upper lobe without consolidation.  Minimal left basilar subsegmental atelectasis is present.  No definite pleural effusion is seen.  Osteopenic appearance of bones.  Degenerative spondylosis.  IMPRESSION:  There is hazy infiltrative density in right upper lobe without consolidation.  Minimal left basilar subsegmental atelectasis is present. No new lesion is evident.  Original Report Authenticated By: Crawford Givens, M.D.   Dg Chest Port 1 View  11/04/2011  *RADIOLOGY REPORT*  Clinical Data: Shortness of breath.  PORTABLE CHEST - 1 VIEW  Comparison: 09/09/2011  Findings: Superimposed on stable COPD is probable component of congestive heart failure.  No pleural effusions are identified. The heart size is stable.  IMPRESSION: Probable CHF superimposed on underlying COPD.  Original Report Authenticated By:  Reola Calkins, M.D.    Assessment/Plan: Patient Active Hospital Problem List:  Acute respiratory failure (11/04/2011)    Assessment: the patient appears very comfortable on low-flow oxygen with adequate saturations, and there is no evidence for bronchospasm on exam.    Plan: continue supplemental oxygen as needed.  HYPERTENSION (11/30/2007)   Assessment: the patient's blood pressure is on the higher in today, but in an acceptable range.  We'll continue to monitor for now.  Acute renal failure (11/04/2011)    Assessment: the patient's renal function is slowly improving, and she had excellent spontaneous diuresis yesterday.  A renal ultrasound is pending to rule out obstruction, but she is clearly better today.  She is not getting IV fluids currently, but may have to reevaluate that if she continues to auto diurese spontaneously.   Plan: monitor BUN and creatinine.  May have to add back IV fluids if renal function worsens tomorrow and she continues to auto diurese.  Followup renal ultrasound.  Continue to hold ACE inhibitors.  CAP (community acquired pneumonia) (11/05/2011)   Assessment: the patient is afebrile on her current antibiotics, and is not coughing up purulent mucus currently.  Her right upper lobe on chest x-ray today appears stable.  There is a question of some mild interstitial edema, but this will be monitored.  Please see assessment and plan above on acute renal failure.    Plan: Continue to monitor chest x-ray as needed, no change in antibiotics currently    Barbaraann Share, M.D. 11/06/2011, 10:45 AM

## 2011-11-06 NOTE — Progress Notes (Signed)
eLink Physician-Brief Progress Note Patient Name: [name] DOB: May 04, 1922 MRN: [id number]  Date of Service  11/06/2011   HPI/Events of Note   Note pharmacy rec to reduce celexa to 20mg /d from 60/d  eICU Interventions  See orders    Intervention Category Minor Interventions: Routine modifications to care plan (e.g. PRN medications for pain, fever)  Shan Levans 11/06/2011, 5:02 PM

## 2011-11-06 NOTE — Progress Notes (Signed)
CRITICAL VALUE ALERT  Critical value received: C02 40  Date of notification:  11/06/11  Time of notification:  0705  Critical value read back:yesyes  Nurse who received alert:  vickie nylander  MD notified (1st page): PCCM  Time of first page:  0720  MD notified (2nd page):  Time of second page:  Responding MD:  PCCM  Time MD responded: [phone]

## 2011-11-07 DIAGNOSIS — E875 Hyperkalemia: Secondary | ICD-10-CM

## 2011-11-07 DIAGNOSIS — I498 Other specified cardiac arrhythmias: Secondary | ICD-10-CM

## 2011-11-07 LAB — BASIC METABOLIC PANEL
GFR calc Af Amer: 40 mL/min — ABNORMAL LOW (ref >90)
GFR calc non Af Amer: 35 mL/min — ABNORMAL LOW (ref >90)
Potassium: 3.4 mEq/L — ABNORMAL LOW (ref 3.5–5.1)
Sodium: 141 mEq/L (ref 135–145)

## 2011-11-07 LAB — CBC
Hemoglobin: 10.1 g/dL — ABNORMAL LOW (ref 12.0–15.0)
RBC: 3.3 MIL/uL — ABNORMAL LOW (ref 3.87–5.11)
WBC: 7.7 10*3/uL (ref 4.0–10.5)

## 2011-11-07 LAB — PROTIME-INR
INR: 1.79 — ABNORMAL HIGH (ref 0.00–1.49)
Prothrombin Time: 21.1 seconds — ABNORMAL HIGH (ref 11.6–15.2)

## 2011-11-07 MED ORDER — WARFARIN SODIUM 5 MG PO TABS
5.0000 mg | ORAL_TABLET | Freq: Once | ORAL | Status: AC
  Administered 2011-11-07: 5 mg via ORAL
  Filled 2011-11-07: qty 1

## 2011-11-07 NOTE — Progress Notes (Signed)
Anticoagulation & Antibiotics CONSULT NOTE - FOLLOW UP   Pharmacy Consult for Warfarin, Vancomycin,  Indication: Atrial Fibrillation and CAP  Allergies  Allergen Reactions  . Codeine     REACTION: nausea    Patient Measurements: Height: 5\' 5"  (165.1 cm) Weight: 197 lb 15.6 oz (89.8 kg) IBW/kg (Calculated) : 57   Vital Signs: Temp: 98.5 F (36.9 C) (11/23 0502) Temp src: Oral (11/23 0502) BP: 190/88 mmHg (11/23 0502) Pulse Rate: 81  (11/23 0502) Intake/Output from previous day: 11/22 0701 - 11/23 0700 In: 580 [P.O.:580] Out: 2027 [Urine:2025; Stool:2] Intake/Output from this shift:    Labs:  Basename 11/07/11 0640 11/06/11 0600 11/05/11 0828 11/05/11 0630  WBC 7.7 9.1 -- 11.0*  HGB 10.1* 9.9* -- 9.1*  HCT 31.5* 30.8* -- 28.7*  PLT 136* 128* -- 139*  APTT -- 38* -- --  CREATININE 1.33* 2.42* 3.95* --  LABCREA -- -- -- --  CREATININE 1.33* 2.42* 3.95* --  CREAT24HRUR -- -- -- --  MG -- -- -- 2.0  PHOS -- -- -- 3.3  ALBUMIN -- -- -- --  PROT -- -- -- --  ALBUMIN -- -- -- --  AST -- -- -- --  ALT -- -- -- --  ALKPHOS -- -- -- --  BILITOT -- -- -- --  BILIDIR -- -- -- --  IBILI -- -- -- --   Estimated Creatinine Clearance: 33 ml/min (by C-G formula based on Cr of 1.33).   Microbiology: Recent Results (from the past 720 hour(s))  URINE CULTURE     Status: Normal   Collection Time   11/04/11  2:24 PM      Component Value Range Status Comment   Specimen Description URINE, CATHETERIZED   Final    Special Requests NONE   Final    Setup Time [id number]   Final    Colony Count NO GROWTH   Final    Culture NO GROWTH   Final    Report Status 11/05/2011 FINAL   Final   CULTURE, BLOOD (ROUTINE X 2)     Status: Normal (Preliminary result)   Collection Time   11/04/11  4:44 PM      Component Value Range Status Comment   Specimen Description BLOOD ARM RIGHT   Final    Special Requests BOTTLES DRAWN AEROBIC AND ANAEROBIC 10CC   Final    Setup Time [id number]    Final    Culture     Final    Value:        BLOOD CULTURE RECEIVED NO GROWTH TO DATE CULTURE WILL BE HELD FOR 5 DAYS BEFORE ISSUING A FINAL NEGATIVE REPORT   Report Status PENDING   Incomplete   CULTURE, BLOOD (ROUTINE X 2)     Status: Normal (Preliminary result)   Collection Time   11/04/11  4:51 PM      Component Value Range Status Comment   Specimen Description BLOOD HAND RIGHT   Final    Special Requests BOTTLES DRAWN AEROBIC ONLY 7CC   Final    Setup Time [id number]   Final    Culture     Final    Value:        BLOOD CULTURE RECEIVED NO GROWTH TO DATE CULTURE WILL BE HELD FOR 5 DAYS BEFORE ISSUING A FINAL NEGATIVE REPORT   Report Status PENDING   Incomplete   MRSA PCR SCREENING     Status: Normal   Collection Time   11/04/11  6:46 PM  Component Value Range Status Comment   MRSA by PCR NEGATIVE  NEGATIVE  Final     Medications:  Prescriptions prior to admission  Medication Sig Dispense Refill  . albuterol (PROAIR HFA) 108 (90 BASE) MCG/ACT inhaler Inhale 2 puffs into the lungs every 4 (four) hours as needed. For shortness of breath      . ALPRAZolam (XANAX) 0.25 MG tablet Take 0.25 mg by mouth at bedtime as needed. For anxiety      . atorvastatin (LIPITOR) 20 MG tablet Take 20 mg by mouth daily.       Marland Kitchen CALCIUM PO Take 1 tablet by mouth 2 (two) times daily.        . Cholecalciferol (VITAMIN D PO) Take 1 tablet by mouth daily.        . citalopram (CELEXA) 20 MG tablet Take 60 mg by mouth daily.       . digoxin (LANOXIN) 0.125 MG tablet Take 1 tablet by mouth Daily.      Marland Kitchen donepezil (ARICEPT) 10 MG tablet Take 10 mg by mouth at bedtime.       . furosemide (LASIX) 40 MG tablet Take 40 mg by mouth daily.        Marland Kitchen ibuprofen (ADVIL,MOTRIN) 200 MG tablet Take 200 mg by mouth every 8 (eight) hours as needed. For pain      . IRON PO Take 1 tablet by mouth 2 (two) times daily.        Marland Kitchen KLOR-CON M20 20 MEQ tablet Take 20 mEq by mouth Twice daily.       Marland Kitchen levothyroxine (SYNTHROID,  LEVOTHROID) 100 MCG tablet Take 100 mcg by mouth daily.        Marland Kitchen lisinopril (PRINIVIL,ZESTRIL) 40 MG tablet Take 1 tablet (40 mg total) by mouth daily.  30 tablet  11  . tiotropium (SPIRIVA) 18 MCG inhalation capsule Place 18 mcg into inhaler and inhale daily.        . verapamil (CALAN-SR) 180 MG CR tablet Take 180 mg by mouth 2 (two) times daily.       Marland Kitchen warfarin (COUMADIN) 5 MG tablet Take 5-7.5 mg by mouth daily. Take 7.5mg  on Sunday and 5mg  all other days      . zolpidem (AMBIEN) 10 MG tablet Take 10 mg by mouth at bedtime as needed. For sleep       Scheduled:    . ipratropium  0.5 mg Nebulization Q6H   And  . albuterol  2.5 mg Nebulization Q6H  . antiseptic oral rinse  15 mL Mouth Rinse BID  . cefTAZidime (FORTAZ) IV  1 g Intravenous Q24H  . citalopram  20 mg Oral Daily  . donepezil  10 mg Oral QHS  . levothyroxine  100 mcg Oral Daily  . pantoprazole  40 mg Oral Q1200  . simvastatin  20 mg Oral q1800  . vancomycin  1,000 mg Intravenous Q24H  . warfarin  5 mg Oral ONCE-1800  . DISCONTD: citalopram  60 mg Oral Daily  . DISCONTD: vancomycin  1,000 mg Intravenous Q48H    Assessment: Patient Active Problem List  Diagnoses  . ADENOCARCINOMA, BREAST, RIGHT  . HYPERLIPIDEMIA  . HYPERTENSION  . C O P D  . Atrial fib/flutter  . Peripheral edema  . Abnormal chest CT  . Bradycardia  . Acute renal failure  . Acute respiratory failure  . CAP (community acquired pneumonia)   77 yo F admitted 11/20 after being found down.   Pharmacy Problem list:  Atrial Fibrillation: warfarin, INR supertherapeutic on admission now < goal, will continue with home dose and give 5 mg today CAP: D#4 Vancomycin, Fortaz, afeb, WBC declining, CrCl continues to improve, Blood x 2 and urine from 11/20 no growth to date. Respiratory culture ordered, not drawn.   Anxiety,Depression,Memory Loss: Citalopram, donepezil Cardiology: Hyperlipidemia: Simvastatin Elevated BNP, Last ECHO 07/14/11 EF 50-55% ACEI not  clearly indicated, also contraindicated currently given acute renal failure on admission. Hypothyroid: Levothroxine Best Practices/DVT Prophylaxsis: Protonix, INR therapeutic  Goal of Therapy:  INR goal 2-3 and Vancomycin trough 15-20 mcg/ml  Plan:  1. Warfarin 5 mg PO x 1 at 1800 2. Will follow SCr closely, if renal function remains stable or continues to improve will adjust Vancomycin and ceftazidime dose. 3. Follow up with MD regarding plan for length of therapy.  Lovenia Kim Pharm.D., BCPS 11/07/2011 9:59 AM [phone]  Brexton Sofia Merrily Brittle 11/07/2011,9:35 AM

## 2011-11-07 NOTE — Progress Notes (Signed)
CSW started SNF placement process. Please consider ordering PT/OT consult for SNF placement.

## 2011-11-07 NOTE — Progress Notes (Signed)
Hx: 77 yo woman, hx COPD, A Fib. Admitted with altered MS, renal failure, resp failure.  Subjective: Son at bedside, reports pt much improved.  Wants pt to go to [facility] for acute rehab. (grandson is Interior and spatial designer of PT there)  Objective: Vital signs in last 24 hours: Blood pressure 117/75, pulse 76, temperature 98.8 F (37.1 C), temperature source Oral, resp. rate 20, height 5\' 5"  (1.651 m), weight 197 lb 15.6 oz (89.8 kg), SpO2 90.00%.  Intake/Output from previous day: 11/22 0701 - 11/23 0700 In: 580 [P.O.:580] Out: 2027 [Urine:2025; Stool:2]   Physical Exam:  well-developed elderly female in no acute distress Resp's even/non-labored,lungs bilaterally diminished but clear S1S2 rrr, no m/r/g Abdomen soft and nontender, good bowel sounds Lower extremities with mild edema, no cyanosis noted Alert and oriented, mild situational forgetfulness, moves all 4 extremities.   Lab Results:  Basename 11/07/11 0640 11/06/11 0600 11/05/11 0630  WBC 7.7 9.1 11.0*  HGB 10.1* 9.9* 9.1*  HCT 31.5* 30.8* 28.7*  PLT 136* 128* 139*   BMET  Basename 11/07/11 0640 11/06/11 0600 11/05/11 0828  NA 141 141 139  K 3.4* 3.6 5.1  CL 96 93* 94*  CO2 39* 40* 35*  GLUCOSE 84 94 111*  BUN 26* 42* 49*  CREATININE 1.33* 2.42* 3.95*  CALCIUM 8.5 8.8 9.2    Studies/Results: Dg Chest Port 1 View  11/06/2011  *RADIOLOGY REPORT*  Clinical Data: Pneumonia  PORTABLE CHEST - 1 VIEW  Comparison: Yesterday  Findings: Stable focal irregular opacity in the right upper lobe. Increasing interstitial prominence and cardiomegaly.  Bronchitic changes.  No pneumothorax.  Left basilar scarring.  IMPRESSION: Suspect increasing mild interstitial edema.  Stable right upper lobe opacity.  Original Report Authenticated By: Donavan Burnet, M.D.    Assessment/Plan:  1. Acute respiratory failure (11/04/2011), COPD Assessment: the patient appears very comfortable on low-flow oxygen with adequate saturations, and there is    no evidence for bronchospasm on exam.    Plan: continue supplemental oxygen as needed.  2. HYPERTENSION (11/30/2007) Assessment: the patient has had periodic elevated BP's, but in an acceptable range.  Now, WNL.  ?accuracy of readings. We'll continue to monitor for now.  3. Acute renal failure (11/04/2011) Assessment: the patient's renal function is much improved.  A renal ultrasound is pending to rule out obstruction, but she is clearly better today.  She is not getting IV fluids currently, but may have to reevaluate that if she continues to auto diurese spontaneously.    Plan: monitor BUN and creatinine.  May have to add back IV fluids if renal function worsens and she continues to auto diurese.  Followup renal ultrasound.  Continue to hold ACE inhibitors.  4. CAP (community acquired pneumonia) (11/05/2011) Assessment: the patient is afebrile on her current antibiotics, and is not coughing up purulent mucus currently.  Her right upper lobe on chest x-ray today appears stable.  There is a question of some mild interstitial edema, but this will be monitored.  Please see assessment and plan above on acute renal failure.  Plan: Continue to monitor chest x-ray as needed, no change in antibiotics currently  5. Disposition:   Assessment:  Likely needs short term rehab Plan: will attempt to place at [facility] for short term rehab.  Case Mgmt & SW working on process.  Likely will be Monday for d/c.   Canary Brim, NP-C [facility] Pgr: [phone]   Attending Addendum:  I have seen the patient,  discussed the issues, test results and plans with B. Veleta Miners, NP. I agree with the Assessment and Plan as outlined above.   Anissia Wessells S. 11/07/2011 3:53 PM

## 2011-11-08 ENCOUNTER — Inpatient Hospital Stay (HOSPITAL_COMMUNITY): Payer: Medicare Other

## 2011-11-08 LAB — CBC
HCT: 33.6 % — ABNORMAL LOW (ref 36.0–46.0)
Hemoglobin: 10.9 g/dL — ABNORMAL LOW (ref 12.0–15.0)
RDW: 15.7 % — ABNORMAL HIGH (ref 11.5–15.5)
WBC: 7.7 10*3/uL (ref 4.0–10.5)

## 2011-11-08 LAB — BASIC METABOLIC PANEL
Chloride: 95 mEq/L — ABNORMAL LOW (ref 96–112)
Creatinine, Ser: 1.01 mg/dL (ref 0.50–1.10)
GFR calc Af Amer: 56 mL/min — ABNORMAL LOW (ref >90)
Potassium: 3.4 mEq/L — ABNORMAL LOW (ref 3.5–5.1)

## 2011-11-08 MED ORDER — ENSURE IMMUNE HEALTH PO LIQD: 237.0000 mL | Freq: Three times a day (TID) | ORAL | Status: DC | PRN

## 2011-11-08 MED ORDER — WARFARIN SODIUM 5 MG PO TABS
5.0000 mg | ORAL_TABLET | Freq: Once | ORAL | Status: AC
  Administered 2011-11-08: 5 mg via ORAL
  Filled 2011-11-08 (×2): qty 1

## 2011-11-08 NOTE — Progress Notes (Signed)
Physical Therapy Evaluation Patient Details Name: [name] MRN: [id number] DOB: 1922/05/01 Today's Date: 11/08/2011  Problem List:  Patient Active Problem List  Diagnoses  . ADENOCARCINOMA, BREAST, RIGHT  . HYPERLIPIDEMIA  . HYPERTENSION  . C O P D  . Atrial fib/flutter  . Peripheral edema  . Abnormal chest CT  . Bradycardia  . Acute renal failure  . Acute respiratory failure  . CAP (community acquired pneumonia)    Past Medical History:  Past Medical History  Diagnosis Date  . COPD (chronic obstructive pulmonary disease)   . Hyperlipidemia   . Hypertension   . Osteoporosis   . Hypothyroid   . Memory loss   . Anemia     Presumed GI on iron replacement hemoglobin in the 10 range  . Atrial flutter     Coumadin initiated 7/12 and followed by PCP; patient felt to be high risk for RFCA secondary to COPD;  echo was done 7/30:  EF 50% to 55%. Wall motion was normal ;there were no regional wall motion abnormalities, mild AS, MAC, mild LAE, mild RAE, PASP 35, mild pulmonary HTN  . On home O2   . Anxiety   . Depression   . CHF (congestive heart failure)   . Emphysema   . Chronic kidney disease   . Breast cancer   . DEMENTIA 11/04/11    "she has some significant dementia"   Past Surgical History:  Past Surgical History  Procedure Date  . Tonsillectomy   . Appendectomy   . Abdominal hysterectomy   . Mastectomy 1990    right  . Cataract extraction     "don't know if both eyes or if she had lenses put in"    PT Assessment/Plan/Recommendation PT Assessment Clinical Impression Statement: Pt presents to PT with decr mobility due to deconditioning after illness and bedrest.  Needs skilled PT to incr indepedence so pt can eventually return home.  Recommend [facility] for rehab. PT Recommendation/Assessment: Patient will need skilled PT in the acute care venue PT Problem List: Decreased strength;Decreased activity tolerance;Decreased knowledge of use of DME;Decreased  balance;Decreased mobility PT Therapy Diagnosis : Difficulty walking;Generalized weakness PT Plan PT Frequency: Min 3X/week PT Treatment/Interventions: Gait training;Patient/family education;Functional mobility training;Therapeutic exercise;Balance training PT Recommendation Follow Up Recommendations: Skilled nursing facility Equipment Recommended: Defer to next venue PT Goals  Acute Rehab PT Goals PT Goal Formulation: With patient Time For Goal Achievement: 7 days Pt will go Supine/Side to Sit: with modified independence;with rail PT Goal: Supine/Side to Sit - Progress: Not met Pt will go Sit to Supine/Side: with supervision;with rail PT Goal: Sit to Supine/Side - Progress: Not met Pt will Transfer Sit to Stand/Stand to Sit: with modified independence PT Transfer Goal: Sit to Stand/Stand to Sit - Progress: Not met Pt will Ambulate: 16 - 50 feet;with supervision;with rolling walker PT Goal: Ambulate - Progress: Not met  PT Evaluation Precautions/Restrictions  Precautions Precautions: Fall Prior Functioning  Home Living Lives With: Alone (pt going to [facility]) Receives Help From: Family Home Adaptive Equipment: Walker - rolling Prior Function Level of Independence: Requires assistive device for independence;Independent with transfers;Independent with gait Vocation: Retired Financial risk analyst Arousal/Alertness: Awake/alert Overall Cognitive Status: Appears within functional limits for tasks assessed Orientation Level: Oriented X4 Sensation/Coordination   Extremity Assessment RLE Strength RLE Overall Strength Comments: grossly 3+/5 LLE Strength LLE Overall Strength Comments: grossly 3+/5 Mobility (including Balance) Bed Mobility Bed Mobility: Yes Sit to Supine - Left: 4: Min assist;HOB flat Sit to Supine -  Left Details (indicate cue type and reason): assist to lift legs Transfers Transfers: Yes Sit to Stand: 4: Min assist;From chair/3-in-1;With upper extremity  assist;With armrests Sit to Stand Details (indicate cue type and reason): Verbal/tactile cues for hand placement Stand to Sit: 4: Min assist;With upper extremity assist;To bed;To chair/3-in-1;With armrests Stand to Sit Details: Verbal/tactile cues for hand placement Ambulation/Gait Ambulation/Gait: Yes Ambulation/Gait Assistance: 4: Min assist Ambulation/Gait Assistance Details (indicate cue type and reason): Verbal cues to stay closer to walker and stand more erect Ambulation Distance (Feet): 15 Feet Assistive device: Rolling walker Gait Pattern: Decreased step length - right;Decreased step length - left;Trunk flexed (wide base) Gait velocity: slow cadence  Balance Balance Assessed: Yes Static Standing Balance Static Standing - Level of Assistance: 4: Min assist (with pt holding walker) Exercise    End of Session PT - End of Session Equipment Utilized During Treatment: Gait belt Activity Tolerance: Patient limited by fatigue Patient left: in bed;with call bell in reach;with family/visitor present Nurse Communication: Mobility status for ambulation General Behavior During Session: [facility] for tasks performed Cognition: [facility] for tasks performed  [facility] 11/08/2011, 5:10 PM [employer] PT [phone]

## 2011-11-08 NOTE — Progress Notes (Signed)
Anticoagulation & Antibiotics CONSULT NOTE - FOLLOW UP   Pharmacy Consult for Warfarin Indication: Atrial Fibrillation   Allergies  Allergen Reactions  . Codeine     REACTION: nausea    Patient Measurements: Height: 5\' 5"  (165.1 cm) Weight: 197 lb 15.6 oz (89.8 kg) IBW/kg (Calculated) : 57   Vital Signs: Temp: 97.8 F (36.6 C) (11/24 1400) Temp src: Oral (11/24 1400) BP: 145/82 mmHg (11/24 1400) Pulse Rate: 78  (11/24 1400) Intake/Output from previous day: 11/23 0701 - 11/24 0700 In: 970 [P.O.:720; IV Piggyback:250] Out: 2626 [Urine:2625; Stool:1] Intake/Output from this shift: Total I/O In: 360 [P.O.:360] Out: 550 [Urine:550]  Labs:  [facility] 11/08/11 0630 11/07/11 0640 11/06/11 0600  WBC 7.7 7.7 9.1  HGB 10.9* 10.1* 9.9*  HCT 33.6* 31.5* 30.8*  PLT 153 136* 128*  APTT -- -- 38*  CREATININE 1.01 1.33* 2.42*  LABCREA -- -- --  CREATININE 1.01 1.33* 2.42*  CREAT24HRUR -- -- --  MG -- -- --  PHOS -- -- --  ALBUMIN -- -- --  PROT -- -- --  ALBUMIN -- -- --  AST -- -- --  ALT -- -- --  ALKPHOS -- -- --  BILITOT -- -- --  BILIDIR -- -- --  IBILI -- -- --   Estimated Creatinine Clearance: 43.4 ml/min (by C-G formula based on Cr of 1.01).   Microbiology: Recent Results (from the past 720 hour(s))  URINE CULTURE     Status: Normal   Collection Time   11/04/11  2:24 PM      Component Value Range Status Comment   Specimen Description URINE, CATHETERIZED   Final    Special Requests NONE   Final    Setup Time [id number]   Final    Colony Count NO GROWTH   Final    Culture NO GROWTH   Final    Report Status 11/05/2011 FINAL   Final   CULTURE, BLOOD (ROUTINE X 2)     Status: Normal (Preliminary result)   Collection Time   11/04/11  4:44 PM      Component Value Range Status Comment   Specimen Description BLOOD ARM RIGHT   Final    Special Requests BOTTLES DRAWN AEROBIC AND ANAEROBIC 10CC   Final    Setup Time [id number]   Final    Culture     Final      Value:        BLOOD CULTURE RECEIVED NO GROWTH TO DATE CULTURE WILL BE HELD FOR 5 DAYS BEFORE ISSUING A FINAL NEGATIVE REPORT   Report Status PENDING   Incomplete   CULTURE, BLOOD (ROUTINE X 2)     Status: Normal (Preliminary result)   Collection Time   11/04/11  4:51 PM      Component Value Range Status Comment   Specimen Description BLOOD HAND RIGHT   Final    Special Requests BOTTLES DRAWN AEROBIC ONLY 7CC   Final    Setup Time [id number]   Final    Culture     Final    Value:        BLOOD CULTURE RECEIVED NO GROWTH TO DATE CULTURE WILL BE HELD FOR 5 DAYS BEFORE ISSUING A FINAL NEGATIVE REPORT   Report Status PENDING   Incomplete   MRSA PCR SCREENING     Status: Normal   Collection Time   11/04/11  6:46 PM      Component Value Range Status Comment   MRSA by PCR NEGATIVE  NEGATIVE  Final     Medications:  Prescriptions prior to admission  Medication Sig Dispense Refill  . albuterol (PROAIR HFA) 108 (90 BASE) MCG/ACT inhaler Inhale 2 puffs into the lungs every 4 (four) hours as needed. For shortness of breath      . ALPRAZolam (XANAX) 0.25 MG tablet Take 0.25 mg by mouth at bedtime as needed. For anxiety      . atorvastatin (LIPITOR) 20 MG tablet Take 20 mg by mouth daily.       Marland Kitchen CALCIUM PO Take 1 tablet by mouth 2 (two) times daily.        . Cholecalciferol (VITAMIN D PO) Take 1 tablet by mouth daily.        . citalopram (CELEXA) 20 MG tablet Take 60 mg by mouth daily.       . digoxin (LANOXIN) 0.125 MG tablet Take 1 tablet by mouth Daily.      Marland Kitchen donepezil (ARICEPT) 10 MG tablet Take 10 mg by mouth at bedtime.       . furosemide (LASIX) 40 MG tablet Take 40 mg by mouth daily.        Marland Kitchen ibuprofen (ADVIL,MOTRIN) 200 MG tablet Take 200 mg by mouth every 8 (eight) hours as needed. For pain      . IRON PO Take 1 tablet by mouth 2 (two) times daily.        Marland Kitchen KLOR-CON M20 20 MEQ tablet Take 20 mEq by mouth Twice daily.       Marland Kitchen levothyroxine (SYNTHROID, LEVOTHROID) 100 MCG tablet  Take 100 mcg by mouth daily.        Marland Kitchen lisinopril (PRINIVIL,ZESTRIL) 40 MG tablet Take 1 tablet (40 mg total) by mouth daily.  30 tablet  11  . tiotropium (SPIRIVA) 18 MCG inhalation capsule Place 18 mcg into inhaler and inhale daily.        . verapamil (CALAN-SR) 180 MG CR tablet Take 180 mg by mouth 2 (two) times daily.       Marland Kitchen warfarin (COUMADIN) 5 MG tablet Take 5-7.5 mg by mouth daily. Take 7.5mg  on Sunday and 5mg  all other days      . zolpidem (AMBIEN) 10 MG tablet Take 10 mg by mouth at bedtime as needed. For sleep       Scheduled:     . ipratropium  0.5 mg Nebulization Q6H   And  . albuterol  2.5 mg Nebulization Q6H  . antiseptic oral rinse  15 mL Mouth Rinse BID  . cefTAZidime (FORTAZ) IV  1 g Intravenous Q24H  . citalopram  20 mg Oral Daily  . donepezil  10 mg Oral QHS  . levothyroxine  100 mcg Oral Daily  . pantoprazole  40 mg Oral Q1200  . simvastatin  20 mg Oral q1800  . vancomycin  1,000 mg Intravenous Q24H  . warfarin  5 mg Oral ONCE-1800  . warfarin  5 mg Oral ONCE-1800    Assessment: Patient Active Problem List  Diagnoses  . ADENOCARCINOMA, BREAST, RIGHT  . HYPERLIPIDEMIA  . HYPERTENSION  . C O P D  . Atrial fib/flutter  . Peripheral edema  . Abnormal chest CT  . Bradycardia  . Acute renal failure  . Acute respiratory failure  . CAP (community acquired pneumonia)   77 yo F admitted 11/20 after being found down.   Pharmacy Problem list: Atrial Fibrillation: warfarin, INR supertherapeutic on admission now < goal. No INR performed today.  Will continue with home dose and  give 5 mg today CAP: D#5 Vancomycin, Fortaz, afeb, WBC declining, CrCl continues to improve (Scr down to 1.01), Blood x 2 and urine from 11/20 no growth to date. Respiratory culture ordered, not drawn.   Anxiety,Depression,Memory Loss: Citalopram, donepezil Cardiology: Hyperlipidemia: Simvastatin Elevated BNP, Last ECHO 07/14/11 EF 50-55% ACEI not clearly indicated, also contraindicated  currently given acute renal failure on admission. Hypothyroid: Levothroxine Best Practices/DVT Prophylaxsis: Protonix, INR therapeutic  Goal of Therapy:  INR goal 2-3   Plan:  1. Warfarin 5 mg PO x 1 at 1800  Merilynn Finland, [employer] 11/08/2011,2:43 PM

## 2011-11-08 NOTE — Progress Notes (Signed)
INITIAL ADULT NUTRITION ASSESSMENT Date: 11/08/2011   Time: 11:19 AM  Reason for Assessment: Consult for assessment of nutritional status and requirements  ASSESSMENT: Female 77 y.o.  Dx: Acute respiratory failure  Hx:  Past Medical History  Diagnosis Date  . COPD (chronic obstructive pulmonary disease)   . Hyperlipidemia   . Hypertension   . Osteoporosis   . Hypothyroid   . Memory loss   . Anemia     Presumed GI on iron replacement hemoglobin in the 10 range  . Atrial flutter     Coumadin initiated 7/12 and followed by PCP; patient felt to be high risk for RFCA secondary to COPD;  echo was done 7/30:  EF 50% to 55%. Wall motion was normal ;there were no regional wall motion abnormalities, mild AS, MAC, mild LAE, mild RAE, PASP 35, mild pulmonary HTN  . On home O2   . Anxiety   . Depression   . CHF (congestive heart failure)   . Emphysema   . Chronic kidney disease   . Breast cancer   . DEMENTIA 11/04/11    "she has some significant dementia"    Related Meds:     . ipratropium  0.5 mg Nebulization Q6H   And  . albuterol  2.5 mg Nebulization Q6H  . antiseptic oral rinse  15 mL Mouth Rinse BID  . cefTAZidime (FORTAZ) IV  1 g Intravenous Q24H  . citalopram  20 mg Oral Daily  . donepezil  10 mg Oral QHS  . levothyroxine  100 mcg Oral Daily  . pantoprazole  40 mg Oral Q1200  . simvastatin  20 mg Oral q1800  . vancomycin  1,000 mg Intravenous Q24H  . warfarin  5 mg Oral ONCE-1800     Ht: 5\' 5"  (165.1 cm)  Wt: 197 lb 15.6 oz (89.8 kg)  Ideal Wt: 57 kg  % Ideal Wt: 158%  Usual Wt: 194 lb October 2012 % Usual Wt: 102%  Body mass index is 32.94 kg/(m^2).  Consistent with obesity I.  Food/Nutrition Related Hx: no nutrition problems noted.  Lives at home alone with son next door.  Plan for d/c to short-term SNF rehab.  Labs:  BMET    Component Value Date/Time   NA 138 11/08/2011 0630   K 3.4* 11/08/2011 0630   CL 95* 11/08/2011 0630   CO2 35* 11/08/2011  0630   GLUCOSE 110* 11/08/2011 0630   BUN 17 11/08/2011 0630   CREATININE 1.01 11/08/2011 0630   CALCIUM 8.5 11/08/2011 0630   GFRNONAA 49* 11/08/2011 0630   GFRAA 56* 11/08/2011 0630   I/O last 3 completed shifts: In: 1210 [P.O.:960; IV Piggyback:250] Out: 3951 [Urine:3950; Stool:1] Total I/O In: 120 [P.O.:120] Out: 550 [Urine:550]  Diet Order: Sodium Restricted  10-50% meal completions recorded  Supplements/Tube Feeding: none  IVF:  none  Estimated Nutritional Needs:   Kcal:1450-1600 kcal/day Protein:75-85 grams/day Fluid:1.6 L/day  NUTRITION DIAGNOSIS: -Inadequate oral intake (NI-2.1)  RELATED TO: ?cognitive status, confusion on admission (now improved)  AS EVIDENCE BY: 10-50% meal completions  MONITORING/EVALUATION(Goals): Pt to meet minimum estimated needs with po intake and supplements  EDUCATION NEEDS: -No education needs identified at this time  INTERVENTION: Ensure tid prn if <50% meal completion.  Dietitian [phone]  DOCUMENTATION CODES Per approved criteria  -Obesity Unspecified    Otto Herb 11/08/2011, 11:19 AM

## 2011-11-08 NOTE — Progress Notes (Signed)
Hx: 77 yo woman, hx COPD, A Fib. Admitted with altered MS, renal failure, resp failure.  Subjective: Sitting in chair on nasal 02 nad wants to go home. No cp or cough or sob  Objective: Vital signs in last 24 hours: Blood pressure 154/80, pulse 82, temperature 98.4 F (36.9 C), temperature source Oral, resp. rate 18, height 5\' 5"  (1.651 m), weight 197 lb 15.6 oz (89.8 kg), SpO2 93.00%.  Intake/Output from previous day: 11/23 0701 - 11/24 0700 In: 970 [P.O.:720; IV Piggyback:250] Out: 2626 [Urine:2625; Stool:1]   Physical Exam:  well-developed elderly female in no acute distress Resp's even/non-labored,lungs bilaterally diminished but clear S1S2 rrr, no m/r/g Abdomen soft and nontender, good bowel sounds Lower extremities with mild edema, no cyanosis noted Alert and oriented, mild situational forgetfulness, moves all 4 extremities.   Lab Results:  Basename 11/08/11 0630 11/07/11 0640 11/06/11 0600  WBC 7.7 7.7 9.1  HGB 10.9* 10.1* 9.9*  HCT 33.6* 31.5* 30.8*  PLT 153 136* 128*   BMET  Basename 11/08/11 0630 11/07/11 0640 11/06/11 0600  NA 138 141 141  K 3.4* 3.4* 3.6  CL 95* 96 93*  CO2 35* 39* 40*  GLUCOSE 110* 84 94  BUN 17 26* 42*  CREATININE 1.01 1.33* 2.42*  CALCIUM 8.5 8.5 8.8    Studies/Results: Dg Chest 2 View  11/08/2011  1.  Mild edema and small effusions.  Unchanged.  Original Report Authenticated By: Rosealee Albee, M.D.    Assessment/Plan:  1. Acute respiratory failure (11/04/2011), COPD Assessment: the patient appears very comfortable on low-flow oxygen with adequate saturations, and there is  no evidence for bronchospasm on exam.    Plan: continue supplemental oxygen as needed.  2. HYPERTENSION (11/30/2007) Assessment: the patient has had periodic elevated BP's, but in an acceptable range.  Now, WNL.  ?accuracy of readings. We'll continue to monitor for now.  3. Acute renal failure (11/04/2011) Lab Results  Component Value Date   CREATININE 1.01 11/08/2011   CREATININE 1.33* 11/07/2011   CREATININE 2.42* 11/06/2011    Assessment: the patient's renal function is much improved.  A renal ultrasound is pending to rule out obstruction, but she is clearly better today.  She is not getting IV fluids currently, but may have to reevaluate that if she continues to auto diurese spontaneously.    Plan: monitor BUN and creatinine.  May have to add back IV fluids if renal function worsens and she continues to auto diurese.  Followup renal ultrasound.  Continue to hold ACE inhibitors.  4. CAP (community acquired pneumonia) (11/05/2011) Assessment: the patient is afebrile on her current antibiotics, and is not coughing up purulent mucus currently.  Her right upper lobe on chest x-ray today appears improved.  Plan: Continue to monitor chest x-ray as needed, no change in antibiotics currently  5. Disposition:   Assessment:  Likely needs short term rehab Plan: will attempt to place at [facility] for short term rehab.  Case Mgmt & SW working on process.  Likely will be Monday for d/c.         Sandrea Hughs, MD Pulmonary and Critical Care Medicine [facility] Cell [phone]

## 2011-11-09 DIAGNOSIS — J9600 Acute respiratory failure, unspecified whether with hypoxia or hypercapnia: Secondary | ICD-10-CM

## 2011-11-09 DIAGNOSIS — E875 Hyperkalemia: Secondary | ICD-10-CM

## 2011-11-09 DIAGNOSIS — N179 Acute kidney failure, unspecified: Secondary | ICD-10-CM

## 2011-11-09 LAB — PROTIME-INR: INR: 1.24 (ref 0.00–1.49)

## 2011-11-09 MED ORDER — VERAPAMIL HCL ER 180 MG PO TBCR
180.0000 mg | EXTENDED_RELEASE_TABLET | Freq: Two times a day (BID) | ORAL | Status: DC
  Administered 2011-11-09 – 2011-11-10 (×3): 180 mg via ORAL
  Filled 2011-11-09 (×4): qty 1

## 2011-11-09 MED ORDER — ZOLPIDEM TARTRATE 5 MG PO TABS
5.0000 mg | ORAL_TABLET | Freq: Every evening | ORAL | Status: DC | PRN
  Administered 2011-11-09: 5 mg via ORAL
  Filled 2011-11-09: qty 1

## 2011-11-09 MED ORDER — WARFARIN SODIUM 7.5 MG PO TABS
7.5000 mg | ORAL_TABLET | Freq: Once | ORAL | Status: AC
  Administered 2011-11-09: 7.5 mg via ORAL
  Filled 2011-11-09 (×2): qty 1

## 2011-11-09 MED ORDER — POTASSIUM CHLORIDE CRYS ER 20 MEQ PO TBCR
EXTENDED_RELEASE_TABLET | ORAL | Status: AC
  Administered 2011-11-09: 13:00:00
  Filled 2011-11-09: qty 1

## 2011-11-09 MED ORDER — ZOLPIDEM TARTRATE 5 MG PO TABS: 10.0000 mg | ORAL_TABLET | Freq: Every evening | ORAL | Status: DC | PRN

## 2011-11-09 NOTE — Progress Notes (Signed)
Hx: 77 yo woman, hx COPD, A Fib. Admitted with altered MS, renal failure, resp failure.  Subjective: Sitting in chair on nasal 02 nad wants to go home. No cp or cough or sob. Foley noted.  Objective: Vital signs in last 24 hours: Blood pressure 131/69, pulse 83, temperature 98 F (36.7 C), temperature source Oral, resp. rate 18, height 5\' 3"  (1.6 m), weight 191 lb (86.637 kg), SpO2 94.00%.  Intake/Output from previous day: 11/24 0701 - 11/25 0700 In: 600 [P.O.:600] Out: 1925 [Urine:1925]   Physical Exam:  well-developed elderly female in no acute distress Resp's even/non-labored,lungs bilaterally diminished but clear S1S2 rrr, no m/r/g Abdomen soft and nontender, good bowel sounds Lower extremities with mild edema, no cyanosis noted Alert and oriented, mild situational forgetfulness, moves all 4 extremities.  Gu: foley with amber urine  Lab Results:  Basename 11/08/11 0630 11/07/11 0640  WBC 7.7 7.7  HGB 10.9* 10.1*  HCT 33.6* 31.5*  PLT 153 136*   BMET  Basename 11/08/11 0630 11/07/11 0640  NA 138 141  K 3.4* 3.4*  CL 95* 96  CO2 35* 39*  GLUCOSE 110* 84  BUN 17 26*  CREATININE 1.01 1.33*  CALCIUM 8.5 8.5    Studies/Results: Dg Chest 2 View  11/08/2011  1.  Mild edema and small effusions.  Unchanged.  O     Assessment/Plan:  1. Acute respiratory failure (11/04/2011), COPD Assessment: the patient appears very comfortable on low-flow oxygen with adequate saturations, and there is  no evidence for bronchospasm on exam.    Plan: continue supplemental oxygen as needed.  2. HYPERTENSION (11/30/2007) Assessment: the patient has had periodic elevated BP's, but in an acceptable range.  Now, WNL.  ?accuracy of readings. We'll continue to monitor for now.  3. Acute renal failure (11/04/2011) Lab Results  Component Value Date   CREATININE 1.01 11/08/2011   CREATININE 1.33* 11/07/2011   CREATININE 2.42* 11/06/2011    Resolved >   Continue to hold ACE  inhibitors.  4. CAP (community acquired pneumonia) (11/05/2011) Fortaz/ vanc Assessment: the patient is afebrile on her current antibiotics, and is not coughing up purulent mucus currently.  Her right upper lobe on chest x-ray today appears improved.  Plan: Continue to monitor chest x-ray as needed, no change in antibiotics currently  5. Disposition:   Assessment:  Likely needs short term rehab Plan: will attempt to place at [facility] for short term rehab.  Case Mgmt & SW working on process.  Likely will be Monday for d/c.         Sandrea Hughs, MD Pulmonary and Critical Care Medicine [facility] Cell [phone]

## 2011-11-09 NOTE — Progress Notes (Signed)
eLink Physician-Brief Progress Note Patient Name: [name] DOB: February 15, 1922 MRN: [id number]  Date of Service  11/09/2011   HPI/Events of Note  Htn, no longer brady   eICU Interventions  Add home verap   Intervention Category Minor Interventions: Routine modifications to care plan (e.g. PRN medications for pain, fever);Agitation / anxiety - evaluation and management  FEINSTEIN,DANIEL J. 11/09/2011, 6:16 AM

## 2011-11-09 NOTE — Progress Notes (Signed)
PC to Dr. Tyson Alias. Notified of elevated BP's throughout shift and pt request for sleep medication.  New order given.

## 2011-11-09 NOTE — Progress Notes (Addendum)
Anticoagulation & Antibiotics CONSULT NOTE - FOLLOW UP   Pharmacy Consult for Warfarin Indication: Atrial Fibrillation   Allergies  Allergen Reactions  . Codeine     REACTION: nausea    Patient Measurements: Height: 5\' 3"  (160 cm) Weight: 191 lb (86.637 kg) IBW/kg (Calculated) : 52.4   Vital Signs: Temp: 98.4 F (36.9 C) (11/25 1400) Temp src: Oral (11/25 1400) BP: 133/76 mmHg (11/25 1400) Pulse Rate: 80  (11/25 1400) Intake/Output from previous day: 11/24 0701 - 11/25 0700 In: 600 [P.O.:600] Out: 1925 [Urine:1925] Intake/Output from this shift: Total I/O In: 450 [P.O.:360; Other:90] Out: 300 [Urine:300]  Labs:  [facility] 11/08/11 0630 11/07/11 0640  WBC 7.7 7.7  HGB 10.9* 10.1*  HCT 33.6* 31.5*  PLT 153 136*  APTT -- --  CREATININE 1.01 1.33*  LABCREA -- --  CREATININE 1.01 1.33*  CREAT24HRUR -- --  MG -- --  PHOS -- --  ALBUMIN -- --  PROT -- --  ALBUMIN -- --  AST -- --  ALT -- --  ALKPHOS -- --  BILITOT -- --  BILIDIR -- --  IBILI -- --   Estimated Creatinine Clearance: 40.9 ml/min (by C-G formula based on Cr of 1.01).   Microbiology: Recent Results (from the past 720 hour(s))  URINE CULTURE     Status: Normal   Collection Time   11/04/11  2:24 PM      Component Value Range Status Comment   Specimen Description URINE, CATHETERIZED   Final    Special Requests NONE   Final    Setup Time [id number]   Final    Colony Count NO GROWTH   Final    Culture NO GROWTH   Final    Report Status 11/05/2011 FINAL   Final   CULTURE, BLOOD (ROUTINE X 2)     Status: Normal (Preliminary result)   Collection Time   11/04/11  4:44 PM      Component Value Range Status Comment   Specimen Description BLOOD ARM RIGHT   Final    Special Requests BOTTLES DRAWN AEROBIC AND ANAEROBIC 10CC   Final    Setup Time [id number]   Final    Culture     Final    Value:        BLOOD CULTURE RECEIVED NO GROWTH TO DATE CULTURE WILL BE HELD FOR 5 DAYS BEFORE ISSUING A FINAL  NEGATIVE REPORT   Report Status PENDING   Incomplete   CULTURE, BLOOD (ROUTINE X 2)     Status: Normal (Preliminary result)   Collection Time   11/04/11  4:51 PM      Component Value Range Status Comment   Specimen Description BLOOD HAND RIGHT   Final    Special Requests BOTTLES DRAWN AEROBIC ONLY 7CC   Final    Setup Time [id number]   Final    Culture     Final    Value:        BLOOD CULTURE RECEIVED NO GROWTH TO DATE CULTURE WILL BE HELD FOR 5 DAYS BEFORE ISSUING A FINAL NEGATIVE REPORT   Report Status PENDING   Incomplete   MRSA PCR SCREENING     Status: Normal   Collection Time   11/04/11  6:46 PM      Component Value Range Status Comment   MRSA by PCR NEGATIVE  NEGATIVE  Final     Medications:  Prescriptions prior to admission  Medication Sig Dispense Refill  . albuterol (PROAIR HFA) 108 (90 BASE)  MCG/ACT inhaler Inhale 2 puffs into the lungs every 4 (four) hours as needed. For shortness of breath      . ALPRAZolam (XANAX) 0.25 MG tablet Take 0.25 mg by mouth at bedtime as needed. For anxiety      . atorvastatin (LIPITOR) 20 MG tablet Take 20 mg by mouth daily.       Marland Kitchen CALCIUM PO Take 1 tablet by mouth 2 (two) times daily.        . Cholecalciferol (VITAMIN D PO) Take 1 tablet by mouth daily.        . citalopram (CELEXA) 20 MG tablet Take 60 mg by mouth daily.       . digoxin (LANOXIN) 0.125 MG tablet Take 1 tablet by mouth Daily.      Marland Kitchen donepezil (ARICEPT) 10 MG tablet Take 10 mg by mouth at bedtime.       . furosemide (LASIX) 40 MG tablet Take 40 mg by mouth daily.        Marland Kitchen ibuprofen (ADVIL,MOTRIN) 200 MG tablet Take 200 mg by mouth every 8 (eight) hours as needed. For pain      . IRON PO Take 1 tablet by mouth 2 (two) times daily.        Marland Kitchen KLOR-CON M20 20 MEQ tablet Take 20 mEq by mouth Twice daily.       Marland Kitchen levothyroxine (SYNTHROID, LEVOTHROID) 100 MCG tablet Take 100 mcg by mouth daily.        Marland Kitchen lisinopril (PRINIVIL,ZESTRIL) 40 MG tablet Take 1 tablet (40 mg total) by  mouth daily.  30 tablet  11  . tiotropium (SPIRIVA) 18 MCG inhalation capsule Place 18 mcg into inhaler and inhale daily.        . verapamil (CALAN-SR) 180 MG CR tablet Take 180 mg by mouth 2 (two) times daily.       Marland Kitchen warfarin (COUMADIN) 5 MG tablet Take 5-7.5 mg by mouth daily. Take 7.5mg  on Sunday and 5mg  all other days      . zolpidem (AMBIEN) 10 MG tablet Take 10 mg by mouth at bedtime as needed. For sleep       Scheduled:     . ipratropium  0.5 mg Nebulization Q6H   And  . albuterol  2.5 mg Nebulization Q6H  . antiseptic oral rinse  15 mL Mouth Rinse BID  . cefTAZidime (FORTAZ) IV  1 g Intravenous Q24H  . citalopram  20 mg Oral Daily  . donepezil  10 mg Oral QHS  . levothyroxine  100 mcg Oral Daily  . pantoprazole  40 mg Oral Q1200  . potassium chloride SA      . simvastatin  20 mg Oral q1800  . vancomycin  1,000 mg Intravenous Q24H  . verapamil  180 mg Oral BID  . warfarin  5 mg Oral ONCE-1800  . warfarin  7.5 mg Oral ONCE-1800    Assessment: Patient Active Problem List  Diagnoses  . ADENOCARCINOMA, BREAST, RIGHT  . HYPERLIPIDEMIA  . HYPERTENSION  . C O P D  . Atrial fib/flutter  . Peripheral edema  . Abnormal chest CT  . Bradycardia  . Acute renal failure  . Acute respiratory failure  . CAP (community acquired pneumonia)   77 yo F admitted 11/20 after being found down.   Pharmacy Problem list: Atrial Fibrillation: warfarin, INR supertherapeutic on admission now < goal. INR 1.24. Pt has not received coumadin that was ordered on 11/22 &23???  Will continue with home dose and  give 7.5 mg today CAP: D#6 Vancomycin, Fortaz, afeb, WBC declining, CrCl continues to improve (Scr down to 1.01), Blood x 2 and urine from 11/20 no growth to date. Respiratory culture ordered, not drawn.   Anxiety,Depression,Memory Loss: Citalopram, donepezil Cardiology: Hyperlipidemia: Simvastatin Elevated BNP, Last ECHO 07/14/11 EF 50-55% ACEI not clearly indicated, also contraindicated  currently given acute renal failure on admission. Hypothyroid: Levothroxine Best Practices/DVT Prophylaxsis: Protonix, INR therapeutic  Goal of Therapy:  INR goal 2-3   Plan:  1. Warfarin 7.5 mg at 1800 today.  Eugene Garnet 11/09/2011,3:56 PM   Coumadin doses for 11/22,23&24 WERE charted as given. Wondering if pt is actually swallowing the doses since the INR continues to decline. Nurse will check when he gives dose tonight to see if she is swallowing.

## 2011-11-10 LAB — PROTIME-INR: Prothrombin Time: 16.6 seconds — ABNORMAL HIGH (ref 11.6–15.2)

## 2011-11-10 LAB — CULTURE, BLOOD (ROUTINE X 2)
Culture  Setup Time: 201211202250
Culture: NO GROWTH

## 2011-11-10 MED ORDER — WARFARIN SODIUM 7.5 MG PO TABS
7.5000 mg | ORAL_TABLET | Freq: Once | ORAL | Status: DC
  Filled 2011-11-10: qty 1

## 2011-11-10 MED ORDER — LISINOPRIL 20 MG PO TABS
20.0000 mg | ORAL_TABLET | Freq: Every day | ORAL | Status: DC
  Filled 2011-11-10: qty 1

## 2011-11-10 MED ORDER — WARFARIN SODIUM 5 MG PO TABS: 5.0000 mg | ORAL_TABLET | Freq: Every day | ORAL | Status: DC

## 2011-11-10 MED ORDER — AMLODIPINE BESYLATE 10 MG PO TABS: 10.0000 mg | ORAL_TABLET | Freq: Every day | ORAL | Status: DC

## 2011-11-10 MED ORDER — AMLODIPINE BESYLATE 10 MG PO TABS
10.0000 mg | ORAL_TABLET | Freq: Every day | ORAL | Status: DC
  Administered 2011-11-10: 10 mg via ORAL
  Filled 2011-11-10: qty 1

## 2011-11-10 MED ORDER — LEVOFLOXACIN 500 MG PO TABS
500.0000 mg | ORAL_TABLET | Freq: Every day | ORAL | Status: DC
  Administered 2011-11-10: 500 mg via ORAL
  Filled 2011-11-10: qty 1

## 2011-11-10 MED ORDER — LEVOFLOXACIN 500 MG PO TABS: 500.0000 mg | ORAL_TABLET | Freq: Every day | ORAL | Status: AC

## 2011-11-10 MED ORDER — POTASSIUM CHLORIDE CRYS ER 20 MEQ PO TBCR
40.0000 meq | EXTENDED_RELEASE_TABLET | Freq: Once | ORAL | Status: AC
  Administered 2011-11-10: 40 meq via ORAL
  Filled 2011-11-10: qty 2

## 2011-11-10 NOTE — Progress Notes (Addendum)
Patient ID: [name], female   DOB: 01-21-1922, 77 y.o.   MRN: [id number] Hx: 77 yo woman, hx COPD, A Fib. Admitted with altered MS, renal failure, resp failure.  Subjective:  No issues overnight. Awaiting bed at [facility] Objective: Vital signs in last 24 hours: Blood pressure 160/71, pulse 66, temperature 97.9 F (36.6 C), temperature source Oral, resp. rate 18, height 5\' 4"  (1.626 m), weight 87.4 kg (192 lb 10.9 oz), SpO2 94.00%.  Intake/Output from previous day: 11/25 0701 - 11/26 0700 In: 1310 [P.O.:720; IV Piggyback:500] Out: 950 [Urine:950]   Physical Exam:  well-developed elderly female in no acute distress Resp's even/non-labored,lungs bilaterally diminished but clear S1S2 rrr, no m/r/g Abdomen soft and nontender, good bowel sounds Lower extremities with mild edema, no cyanosis noted Alert and oriented, mild situational forgetfulness, moves all 4 extremities.  Gu: foley with amber urine  Lab Results:  Basename 11/08/11 0630  WBC 7.7  HGB 10.9*  HCT 33.6*  PLT 153   BMET  Basename 11/08/11 0630  NA 138  K 3.4*  CL 95*  CO2 35*  GLUCOSE 110*  BUN 17  CREATININE 1.01  CALCIUM 8.5    Studies/Results: Dg Chest 2 View  11/08/2011  1.  Mild edema and small effusions.  Unchanged.  O     Assessment/Plan:  1. Acute respiratory failure (11/04/2011), COPD Assessment: the patient appears very comfortable on low-flow oxygen with adequate saturations, and there is  no evidence for bronchospasm on exam.    Plan: continue supplemental oxygen as needed.  2. HYPERTENSION (11/30/2007) Assessment: the patient has had periodic elevated BP's, but in an acceptable range.  Note EF 55%  Therefore I would not resume digoxin. BP high Will start norvasc 10mg  daily.  Do NOT plan to resume ACE inhibitor d/t renal failure issues.   3. Acute renal failure (11/04/2011) Lab Results  Component Value Date   CREATININE 1.01 11/08/2011   CREATININE 1.33* 11/07/2011   CREATININE 2.42* 11/06/2011    Resolved >   Continue to hold ACE inhibitors.  4. CAP (community acquired pneumonia) (11/05/2011) Fortaz/ vanc Assessment: the patient is afebrile on her current antibiotics, and is not coughing up purulent mucus currently.  Her right upper lobe on chest x-ray today appears improved.  Plan: d/c fortaz and vanco. PO levaquin x 4 days and d/c 500mg  /d  5. Hypokalemia Replete K 5. Disposition:   Assessment:  Likely needs short term rehab Plan: will attempt to place at [facility] for short term rehab.  Case Mgmt & SW working on process.  Likely will be Monday for d/c. >>>confirm bed availability this PM        Cheri Rous PCCM Service   Beeper  [phone]  Cell  [phone]

## 2011-11-10 NOTE — Progress Notes (Signed)
Patient information given to ambulance staff. Called nursing facility re: low BP - updated on meds received this a.m and that patients SBP normally in 150s - 180s. Condition stable upon departure.

## 2011-11-10 NOTE — Progress Notes (Signed)
Patient is being discharged from PT/ OT/ SLP services secondary to:  Pt discharged from hospital.    Theron Arista L. Zarina Pe DPT [phone] 11/10/2011

## 2011-11-10 NOTE — Progress Notes (Signed)
   CARE MANAGEMENT NOTE 11/10/2011  Patient:  [name], [name]   Account Number:  [id number]  Date Initiated:  11/05/2011  Documentation initiated by:  [facility]  Subjective/Objective Assessment:   Found down floor - undetermined time - resp distress. Lives at home alone.     Action/Plan:   Anticipated DC Date:  11/11/2011   Anticipated DC Plan:  SKILLED NURSING FACILITY  In-house referral  Clinical Social Worker      DC Planning Services  CM consult      Choice offered to / List presented to:             Status of service:  Completed, signed off Medicare Important Message given?   (If response is "NO", the following Medicare IM given date fields will be blank) Date Medicare IM given:   Date Additional Medicare IM given:    Discharge Disposition:  SKILLED NURSING FACILITY  Per UR Regulation:  Reviewed for med. necessity/level of care/duration of stay  Comments:  11/10/11 Emmakate Hypes,RN,BSN 1400 PT DISCHARGED TO [facility] TODAY, PER CSW ARRANGEMENTS. Phone #[phone]  11/05/11 Tinslee Klare,RN,BSN 1540 NOTIFIED BY MD THAT PT WILL NEED SKILLED NURSING FACILITY PLACEMENT.  CSW INFORMED OF NEW CONSULT.  CSW TO FACILITATE DC TO SNF WHEN MEDICALLY STABLE FOR DISCHARGE.  11-05-11 1:30pm Avie Arenas, RNBSN [phone] UR Completed.

## 2011-11-10 NOTE — Progress Notes (Signed)
ANTICOAGULATION CONSULT NOTE - Follow Up Consult  Pharmacy Consult for coumadin Indication: atrial fibrillation  Allergies  Allergen Reactions  . Codeine     REACTION: nausea    Patient Measurements: Height: 5\' 4"  (162.6 cm) Weight: 192 lb 10.9 oz (87.4 kg) IBW/kg (Calculated) : 54.7  Adjusted Body Weight:   Vital Signs: Temp: 97.9 F (36.6 C) (11/26 0615) Temp src: Oral (11/26 0615) BP: 160/71 mmHg (11/26 0615) Pulse Rate: 66  (11/26 0615)  Labs:  Basename 11/10/11 0644 11/09/11 0630 11/08/11 0630  HGB -- -- 10.9*  HCT -- -- 33.6*  PLT -- -- 153  APTT -- -- --  LABPROT 16.6* 15.9* --  INR 1.32 1.24 --  HEPARINUNFRC -- -- --  CREATININE -- -- 1.01  CKTOTAL -- -- --  CKMB -- -- --  TROPONINI -- -- --   Estimated Creatinine Clearance: 42 ml/min (by C-G formula based on Cr of 1.01).    Assessment: Patient is an 77 y.o F on coumadin for afib.  INR now trending up after dose increased to 7.5mg  last night.  With Abx changed to Levaquin today, will monitor INR closely for potential drug-drug interactions with coumadin.  Goal of Therapy:  INR 2-3   Plan:  1) Repeat coumadin 7.5mg  PO x1 today  Jensyn Cambria P 11/10/2011,8:35 AM

## 2011-11-10 NOTE — Progress Notes (Signed)
Clinical Social Worker assisted with the transfer of pt to SNF.  CSW arranged transport.  CSW signing off at dc.    Angelia Mould, MSW, [place] [phone]

## 2011-11-10 NOTE — Discharge Summary (Signed)
Physician Discharge Summary  Patient ID: [name] MRN: [id number] DOB/AGE: 21-Jun-1922 77 y.o.  Admit date: 11/04/2011 Discharge date: 11/10/2011    Discharge Diagnoses:  Principal Problem:  *Acute respiratory failure Active Problems:  Bradycardia  Acute renal failure  HYPERTENSION  CAP (community acquired pneumonia)    Brief Summary: [name] is a 77 y.o. y/o female with a PMH of COPD home O2 dep, A-Fib on coumadin admitted on 11/04/2011 for Dehydration,Bradycardia, Acute renal failure, respiratory failure with hypoxia.  She was placed on Bipap initially, aggressively volume resuscitated and renal offending agents were held.  She made significant improvement in respiratory status in first 24 hours.  She was treated for RUL community acquired PNA.  Renal function was slow to improve but is now within normal limits.  Anti-hypertensive regimen was adjusted in setting of acute renal failure.   She was transitioned to PO antibiotics at time of discharge.    Hospital Course by Discharge Summary 1. Acute respiratory failure secondary to COPD and RUL PNA. Assessment: the patient appears very comfortable on low-flow oxygen with adequate saturations, and there is  no evidence for bronchospasm on exam. Plan: continue supplemental oxygen -is on 3L at home.   2. HYPERTENSION  Assessment: the patient has had periodic elevated BP's.  Note EF 55% Therefore I would not resume digoxin.  Will start norvasc 10mg  daily. Do NOT plan to resume ACE inhibitor d/t renal failure issues.   3. Acute renal failure (11/04/2011)  Lab Results   Component  Value  Date    CREATININE  1.01  11/08/2011    CREATININE  1.33*  11/07/2011    CREATININE  2.42*  11/06/2011    Resolved > Continue to hold ACE inhibitors.  4. CAP (community acquired pneumonia) (11/05/2011)  Assessment: the patient is afebrile on her current antibiotics, and is not coughing up purulent mucus currently. Her right upper lobe on  chest x-ray today appears improved.  Plan:  PO levaquin x 4 more days  5. Hypokalemia - transient & replete PRN.  Replete K   6. Disposition:  Assessment: Likely needs short term rehab  Plan: Planned d/c to [facility] for short term rehab.   Microbiology/Sepsis markers: 11/20 BCx2>>>neg 11/20 UC>>>neg 11/20 MRSA PCR>>>neg  Labs: BMET    Component Value Date/Time   NA 138 11/08/2011 0630   K 3.4* 11/08/2011 0630   CL 95* 11/08/2011 0630   CO2 35* 11/08/2011 0630   GLUCOSE 110* 11/08/2011 0630   BUN 17 11/08/2011 0630   CREATININE 1.01 11/08/2011 0630   CALCIUM 8.5 11/08/2011 0630   GFRNONAA 49* 11/08/2011 0630   GFRAA 56* 11/08/2011 0630    CBC    Component Value Date/Time   WBC 7.7 11/08/2011 0630   RBC 3.57* 11/08/2011 0630   HGB 10.9* 11/08/2011 0630   HCT 33.6* 11/08/2011 0630   PLT 153 11/08/2011 0630   MCV 94.1 11/08/2011 0630   MCH 30.5 11/08/2011 0630   MCHC 32.4 11/08/2011 0630   RDW 15.7* 11/08/2011 0630   LYMPHSABS 0.8 11/04/2011 1340   MONOABS 1.9* 11/04/2011 1340   EOSABS 0.0 11/04/2011 1340   BASOSABS 0.0 11/04/2011 1340    INR/Prothrombin Time Trend  Results for orders placed during the hospital encounter of 11/04/11 (from the past 24 hour(s))  PROTIME-INR     Status: Abnormal   Collection Time   11/10/11  6:44 AM      Component Value Range   Prothrombin Time 16.6 (*)  11.6 - 15.2 (seconds)   INR 1.32  0.00 - 1.49     Collection Time   11/09/11  6:30 AM      Component Value Range Comment   Prothrombin Time 15.9 (*) 11.6 - 15.2 (seconds)    INR 1.24  0.00 - 1.49       Discharge Exam: well-developed elderly female in no acute distress  Resp's even/non-labored,lungs bilaterally diminished but clear  S1S2 rrr, no m/r/g  Abdomen soft and nontender, good bowel sounds  Lower extremities with mild edema, no cyanosis noted  Alert and oriented, mild situational forgetfulness, moves all 4 extremities.  Gu: foley with amber  urine     Disposition: SNF for short term rehab.  Discharge Orders    Future Appointments: Provider: Department: Dept Phone: Center:   11/19/2011 3:00 PM Shan Levans, MD [facility] [phone] None     Medication List  As of 11/10/2011 11:09 AM   START taking these medications         amLODipine 10 MG tablet   Commonly known as: NORVASC   Take 1 tablet (10 mg total) by mouth daily.      levofloxacin 500 MG tablet   Commonly known as: LEVAQUIN   Take 1 tablet (500 mg total) by mouth daily.         CHANGE how you take these medications         warfarin 5 MG tablet   Commonly known as: COUMADIN   Take 1-1.5 tablets (5-7.5 mg total) by mouth daily. Pt will need 7.5 mg on 11/27 and then resume  7.5mg  on Sunday and 5mg  all other days   What changed: doctor's instructions         CONTINUE taking these medications         ALPRAZolam 0.25 MG tablet   Commonly known as: XANAX      AMBIEN 10 MG tablet   Generic drug: zolpidem      atorvastatin 20 MG tablet   Commonly known as: LIPITOR      CALCIUM PO      citalopram 20 MG tablet   Commonly known as: CELEXA      donepezil 10 MG tablet   Commonly known as: ARICEPT      furosemide 40 MG tablet   Commonly known as: LASIX      ibuprofen 200 MG tablet   Commonly known as: ADVIL,MOTRIN      IRON PO      KLOR-CON M20 20 MEQ tablet   Generic drug: potassium chloride SA      levothyroxine 100 MCG tablet   Commonly known as: SYNTHROID, LEVOTHROID      PROAIR HFA 108 (90 BASE) MCG/ACT inhaler   Generic drug: albuterol      tiotropium 18 MCG inhalation capsule   Commonly known as: SPIRIVA      verapamil 180 MG CR tablet   Commonly known as: CALAN-SR      VITAMIN D PO         STOP taking these medications         digoxin 0.125 MG tablet      lisinopril 40 MG tablet          Where to get your medications    These are the prescriptions that you need to pick up.   You may get these medications  from any pharmacy.         levofloxacin 500 MG tablet   warfarin 5  MG tablet         Information on where to get these meds is not yet available. Ask your nurse or doctor.         amLODipine 10 MG tablet             Follow-up Information    Follow up with Shan Levans, MD on 11/19/2011. (@ 3pm)    Contact information:   [address] [facility] [address] [place] [address] [place] [address] [phone]         Discharged Condition: MAUREENA DABBS has met maximum benefit of inpatient care and is medically stable and cleared for discharge.  Patient is pending transfer to [facility].  Further follow up per [facility].    WILL NEED PT/INR AM of 11/28 with follow-up coumadin adjustment.  Time spent on disposition: greater than 35 minutes  Signed: Canary Brim, NP-C [facility] Pgr: [phone]    I have reviewed the D/C note and agree with the plan of care as outlined per [facility] NP.  Cheri Rous PCCM Service   Beeper  [phone]  Cell  [phone]

## 2011-11-19 ENCOUNTER — Ambulatory Visit (INDEPENDENT_AMBULATORY_CARE_PROVIDER_SITE_OTHER): Payer: Medicare Other | Admitting: Critical Care Medicine

## 2011-11-19 ENCOUNTER — Encounter: Payer: Self-pay | Admitting: Critical Care Medicine

## 2011-11-19 ENCOUNTER — Other Ambulatory Visit (INDEPENDENT_AMBULATORY_CARE_PROVIDER_SITE_OTHER): Payer: Medicare Other

## 2011-11-19 VITALS — BP 94/62 | HR 45 | Temp 98.0°F

## 2011-11-19 DIAGNOSIS — J189 Pneumonia, unspecified organism: Secondary | ICD-10-CM

## 2011-11-19 DIAGNOSIS — I4891 Unspecified atrial fibrillation: Secondary | ICD-10-CM

## 2011-11-19 DIAGNOSIS — J9600 Acute respiratory failure, unspecified whether with hypoxia or hypercapnia: Secondary | ICD-10-CM

## 2011-11-19 DIAGNOSIS — J449 Chronic obstructive pulmonary disease, unspecified: Secondary | ICD-10-CM

## 2011-11-19 DIAGNOSIS — J4489 Other specified chronic obstructive pulmonary disease: Secondary | ICD-10-CM

## 2011-11-19 LAB — CBC WITH DIFFERENTIAL/PLATELET
Basophils Absolute: 0.1 10*3/uL (ref 0.0–0.1)
Basophils Relative: 1.1 % (ref 0.0–3.0)
Eosinophils Absolute: 0.2 10*3/uL (ref 0.0–0.7)
Lymphocytes Relative: 14.6 % (ref 12.0–46.0)
MCHC: 33.8 g/dL (ref 30.0–36.0)
Neutrophils Relative %: 71.3 % (ref 43.0–77.0)
Platelets: 237 10*3/uL (ref 150.0–400.0)
RBC: 3.35 Mil/uL — ABNORMAL LOW (ref 3.87–5.11)
RDW: 15.6 % — ABNORMAL HIGH (ref 11.5–14.6)

## 2011-11-19 LAB — PROTIME-INR
INR: 1.6 ratio — ABNORMAL HIGH (ref 0.8–1.0)
Prothrombin Time: 17.1 s — ABNORMAL HIGH (ref 10.2–12.4)

## 2011-11-19 LAB — BASIC METABOLIC PANEL
CO2: 34 mEq/L — ABNORMAL HIGH (ref 19–32)
Calcium: 9.4 mg/dL (ref 8.4–10.5)
Creatinine, Ser: 1.5 mg/dL — ABNORMAL HIGH (ref 0.4–1.2)

## 2011-11-19 MED ORDER — TIOTROPIUM BROMIDE MONOHYDRATE 18 MCG IN CAPS: 18.0000 ug | ORAL_CAPSULE | Freq: Every day | RESPIRATORY_TRACT | Status: AC

## 2011-11-19 MED ORDER — AMLODIPINE BESYLATE 10 MG PO TABS: 5.0000 mg | ORAL_TABLET | Freq: Every day | ORAL | Status: DC

## 2011-11-19 NOTE — Progress Notes (Signed)
Quick Note:  Called and spoke with Harriett Sine, pt's nurse at [facility] and notified to have pt stop the K and have MD review coumadin use as INR too low. Harriett Sine verbalized understanding and states will have Dr. Chilton Si review labs. I am faxing them to her attn at [phone]. ______

## 2011-11-19 NOTE — Progress Notes (Signed)
Subjective:    Patient ID: [name], female    DOB: Jul 24, 1922, 77 y.o.   MRN: [id number]  HPI  This is an 77 y.o. white female with chronic obstructive lung disease. Primary emphysematous component.   10/12 At last ov we increased O2 to 3L rest and we gave pulse prednisone.  No more wheezing since took pred No cough, no congestion or mucus.  No real chest pain.  No edema in feet.  Since the last office visit the patient has been to the emergency room on September 25. At this visit digoxin was added. The patient was in atrial flutter with rapid ventricular response. Previous to this emergency room visit the patient had verapamil dosage increased and lisinopril dosage decreased on 08/29/2011 cardiology office visit.   12/5 Pt in hospital in 11/12.   Notes more edema in LE.  No real cough.  No chest pain.  Dyspnea ok.  Is able to walk some .  Not able to walk at all at first. Able to walk 32ft.  PT at same facility is grandson. Pt denies any significant sore throat, nasal congestion or excess secretions, fever, chills, sweats, unintended weight loss, pleurtic or exertional chest pain, orthopnea PND, or leg swelling Pt denies any increase in rescue therapy over baseline, denies waking up needing it or having any early am or nocturnal exacerbations of coughing/wheezing/or dyspnea. Pt also denies any obvious fluctuation in symptoms with  weather or environmental change or other alleviating or aggravating factors    Past Medical History  Diagnosis Date  . COPD (chronic obstructive pulmonary disease)   . Hyperlipidemia   . Hypertension   . Osteoporosis   . Hypothyroid   . Memory loss   . Anemia     Presumed GI on iron replacement hemoglobin in the 10 range  . Atrial flutter     Coumadin initiated 7/12 and followed by PCP; patient felt to be high risk for RFCA secondary to COPD;  echo was done 7/30:  EF 50% to 55%. Wall motion was normal ;there were no regional wall motion abnormalities,  mild AS, MAC, mild LAE, mild RAE, PASP 35, mild pulmonary HTN  . On home O2   . Anxiety   . Depression   . CHF (congestive heart failure)   . Emphysema   . Chronic kidney disease   . Breast cancer   . DEMENTIA 11/04/11    "she has some significant dementia"     Family History  Problem Relation Age of Onset  . Emphysema    . Heart attack    . Diabetes    . Hypertension    . Cancer    . Anxiety disorder    . Depression    . Coronary artery disease       History   Social History  . Marital Status: Married    Spouse Name: N/A    Number of Children: N/A  . Years of Education: N/A   Occupational History  . Not on file.   Social History Main Topics  . Smoking status: Never Smoker   . Smokeless tobacco: Never Used  . Alcohol Use: No  . Drug Use: No  . Sexually Active: No   Other Topics Concern  . Not on file   Social History Narrative  . No narrative on file     Allergies  Allergen Reactions  . Codeine     REACTION: nausea     Outpatient Prescriptions Prior to Visit  Medication Sig Dispense Refill  . albuterol (PROAIR HFA) 108 (90 BASE) MCG/ACT inhaler Inhale 2 puffs into the lungs every 4 (four) hours as needed. For shortness of breath      . ALPRAZolam (XANAX) 0.25 MG tablet Take 0.25 mg by mouth at bedtime as needed. For anxiety      . atorvastatin (LIPITOR) 20 MG tablet Take 20 mg by mouth daily.       . citalopram (CELEXA) 20 MG tablet Take 60 mg by mouth daily.       Marland Kitchen donepezil (ARICEPT) 10 MG tablet Take 10 mg by mouth at bedtime.       . furosemide (LASIX) 40 MG tablet Take 40 mg by mouth daily.        Marland Kitchen ibuprofen (ADVIL,MOTRIN) 200 MG tablet Take 200 mg by mouth every 8 (eight) hours as needed. For pain      . KLOR-CON M20 20 MEQ tablet Take 20 mEq by mouth daily.       Marland Kitchen levofloxacin (LEVAQUIN) 500 MG tablet Take 1 tablet (500 mg total) by mouth daily.  4 tablet  0  . levothyroxine (SYNTHROID, LEVOTHROID) 100 MCG tablet Take 100 mcg by mouth daily.         . verapamil (CALAN-SR) 180 MG CR tablet Take 180 mg by mouth 2 (two) times daily.       Marland Kitchen zolpidem (AMBIEN) 10 MG tablet Take 10 mg by mouth at bedtime as needed. For sleep      . amLODipine (NORVASC) 10 MG tablet Take 1 tablet (10 mg total) by mouth daily.      Marland Kitchen warfarin (COUMADIN) 5 MG tablet Take 1-1.5 tablets (5-7.5 mg total) by mouth daily. Pt will need 7.5 mg on 11/27 and then resume  7.5mg  on Sunday and 5mg  all other days  60 tablet  0  . CALCIUM PO Take 1 tablet by mouth 2 (two) times daily.        . Cholecalciferol (VITAMIN D PO) Take 1 tablet by mouth daily.        . IRON PO Take 1 tablet by mouth 2 (two) times daily.        Marland Kitchen tiotropium (SPIRIVA) 18 MCG inhalation capsule Place 18 mcg into inhaler and inhale daily.           Review of Systems  Respiratory: Positive for cough, shortness of breath and wheezing.   Cardiovascular: Negative for chest pain.   Constitutional:   No  weight loss, night sweats,  Fevers, chills, fatigue, lassitude. HEENT:   No headaches,  Difficulty swallowing,  Tooth/dental problems,  Sore throat,                No sneezing, itching, ear ache, nasal congestion, post nasal drip,   CV:  No chest pain,  Orthopnea, PND     +++ swelling in lower extremities    No  anasarca, dizziness, palpitations  GI  No heartburn, indigestion, abdominal pain, nausea, vomiting, diarrhea, change in bowel habits, loss of appetite  Resp: Notes  shortness of breath with exertion not at rest.  No excess mucus, no productive cough,  No non-productive cough,  No coughing up of blood.  No change in color of mucus.  No wheezing.  No chest wall deformity  Skin: no rash or lesions.  GU: no dysuria, change in color of urine, no urgency or frequency.  No flank pain.  MS:  No joint pain or swelling.  No decreased range of  motion.  No back pain.  Psych:  No change in mood or affect. No depression or anxiety.  No memory loss.     Objective:   Physical Exam  Filed Vitals:     11/19/11 1446  BP: 94/62  Pulse: 45  Temp: 98 F (36.7 C)  TempSrc: Oral  SpO2: 98%    Gen: Pleasant,obese ,elderly WF , in no distress,  normal affect  ENT: No lesions,  mouth clear,  oropharynx clear, no postnasal drip  Neck: No JVD, no TMG, no carotid bruits  Lungs: No use of accessory muscles, no dullness to percussion, distant BS  Cardiovascular: RRR, heart sounds normal, no murmur or gallops, 2+  peripheral edema  Abdomen: soft and NT, no HSM,  BS normal  Musculoskeletal: No deformities, no cyanosis or clubbing  Neuro: alert, non focal  Skin: Warm, no lesions or rashes        Assessment & Plan:   Acute respiratory failure Acute respiratory failure due to PNA and dehydration with Acute renal failure and volume depletion Now with relative hypotension, also prior ACE inhibitor induced renal dysfunction Now improved with resolution of PNA but ongoing pedal edema Note K 5.7 Plan Reduce amlodipine to 5mg  daily Start Spiriva D/c KCL supplement No change in lasix dosing Finish Levaquin   CAP (community acquired pneumonia) CAP  Resolved  C O P D Spiriva to resume     Updated Medication List Outpatient Encounter Prescriptions as of 11/19/2011  Medication Sig Dispense Refill  . albuterol (PROAIR HFA) 108 (90 BASE) MCG/ACT inhaler Inhale 2 puffs into the lungs every 4 (four) hours as needed. For shortness of breath      . ALPRAZolam (XANAX) 0.25 MG tablet Take 0.25 mg by mouth at bedtime as needed. For anxiety      . amLODipine (NORVASC) 10 MG tablet Take 0.5 tablets (5 mg total) by mouth daily.      Marland Kitchen atorvastatin (LIPITOR) 20 MG tablet Take 20 mg by mouth daily.       . Calcium Carbonate-Vitamin D (OSCAL 500/200 D-3 PO) Take 1 tablet by mouth daily.        . citalopram (CELEXA) 20 MG tablet Take 60 mg by mouth daily.       Marland Kitchen donepezil (ARICEPT) 10 MG tablet Take 10 mg by mouth at bedtime.       . ferrous sulfate 325 (65 FE) MG tablet Take 325 mg by  mouth daily with breakfast.        . furosemide (LASIX) 40 MG tablet Take 40 mg by mouth daily.        Marland Kitchen ibuprofen (ADVIL,MOTRIN) 200 MG tablet Take 200 mg by mouth every 8 (eight) hours as needed. For pain      . KLOR-CON M20 20 MEQ tablet Take 20 mEq by mouth daily.       Marland Kitchen levofloxacin (LEVAQUIN) 500 MG tablet Take 1 tablet (500 mg total) by mouth daily.  4 tablet  0  . levothyroxine (SYNTHROID, LEVOTHROID) 100 MCG tablet Take 100 mcg by mouth daily.        Marland Kitchen senna (SENOKOT) 8.6 MG tablet Take 2 tablets by mouth at bedtime.        . verapamil (CALAN-SR) 180 MG CR tablet Take 180 mg by mouth 2 (two) times daily.       Marland Kitchen warfarin (COUMADIN) 5 MG tablet Take 5-7.5 mg by mouth daily. Take as directed       . zolpidem (AMBIEN) 10  MG tablet Take 10 mg by mouth at bedtime as needed. For sleep      . DISCONTD: amLODipine (NORVASC) 10 MG tablet Take 1 tablet (10 mg total) by mouth daily.      Marland Kitchen DISCONTD: warfarin (COUMADIN) 5 MG tablet Take 1-1.5 tablets (5-7.5 mg total) by mouth daily. Pt will need 7.5 mg on 11/27 and then resume  7.5mg  on Sunday and 5mg  all other days  60 tablet  0  . tiotropium (SPIRIVA HANDIHALER) 18 MCG inhalation capsule Place 1 capsule (18 mcg total) into inhaler and inhale daily.  30 capsule  6  . DISCONTD: CALCIUM PO Take 1 tablet by mouth 2 (two) times daily.        Marland Kitchen DISCONTD: Cholecalciferol (VITAMIN D PO) Take 1 tablet by mouth daily.        Marland Kitchen DISCONTD: IRON PO Take 1 tablet by mouth 2 (two) times daily.        Marland Kitchen DISCONTD: tiotropium (SPIRIVA) 18 MCG inhalation capsule Place 18 mcg into inhaler and inhale daily.

## 2011-11-19 NOTE — Patient Instructions (Signed)
Reduce amlodipine to 5mg  daily Start Spiriva Finish Levaquin LAbs today Return 2 months

## 2011-11-20 NOTE — Assessment & Plan Note (Signed)
Spiriva to resume

## 2011-11-20 NOTE — Assessment & Plan Note (Signed)
Acute respiratory failure due to PNA and dehydration with Acute renal failure and volume depletion Now with relative hypotension, also prior ACE inhibitor induced renal dysfunction Now improved with resolution of PNA but ongoing pedal edema Note K 5.7 Plan Reduce amlodipine to 5mg  daily Start Spiriva D/c KCL supplement No change in lasix dosing Finish Levaquin

## 2011-11-20 NOTE — Assessment & Plan Note (Signed)
CAP  Resolved

## 2011-12-01 ENCOUNTER — Inpatient Hospital Stay (HOSPITAL_COMMUNITY)
Admission: EM | Admit: 2011-12-01 | Discharge: 2011-12-12 | DRG: 291 | Disposition: A | Discharge status: Skilled Nursing Facility | Payer: Medicare Other | Attending: Pulmonary Disease | Admitting: Pulmonary Disease

## 2011-12-01 ENCOUNTER — Other Ambulatory Visit: Payer: Self-pay

## 2011-12-01 ENCOUNTER — Emergency Department (HOSPITAL_COMMUNITY): Payer: Medicare Other

## 2011-12-01 ENCOUNTER — Encounter (HOSPITAL_COMMUNITY): Payer: Self-pay | Admitting: *Deleted

## 2011-12-01 DIAGNOSIS — E039 Hypothyroidism, unspecified: Secondary | ICD-10-CM | POA: Diagnosis present

## 2011-12-01 DIAGNOSIS — R5381 Other malaise: Secondary | ICD-10-CM | POA: Diagnosis not present

## 2011-12-01 DIAGNOSIS — I509 Heart failure, unspecified: Secondary | ICD-10-CM

## 2011-12-01 DIAGNOSIS — E876 Hypokalemia: Secondary | ICD-10-CM | POA: Diagnosis not present

## 2011-12-01 DIAGNOSIS — Z7901 Long term (current) use of anticoagulants: Secondary | ICD-10-CM

## 2011-12-01 DIAGNOSIS — D649 Anemia, unspecified: Secondary | ICD-10-CM | POA: Diagnosis present

## 2011-12-01 DIAGNOSIS — J189 Pneumonia, unspecified organism: Secondary | ICD-10-CM

## 2011-12-01 DIAGNOSIS — F0390 Unspecified dementia without behavioral disturbance: Secondary | ICD-10-CM | POA: Diagnosis present

## 2011-12-01 DIAGNOSIS — I2789 Other specified pulmonary heart diseases: Secondary | ICD-10-CM | POA: Diagnosis present

## 2011-12-01 DIAGNOSIS — C50919 Malignant neoplasm of unspecified site of unspecified female breast: Secondary | ICD-10-CM

## 2011-12-01 DIAGNOSIS — R001 Bradycardia, unspecified: Secondary | ICD-10-CM

## 2011-12-01 DIAGNOSIS — N179 Acute kidney failure, unspecified: Secondary | ICD-10-CM

## 2011-12-01 DIAGNOSIS — I359 Nonrheumatic aortic valve disorder, unspecified: Secondary | ICD-10-CM | POA: Diagnosis present

## 2011-12-01 DIAGNOSIS — Z79899 Other long term (current) drug therapy: Secondary | ICD-10-CM

## 2011-12-01 DIAGNOSIS — J9600 Acute respiratory failure, unspecified whether with hypoxia or hypercapnia: Secondary | ICD-10-CM

## 2011-12-01 DIAGNOSIS — R9389 Abnormal findings on diagnostic imaging of other specified body structures: Secondary | ICD-10-CM

## 2011-12-01 DIAGNOSIS — R609 Edema, unspecified: Secondary | ICD-10-CM

## 2011-12-01 DIAGNOSIS — T502X5A Adverse effect of carbonic-anhydrase inhibitors, benzothiadiazides and other diuretics, initial encounter: Secondary | ICD-10-CM | POA: Diagnosis not present

## 2011-12-01 DIAGNOSIS — J441 Chronic obstructive pulmonary disease with (acute) exacerbation: Secondary | ICD-10-CM

## 2011-12-01 DIAGNOSIS — F341 Dysthymic disorder: Secondary | ICD-10-CM | POA: Diagnosis present

## 2011-12-01 DIAGNOSIS — I5033 Acute on chronic diastolic (congestive) heart failure: Principal | ICD-10-CM | POA: Diagnosis present

## 2011-12-01 DIAGNOSIS — N189 Chronic kidney disease, unspecified: Secondary | ICD-10-CM | POA: Diagnosis present

## 2011-12-01 DIAGNOSIS — Z853 Personal history of malignant neoplasm of breast: Secondary | ICD-10-CM

## 2011-12-01 DIAGNOSIS — M810 Age-related osteoporosis without current pathological fracture: Secondary | ICD-10-CM | POA: Diagnosis present

## 2011-12-01 DIAGNOSIS — I129 Hypertensive chronic kidney disease with stage 1 through stage 4 chronic kidney disease, or unspecified chronic kidney disease: Secondary | ICD-10-CM | POA: Diagnosis present

## 2011-12-01 DIAGNOSIS — J449 Chronic obstructive pulmonary disease, unspecified: Secondary | ICD-10-CM

## 2011-12-01 DIAGNOSIS — Z66 Do not resuscitate: Secondary | ICD-10-CM | POA: Diagnosis present

## 2011-12-01 DIAGNOSIS — I10 Essential (primary) hypertension: Secondary | ICD-10-CM

## 2011-12-01 DIAGNOSIS — E785 Hyperlipidemia, unspecified: Secondary | ICD-10-CM | POA: Diagnosis present

## 2011-12-01 DIAGNOSIS — I4892 Unspecified atrial flutter: Secondary | ICD-10-CM | POA: Diagnosis present

## 2011-12-01 DIAGNOSIS — J158 Pneumonia due to other specified bacteria: Secondary | ICD-10-CM

## 2011-12-01 LAB — BASIC METABOLIC PANEL
BUN: 22 mg/dL (ref 6–23)
CO2: 32 mEq/L (ref 19–32)
Calcium: 9.3 mg/dL (ref 8.4–10.5)
Chloride: 94 mEq/L — ABNORMAL LOW (ref 96–112)
Creatinine, Ser: 1.01 mg/dL (ref 0.50–1.10)
GFR calc Af Amer: 56 mL/min — ABNORMAL LOW (ref >90)
GFR calc non Af Amer: 49 mL/min — ABNORMAL LOW (ref >90)
Glucose, Bld: 138 mg/dL — ABNORMAL HIGH (ref 70–99)
Potassium: 4.1 mEq/L (ref 3.5–5.1)
Sodium: 134 mEq/L — ABNORMAL LOW (ref 135–145)

## 2011-12-01 LAB — DIFFERENTIAL
Basophils Absolute: 0 10*3/uL (ref 0.0–0.1)
Basophils Relative: 0 % (ref 0–1)
Eosinophils Absolute: 0 10*3/uL (ref 0.0–0.7)
Eosinophils Relative: 0 % (ref 0–5)
Lymphocytes Relative: 3 % — ABNORMAL LOW (ref 12–46)
Lymphs Abs: 0.3 10*3/uL — ABNORMAL LOW (ref 0.7–4.0)
Monocytes Absolute: 1 10*3/uL (ref 0.1–1.0)
Monocytes Relative: 9 % (ref 3–12)
Neutro Abs: 9.4 10*3/uL — ABNORMAL HIGH (ref 1.7–7.7)
Neutrophils Relative %: 88 % — ABNORMAL HIGH (ref 43–77)

## 2011-12-01 LAB — POCT I-STAT 3, ART BLOOD GAS (G3+)
Acid-Base Excess: 6 mmol/L — ABNORMAL HIGH (ref 0.0–2.0)
Acid-Base Excess: 6 mmol/L — ABNORMAL HIGH (ref 0.0–2.0)
Acid-Base Excess: 8 mmol/L — ABNORMAL HIGH (ref 0.0–2.0)
Bicarbonate: 34.2 mEq/L — ABNORMAL HIGH (ref 20.0–24.0)
Bicarbonate: 34.8 mEq/L — ABNORMAL HIGH (ref 20.0–24.0)
Bicarbonate: 34.7 mEq/L — ABNORMAL HIGH (ref 20.0–24.0)
O2 Saturation: 98 %
Patient temperature: 98.6
Patient temperature: 98.9
Patient temperature: 98.6
pH, Arterial: 7.303 — ABNORMAL LOW (ref 7.350–7.400)
pH, Arterial: 7.373 (ref 7.350–7.400)

## 2011-12-01 LAB — CBC
HCT: 32.7 % — ABNORMAL LOW (ref 36.0–46.0)
Hemoglobin: 10.3 g/dL — ABNORMAL LOW (ref 12.0–15.0)
MCH: 30.1 pg (ref 26.0–34.0)
MCHC: 31.5 g/dL (ref 30.0–36.0)
MCV: 95.6 fL (ref 78.0–100.0)
MCV: 95.3 fL (ref 78.0–100.0)
Platelets: 168 10*3/uL (ref 150–400)
Platelets: 139 10*3/uL — ABNORMAL LOW (ref 150–400)
RBC: 3.42 MIL/uL — ABNORMAL LOW (ref 3.87–5.11)
RDW: 14.9 % (ref 11.5–15.5)
RDW: 14.5 % (ref 11.5–15.5)
WBC: 10.7 10*3/uL — ABNORMAL HIGH (ref 4.0–10.5)
WBC: 6.3 10*3/uL (ref 4.0–10.5)

## 2011-12-01 LAB — URINALYSIS, ROUTINE W REFLEX MICROSCOPIC
Bilirubin Urine: NEGATIVE
Ketones, ur: NEGATIVE mg/dL
Leukocytes, UA: NEGATIVE
Nitrite: NEGATIVE
Protein, ur: NEGATIVE mg/dL
Urobilinogen, UA: 0.2 mg/dL (ref 0.0–1.0)
pH: 5 (ref 5.0–8.0)

## 2011-12-01 LAB — CARDIAC PANEL(CRET KIN+CKTOT+MB+TROPI)
CK, MB: 2.8 ng/mL (ref 0.3–4.0)
Relative Index: INVALID (ref 0.0–2.5)
Total CK: 35 U/L (ref 7–177)
Troponin I: <0.3 ng/mL (ref <0.30)

## 2011-12-01 LAB — CREATININE, SERUM
Creatinine, Ser: 1.01 mg/dL (ref 0.50–1.10)
GFR calc Af Amer: 56 mL/min — ABNORMAL LOW (ref >90)

## 2011-12-01 LAB — PROCALCITONIN: Procalcitonin: 0.33 ng/mL

## 2011-12-01 LAB — PRO B NATRIURETIC PEPTIDE: Pro B Natriuretic peptide (BNP): 2485 pg/mL — ABNORMAL HIGH (ref 0–450)

## 2011-12-01 MED ORDER — ALBUTEROL SULFATE (5 MG/ML) 0.5% IN NEBU
5.0000 mg | INHALATION_SOLUTION | Freq: Once | RESPIRATORY_TRACT | Status: AC
  Administered 2011-12-01: 5 mg via RESPIRATORY_TRACT
  Filled 2011-12-01: qty 1

## 2011-12-01 MED ORDER — PIPERACILLIN-TAZOBACTAM 3.375 G IVPB
3.3750 g | Freq: Three times a day (TID) | INTRAVENOUS | Status: DC
  Administered 2011-12-02: 3.375 g via INTRAVENOUS
  Filled 2011-12-01 (×3): qty 50

## 2011-12-01 MED ORDER — DONEPEZIL HCL 10 MG PO TABS
10.0000 mg | ORAL_TABLET | Freq: Every day | ORAL | Status: DC
  Administered 2011-12-01 – 2011-12-11 (×11): 10 mg via ORAL
  Filled 2011-12-01 (×12): qty 1

## 2011-12-01 MED ORDER — ASPIRIN 81 MG PO CHEW
324.0000 mg | CHEWABLE_TABLET | ORAL | Status: AC
  Administered 2011-12-01: 324 mg via ORAL
  Filled 2011-12-01: qty 4

## 2011-12-01 MED ORDER — FUROSEMIDE 10 MG/ML IJ SOLN
INTRAMUSCULAR | Status: AC
  Administered 2011-12-01: 10:00:00
  Filled 2011-12-01: qty 10

## 2011-12-01 MED ORDER — IPRATROPIUM BROMIDE 0.02 % IN SOLN
0.5000 mg | RESPIRATORY_TRACT | Status: DC
  Administered 2011-12-02 – 2011-12-03 (×7): 0.5 mg via RESPIRATORY_TRACT
  Filled 2011-12-01 (×7): qty 2.5

## 2011-12-01 MED ORDER — FERROUS SULFATE 325 (65 FE) MG PO TABS
325.0000 mg | ORAL_TABLET | Freq: Every day | ORAL | Status: DC
  Administered 2011-12-02 – 2011-12-12 (×11): 325 mg via ORAL
  Filled 2011-12-01 (×14): qty 1

## 2011-12-01 MED ORDER — CITALOPRAM HYDROBROMIDE 20 MG PO TABS
20.0000 mg | ORAL_TABLET | Freq: Every day | ORAL | Status: DC
  Administered 2011-12-01 – 2011-12-12 (×12): 20 mg via ORAL
  Filled 2011-12-01 (×12): qty 1

## 2011-12-01 MED ORDER — PIPERACILLIN-TAZOBACTAM 3.375 G IVPB 30 MIN
3.3750 g | Freq: Three times a day (TID) | INTRAVENOUS | Status: DC
  Administered 2011-12-01: 3.375 g via INTRAVENOUS
  Filled 2011-12-01 (×3): qty 50

## 2011-12-01 MED ORDER — ASPIRIN 300 MG RE SUPP: 300.0000 mg | RECTAL | Status: AC

## 2011-12-01 MED ORDER — SENNA 8.6 MG PO TABS
2.0000 | ORAL_TABLET | Freq: Every day | ORAL | Status: DC
  Administered 2011-12-01 – 2011-12-11 (×11): 17.2 mg via ORAL
  Filled 2011-12-01 (×12): qty 2

## 2011-12-01 MED ORDER — AMLODIPINE BESYLATE 5 MG PO TABS
5.0000 mg | ORAL_TABLET | Freq: Every day | ORAL | Status: DC
  Administered 2011-12-01 – 2011-12-12 (×12): 5 mg via ORAL
  Filled 2011-12-01 (×12): qty 1

## 2011-12-01 MED ORDER — TIOTROPIUM BROMIDE MONOHYDRATE 18 MCG IN CAPS
18.0000 ug | ORAL_CAPSULE | Freq: Every day | RESPIRATORY_TRACT | Status: DC
  Administered 2011-12-05 – 2011-12-08 (×4): 18 ug via RESPIRATORY_TRACT
  Filled 2011-12-01 (×2): qty 5

## 2011-12-01 MED ORDER — ALBUTEROL SULFATE (5 MG/ML) 0.5% IN NEBU
2.5000 mg | INHALATION_SOLUTION | RESPIRATORY_TRACT | Status: DC
  Administered 2011-12-02 – 2011-12-03 (×7): 2.5 mg via RESPIRATORY_TRACT
  Filled 2011-12-01 (×7): qty 0.5

## 2011-12-01 MED ORDER — MOXIFLOXACIN HCL IN NACL 400 MG/250ML IV SOLN
400.0000 mg | Freq: Once | INTRAVENOUS | Status: AC
  Administered 2011-12-01: 400 mg via INTRAVENOUS
  Filled 2011-12-01: qty 250

## 2011-12-01 MED ORDER — VANCOMYCIN HCL 1000 MG IV SOLR
1250.0000 mg | INTRAVENOUS | Status: DC
  Filled 2011-12-01: qty 1250

## 2011-12-01 MED ORDER — LEVOTHYROXINE SODIUM 100 MCG PO TABS
100.0000 ug | ORAL_TABLET | Freq: Every day | ORAL | Status: DC
  Administered 2011-12-02 – 2011-12-12 (×11): 100 ug via ORAL
  Filled 2011-12-01 (×14): qty 1

## 2011-12-01 MED ORDER — ALPRAZOLAM 0.25 MG PO TABS
0.2500 mg | ORAL_TABLET | Freq: Every evening | ORAL | Status: DC | PRN
  Administered 2011-12-02 – 2011-12-10 (×7): 0.25 mg via ORAL
  Filled 2011-12-01 (×8): qty 1

## 2011-12-01 MED ORDER — SIMVASTATIN 20 MG PO TABS
20.0000 mg | ORAL_TABLET | Freq: Every day | ORAL | Status: DC
  Administered 2011-12-01 – 2011-12-04 (×4): 20 mg via ORAL
  Filled 2011-12-01 (×5): qty 1

## 2011-12-01 MED ORDER — SODIUM CHLORIDE 0.9 % IV SOLN
250.0000 mL | INTRAVENOUS | Status: DC | PRN
  Administered 2011-12-01: 23:00:00 via INTRAVENOUS

## 2011-12-01 MED ORDER — VERAPAMIL HCL ER 180 MG PO TBCR
180.0000 mg | EXTENDED_RELEASE_TABLET | Freq: Two times a day (BID) | ORAL | Status: DC
  Administered 2011-12-01 – 2011-12-07 (×12): 180 mg via ORAL
  Filled 2011-12-01 (×13): qty 1

## 2011-12-01 MED ORDER — FUROSEMIDE 10 MG/ML IJ SOLN
40.0000 mg | Freq: Two times a day (BID) | INTRAMUSCULAR | Status: DC
  Administered 2011-12-02 – 2011-12-03 (×3): 40 mg via INTRAVENOUS
  Filled 2011-12-01 (×5): qty 4

## 2011-12-01 MED ORDER — VANCOMYCIN HCL 1000 MG IV SOLR
1250.0000 mg | Freq: Once | INTRAVENOUS | Status: AC
  Administered 2011-12-01: 1250 mg via INTRAVENOUS
  Filled 2011-12-01: qty 1250

## 2011-12-01 MED ORDER — METHYLPREDNISOLONE SODIUM SUCC 125 MG IJ SOLR
80.0000 mg | Freq: Two times a day (BID) | INTRAMUSCULAR | Status: DC
  Administered 2011-12-01: 80 mg via INTRAVENOUS
  Administered 2011-12-02: 11:00:00 via INTRAVENOUS
  Administered 2011-12-02: 80 mg via INTRAVENOUS
  Administered 2011-12-03: 10:00:00 via INTRAVENOUS
  Filled 2011-12-01: qty 2
  Filled 2011-12-01 (×4): qty 1.28

## 2011-12-01 MED ORDER — POLYETHYLENE GLYCOL 3350 17 G PO PACK
17.0000 g | PACK | Freq: Every day | ORAL | Status: DC
  Filled 2011-12-01: qty 1

## 2011-12-01 NOTE — ED Notes (Signed)
The patient is resting comfortably in bed.  She shows no signs of symptoms of any allergic reactions to her IV Avelox.  The patient's family is at the bedside with the call light within reach.  Family is aware the patient is to be admitted as an inpatient.

## 2011-12-01 NOTE — ED Notes (Signed)
3304-01 ready

## 2011-12-01 NOTE — Progress Notes (Signed)
Pt has had second ABG drawn.  Results unchanged.  Pt continues to have a respiratory acidosis.  Will continue NIV 16/8, BUR 12, Fio2 40% and will re-check ABG in four hours from last drawn.  Pt continues to tolerate NIV well.  VS stable.  Will continue to monitor.

## 2011-12-01 NOTE — ED Notes (Signed)
Pt started having sob at 0800 this am and sats were in the 70s on ems arrival on high flow o2.  Pt placed on CPAP.  Pt has had Lasix 80mg  iv enroute to er.  Originally had rales and now diminished.  Pt had lethargy on arrival by ems.  RT to bedside and placed on Bipap

## 2011-12-01 NOTE — ED Notes (Signed)
Patient removed from BiPap per Gena, respiratory therapist. Patient in no distress.

## 2011-12-01 NOTE — ED Notes (Signed)
Son states pt has returned to her baseline respiratory state. That is to say, on 4 liters per minute o2 via [place], accessory muscle usage. Speaking in short phrases. Pt wants something to eat.Pt has orders for NPO. Will call admitting for diet.

## 2011-12-01 NOTE — ED Provider Notes (Signed)
Medical screening examination/treatment/procedure(s) were performed by non-physician practitioner and as supervising physician I was immediately available for consultation/collaboration.   Laray Anger, DO 12/01/11 2032

## 2011-12-01 NOTE — ED Provider Notes (Signed)
History     CSN: [id number] Arrival date & time: 12/01/2011 10:13 AM   First MD Initiated Contact with Patient 12/01/11 1032      Chief Complaint  Patient presents with  . Respiratory Distress    (Consider location/radiation/quality/duration/timing/severity/associated sxs/prior treatment) The history is provided by the nursing home, the EMS personnel and a relative.   Patient presents to the emergency department today due to increasing shortness of breath and respiratory distress.  Patient's grandson works at [employer] that she is currently residing.  He states that he went to check on her today and noticed she was having significant rest or distress and using accessory muscles.  He states that he got the nursing staff to check on the patient as well and her pulse ox was 82% on her 4 L of oxygen.  He was recently in the hospital for hydration and respiratory distress as well.  The patient arrives on BiPAP.  EMS also gave 80 mg of Lasix in route.  Full review of systems and history of present illness components were not obtainable due to patient condition.     Past Medical History  Diagnosis Date  . COPD (chronic obstructive pulmonary disease)   . Hyperlipidemia   . Hypertension   . Osteoporosis   . Hypothyroid   . Memory loss   . Anemia     Presumed GI on iron replacement hemoglobin in the 10 range  . Atrial flutter     Coumadin initiated 7/12 and followed by PCP; patient felt to be high risk for RFCA secondary to COPD;  echo was done 7/30:  EF 50% to 55%. Wall motion was normal ;there were no regional wall motion abnormalities, mild AS, MAC, mild LAE, mild RAE, PASP 35, mild pulmonary HTN  . On home O2   . Anxiety   . Depression   . CHF (congestive heart failure)   . Emphysema   . Chronic kidney disease   . Breast cancer   . DEMENTIA 11/04/11    "she has some significant dementia"    Past Surgical History  Procedure Date  . Tonsillectomy   . Appendectomy   .  Abdominal hysterectomy   . Mastectomy 1990    right  . Cataract extraction     "don't know if both eyes or if she had lenses put in"    Family History  Problem Relation Age of Onset  . Emphysema    . Heart attack    . Diabetes    . Hypertension    . Cancer    . Anxiety disorder    . Depression    . Coronary artery disease      History  Substance Use Topics  . Smoking status: Never Smoker   . Smokeless tobacco: Never Used  . Alcohol Use: No    OB History    Grav Para Term Preterm Abortions TAB SAB Ect Mult Living                  Review of Systems Full review of systems was not obtainable due to patient condition.  Level V caveat applies Allergies  Codeine  Home Medications   Current Outpatient Rx  Name Route Sig Dispense Refill  . ALBUTEROL SULFATE HFA 108 (90 BASE) MCG/ACT IN AERS Inhalation Inhale 2 puffs into the lungs every 4 (four) hours as needed. For shortness of breath    . ALPRAZOLAM 0.25 MG PO TABS Oral Take 0.25 mg by mouth at  bedtime as needed. For anxiety    . AMLODIPINE BESYLATE 5 MG PO TABS Oral Take 5 mg by mouth daily.      . ATORVASTATIN CALCIUM 20 MG PO TABS Oral Take 20 mg by mouth at bedtime.     Ruthell Rummage 500/200 D-3 PO Oral Take 1 tablet by mouth daily.      Marland Kitchen CITALOPRAM HYDROBROMIDE 20 MG PO TABS Oral Take 20 mg by mouth.      . DONEPEZIL HCL 10 MG PO TABS Oral Take 10 mg by mouth at bedtime.     Marland Kitchen FERROUS SULFATE 325 (65 FE) MG PO TABS Oral Take 325 mg by mouth daily with breakfast.      . FUROSEMIDE 20 MG PO TABS Oral Take 20 mg by mouth daily.      Marland Kitchen LEVOTHYROXINE SODIUM 100 MCG PO TABS Oral Take 100 mcg by mouth daily.      Marland Kitchen POLYETHYLENE GLYCOL 3350 PO PACK Oral Take 17 g by mouth daily.      . SENNOSIDES 8.6 MG PO TABS Oral Take 2 tablets by mouth at bedtime.      Marland Kitchen TIOTROPIUM BROMIDE MONOHYDRATE 18 MCG IN CAPS Inhalation Place 1 capsule (18 mcg total) into inhaler and inhale daily. 30 capsule 6  . VERAPAMIL HCL ER 180 MG PO TBCR Oral  Take 180 mg by mouth 2 (two) times daily.     . WARFARIN SODIUM 2 MG PO TABS Oral Take 2 mg by mouth daily. Take with 5 mg tablet to equal 7 mg.     . WARFARIN SODIUM 5 MG PO TABS Oral Take 5 mg by mouth daily. Take with 2 mg tablet to equal 7 mg.     Marland Kitchen ZOLPIDEM TARTRATE 10 MG PO TABS Oral Take 10 mg by mouth at bedtime as needed. For sleep    . IBUPROFEN 200 MG PO TABS Oral Take 200 mg by mouth every 8 (eight) hours as needed. For pain      BP 121/56  Pulse 75  Temp(Src) 98.9 F (37.2 C) (Rectal)  Resp 23  Ht 5' (1.524 m)  Wt 190 lb (86.183 kg)  BMI 37.11 kg/m2  SpO2 97%  Physical Exam  Constitutional: She appears well-developed and well-nourished. She appears distressed.  HENT:  Head: Normocephalic and atraumatic.  Eyes: Pupils are equal, round, and reactive to light.  Neck: Normal range of motion. Neck supple.  Cardiovascular: Normal rate and regular rhythm.  Exam reveals no gallop and no friction rub.   No murmur heard. Pulmonary/Chest: She is in respiratory distress. She has wheezes. She has rales.  Musculoskeletal: Normal range of motion.  Neurological: She is alert.  Skin: Skin is warm and dry. No rash noted.    ED Course  Procedures (including critical care time)  Labs Reviewed  CBC - Abnormal; Notable for the following:    WBC 10.7 (*) WHITE COUNT CONFIRMED ON SMEAR   RBC 3.42 (*)    Hemoglobin 10.3 (*)    HCT 32.7 (*)    All other components within normal limits  DIFFERENTIAL - Abnormal; Notable for the following:    Neutrophils Relative 88 (*)    Lymphocytes Relative 3 (*)    Neutro Abs 9.4 (*)    Lymphs Abs 0.3 (*)    All other components within normal limits  BASIC METABOLIC PANEL - Abnormal; Notable for the following:    Sodium 134 (*)    Chloride 94 (*)    Glucose,  Bld 138 (*)    GFR calc non Af Amer 49 (*)    GFR calc Af Amer 56 (*)    All other components within normal limits  PRO B NATRIURETIC PEPTIDE - Abnormal; Notable for the following:     Pro B Natriuretic peptide (BNP) 2485.0 (*)    All other components within normal limits  POCT I-STAT 3, BLOOD GAS (G3+) - Abnormal; Notable for the following:    pH, Arterial 7.295 (*)    pCO2 arterial 70.2 (*)    pO2, Arterial 114.0 (*)    Bicarbonate 34.2 (*)    Acid-Base Excess 6.0 (*)    All other components within normal limits  POCT I-STAT 3, BLOOD GAS (G3+) - Abnormal; Notable for the following:    pH, Arterial 7.303 (*)    pCO2 arterial 70.4 (*)    pO2, Arterial 102.0 (*)    Bicarbonate 34.8 (*)    Acid-Base Excess 6.0 (*)    All other components within normal limits  CARDIAC PANEL(CRET KIN+CKTOT+MB+TROPI)  URINALYSIS, ROUTINE W REFLEX MICROSCOPIC  CULTURE, BLOOD (ROUTINE X 2)  CULTURE, BLOOD (ROUTINE X 2)  I-STAT 3, BLOOD GAS (G3+)   Dg Chest Portable 1 View  12/01/2011  *RADIOLOGY REPORT*  Clinical Data: Shortness of breath  PORTABLE CHEST - 1 VIEW  Comparison: 11/08/2011  Findings: Cardio mediastinal silhouette is stable.  Worsening central vascular congestion and perihilar interstitial prominence suspicious for mild interstitial edema.  Surgical clips in the right axilla again noted.  There is patchy airspace disease in the right upper lobe and right base suspicious for superimposed pneumonia.  IMPRESSION:   Worsening central vascular congestion and perihilar interstitial prominence suspicious for mild interstitial edema.  Surgical clips in the right axilla again noted.  There is patchy airspace disease in the right upper lobe and right base suspicious for superimposed pneumonia.  Original Report Authenticated By: Natasha Mead, M.D.     1. C O P D   2. Healthcare-associated pneumonia   3. CHF (congestive heart failure)       MDM  MDM Reviewed: previous chart, nursing note and vitals Reviewed previous: labs, ECG and x-ray Interpretation: labs, ECG and x-ray Consults: pulmonary (PCCM will be down to see the patient)   CRITICAL CARE Performed by: Carlyle Dolly   Total critical care time:77mins  Critical care time was exclusive of separately billable procedures and treating other patients.  Critical care was necessary to treat or prevent imminent or life-threatening deterioration.  Critical care was time spent personally by me on the following activities: development of treatment plan with patient and/or surrogate as well as nursing, discussions with consultants, evaluation of patient's response to treatment, examination of patient, obtaining history from patient or surrogate, ordering and performing treatments and interventions, ordering and review of laboratory studies, ordering and review of radiographic studies, pulse oximetry and re-evaluation of patient's condition.        Carlyle Dolly, PA-C 12/01/11 1531

## 2011-12-01 NOTE — Progress Notes (Addendum)
Pharmacy Consult for coumadin, vancomycin, zosyn Indication: aflutter, probable HCAP  Allergies  Allergen Reactions  . Codeine     REACTION: nausea    Patient Measurements: Height: 5' (152.4 cm) Weight: 190 lb (86.183 kg) IBW/kg (Calculated) : 45.5   Vital Signs: Temp: 98.8 F (37.1 C) (12/17 1920) Temp src: Oral (12/17 1920) BP: 157/67 mmHg (12/17 1920) Pulse Rate: 84  (12/17 1920)  Labs:  Basename 12/01/11 1044 12/01/11 1043  HGB -- 10.3*  HCT -- 32.7*  PLT -- 168  APTT -- --  LABPROT -- --  INR -- --  HEPARINUNFRC -- --  CREATININE -- 1.01  CKTOTAL 35 --  CKMB 2.8 --  TROPONINI <0.30 --   Estimated Creatinine Clearance: 38.3 ml/min (by C-G formula based on Cr of 1.01).  Medical History: Past Medical History  Diagnosis Date  . COPD (chronic obstructive pulmonary disease)   . Hyperlipidemia   . Hypertension   . Osteoporosis   . Hypothyroid   . Memory loss   . Anemia     Presumed GI on iron replacement hemoglobin in the 10 range  . Atrial flutter     Coumadin initiated 7/12 and followed by PCP; patient felt to be high risk for RFCA secondary to COPD;  echo was done 7/30:  EF 50% to 55%. Wall motion was normal ;there were no regional wall motion abnormalities, mild AS, MAC, mild LAE, mild RAE, PASP 35, mild pulmonary HTN  . On home O2   . Anxiety   . Depression   . CHF (congestive heart failure)   . Emphysema   . Chronic kidney disease   . Breast cancer   . DEMENTIA 11/04/11    "she has some significant dementia"    Assessment: 77 yo female with COPD and HF to start antibiotics (vancomycin/zosyn)for likely HCAP. Patient also noted with history of aflutter on coumadin PTA and to continue while admitted (on coumadin 7mg /day PTA last dose on 12/16).  Goal of Therapy:  Vancomycin level=15-20 INR=2-3   Plan:  1)  Will begin vancomycin 1250mg  IV q24hr and asses levels as needed 2) Will give Zosyn 3.375gm IV q8hr 3) INR = 4.08, will not given warfarin  dose tonight, f/u INR in am.   Benny Lennert 12/01/2011,8:32 PM  Juliette Alcide, PharmD, BCPS.  12/01/2011 22:00

## 2011-12-01 NOTE — ED Notes (Signed)
100mg  Lasix taken from pyxis and returned to EMS

## 2011-12-01 NOTE — H&P (Signed)
Name: [name] MRN: [id number] DOB: 1922/08/03    LOS: 0  PCCM ADMISSION NOTE  History of Present Illness: 77 y/o WF with COPD and CHF recently admitted for pneumonia and discharged to nursing home brought to [facility] ED with worsening nonproductive cough, lower extremities swelling and respiratory difficulty.  Found to be in hypercarbic respiratory failure, placed on BiPAP.  Lines / Drains: 12/17  Foley  Cultures: 12/17  BC>>>  Antibiotics: 12/17  Vancomycin 12/17  Zosyne  Tests / Events: 12/17  Admitted for hypercarbic respiratory failure  The patient is sedated, intubated and unable to provide history, which was obtained for available medical records.    Past Medical History  Diagnosis Date  . COPD (chronic obstructive pulmonary disease)   . Hyperlipidemia   . Hypertension   . Osteoporosis   . Hypothyroid   . Memory loss   . Anemia     Presumed GI on iron replacement hemoglobin in the 10 range  . Atrial flutter     Coumadin initiated 7/12 and followed by PCP; patient felt to be high risk for RFCA secondary to COPD;  echo was done 7/30:  EF 50% to 55%. Wall motion was normal ;there were no regional wall motion abnormalities, mild AS, MAC, mild LAE, mild RAE, PASP 35, mild pulmonary HTN  . On home O2   . Anxiety   . Depression   . CHF (congestive heart failure)   . Emphysema   . Chronic kidney disease   . Breast cancer   . DEMENTIA 11/04/11    "she has some significant dementia"   Past Surgical History  Procedure Date  . Tonsillectomy   . Appendectomy   . Abdominal hysterectomy   . Mastectomy 1990    right  . Cataract extraction     "don't know if both eyes or if she had lenses put in"   Prior to Admission medications   Medication Sig Start Date End Date Taking? Authorizing Provider  albuterol (PROAIR HFA) 108 (90 BASE) MCG/ACT inhaler Inhale 2 puffs into the lungs every 4 (four) hours as needed. For shortness of breath   Yes Historical Provider, MD  ALPRAZolam  (XANAX) 0.25 MG tablet Take 0.25 mg by mouth at bedtime as needed. For anxiety   Yes Historical Provider, MD  amLODipine (NORVASC) 5 MG tablet Take 5 mg by mouth daily.     Yes Historical Provider, MD  atorvastatin (LIPITOR) 20 MG tablet Take 20 mg by mouth at bedtime.    Yes Historical Provider, MD  Calcium Carbonate-Vitamin D (OSCAL 500/200 D-3 PO) Take 1 tablet by mouth daily.     Yes Historical Provider, MD  citalopram (CELEXA) 20 MG tablet Take 20 mg by mouth.     Yes Historical Provider, MD  donepezil (ARICEPT) 10 MG tablet Take 10 mg by mouth at bedtime.    Yes Historical Provider, MD  ferrous sulfate 325 (65 FE) MG tablet Take 325 mg by mouth daily with breakfast.     Yes Historical Provider, MD  furosemide (LASIX) 20 MG tablet Take 20 mg by mouth daily.     Yes Historical Provider, MD  levothyroxine (SYNTHROID, LEVOTHROID) 100 MCG tablet Take 100 mcg by mouth daily.     Yes Historical Provider, MD  polyethylene glycol (MIRALAX / GLYCOLAX) packet Take 17 g by mouth daily.     Yes Historical Provider, MD  senna (SENOKOT) 8.6 MG tablet Take 2 tablets by mouth at bedtime.     Yes  Historical Provider, MD  tiotropium (SPIRIVA HANDIHALER) 18 MCG inhalation capsule Place 1 capsule (18 mcg total) into inhaler and inhale daily. 11/19/11 11/18/12 Yes Shan Levans, MD  verapamil (CALAN-SR) 180 MG CR tablet Take 180 mg by mouth 2 (two) times daily.  08/29/11 08/28/12 Yes Beatrice Lecher, PA  warfarin (COUMADIN) 2 MG tablet Take 2 mg by mouth daily. Take with 5 mg tablet to equal 7 mg.    Yes Historical Provider, MD  warfarin (COUMADIN) 5 MG tablet Take 5 mg by mouth daily. Take with 2 mg tablet to equal 7 mg.    Yes Historical Provider, MD  zolpidem (AMBIEN) 10 MG tablet Take 10 mg by mouth at bedtime as needed. For sleep   Yes Historical Provider, MD  ibuprofen (ADVIL,MOTRIN) 200 MG tablet Take 200 mg by mouth every 8 (eight) hours as needed. For pain    Historical Provider, MD   Allergies Allergies    Allergen Reactions  . Codeine     REACTION: nausea   Family History Family History  Problem Relation Age of Onset  . Emphysema    . Heart attack    . Diabetes    . Hypertension    . Cancer    . Anxiety disorder    . Depression    . Coronary artery disease     Social History  reports that she has never smoked. She has never used smokeless tobacco. She reports that she does not drink alcohol or use illicit drugs.  Review Of Systems  11 points review of systems is negative with an exception of listed in HPI.  Vital Signs: Temp:  [98.9 F (37.2 C)] 98.9 F (37.2 C) (12/17 1249) Pulse Rate:  [69-89] 69  (12/17 1515) Resp:  [23-25] 23  (12/17 1020) BP: (121-155)/(56-68) 129/62 mmHg (12/17 1515) SpO2:  [97 %-100 %] 98 % (12/17 1515) FiO2 (%):  [40 %-60 %] 40 % (12/17 1515) Weight:  [86.183 kg (190 lb)] 190 lb (86.183 kg) (12/17 1100)   Physical Examination: General:  No acute distress, synchronous with BiPAP Neuro:  Awake, alert, nonfocal   HEENT:  PERRL Neck:  Cannot assess JVD   Cardiovascular:  No murmurs Lungs:  Bilateral diminished air entry, bibasilar rales, expiratory wheezes, prolonged expiratory phase Abdomen:  Soft, nontender, bowel sounds present Musculoskeletal:  Anasarca Skin:  No rash  Ventilator settings: Vent Mode:  [-]  FiO2 (%):  [40 %-60 %] 40 %  Labs and Imaging:  Reviewed.  Please refer to the Assessment and Plan section for relevant results.  Assessment and Plan:  COPD exacerbation -->bronchodilators -->Solu-Medrol  CHF exacerbation -->Lasix IV -->Is/Os  Pneumonia, likely HCAP  Lab 12/01/11 1043  WBC 10.7*   -->cultures, PCT -->Vancomycin / Zosyn -->repeat CXR in AM  Acute hypercarbic respiratory failure secondary to above  Lab 12/01/11 1746 12/01/11 1332 12/01/11 1101  PHART 7.373 7.303* 7.295*  PCO2ART 59.7* 70.4* 70.2*  PO2ART 103.0* 102.0* 114.0*  HCO3 34.7* 34.8* 34.2*  TCO2 36 37 36  O2SAT 98.0 97.0 98.0    -->BiPAP  Atrial flutter / HTN -->continue Coumadin -->Verapamil -->Norvasc  Hypothyroidism -->continue Levothyroxine  Dementia / depression -->Celexa, Aricept  Dyslipidemia -->Simvastatin  Best practices / Disposition -->SDU status under PCCM -->DNR/DNI -->DVT Px is not indicated (Coumdain) -->GI Px is not indicated -->NPO except Rx -->family updated at bedside  The patient is critically ill with multiple organ systems failure and requires high complexity decision making for assessment and support, frequent evaluation and  titration of therapies, application of advanced monitoring technologies and extensive interpretation of multiple databases. Critical Care Time devoted to patient care services described in this note is 35 minutes.  Orlean Bradford, M.D. Pulmonary and Critical Care Medicine [facility] Cell: [phone] Pager: [phone]  12/01/2011, 3:41 PM

## 2011-12-02 ENCOUNTER — Inpatient Hospital Stay (HOSPITAL_COMMUNITY): Payer: Medicare Other

## 2011-12-02 ENCOUNTER — Other Ambulatory Visit: Payer: Self-pay

## 2011-12-02 DIAGNOSIS — I509 Heart failure, unspecified: Secondary | ICD-10-CM

## 2011-12-02 DIAGNOSIS — J9600 Acute respiratory failure, unspecified whether with hypoxia or hypercapnia: Secondary | ICD-10-CM

## 2011-12-02 DIAGNOSIS — J158 Pneumonia due to other specified bacteria: Secondary | ICD-10-CM

## 2011-12-02 DIAGNOSIS — J441 Chronic obstructive pulmonary disease with (acute) exacerbation: Secondary | ICD-10-CM

## 2011-12-02 LAB — PHOSPHORUS: Phosphorus: 3.3 mg/dL (ref 2.3–4.6)

## 2011-12-02 LAB — CBC
MCH: 30.4 pg (ref 26.0–34.0)
MCV: 95.6 fL (ref 78.0–100.0)
Platelets: 149 10*3/uL — ABNORMAL LOW (ref 150–400)
RDW: 14.7 % (ref 11.5–15.5)
WBC: 5.8 10*3/uL (ref 4.0–10.5)

## 2011-12-02 LAB — PROTIME-INR: Prothrombin Time: 37.1 seconds — ABNORMAL HIGH (ref 11.6–15.2)

## 2011-12-02 LAB — CARDIAC PANEL(CRET KIN+CKTOT+MB+TROPI)
CK, MB: 2.3 ng/mL (ref 0.3–4.0)
Relative Index: INVALID (ref 0.0–2.5)
Relative Index: INVALID (ref 0.0–2.5)
Relative Index: INVALID (ref 0.0–2.5)
Total CK: 34 U/L (ref 7–177)
Total CK: 32 U/L (ref 7–177)
Troponin I: <0.3 ng/mL (ref <0.30)
Troponin I: <0.3 ng/mL (ref <0.30)

## 2011-12-02 LAB — BASIC METABOLIC PANEL
Calcium: 8.9 mg/dL (ref 8.4–10.5)
Creatinine, Ser: 0.88 mg/dL (ref 0.50–1.10)
GFR calc Af Amer: 67 mL/min — ABNORMAL LOW (ref >90)
GFR calc non Af Amer: 57 mL/min — ABNORMAL LOW (ref >90)

## 2011-12-02 LAB — URINE CULTURE
Colony Count: NO GROWTH
Culture: NO GROWTH

## 2011-12-02 MED ORDER — WHITE PETROLATUM GEL
Status: AC
  Administered 2011-12-02: 19:00:00
  Filled 2011-12-02: qty 5

## 2011-12-02 MED ORDER — MOXIFLOXACIN HCL IN NACL 400 MG/250ML IV SOLN
400.0000 mg | INTRAVENOUS | Status: DC
  Administered 2011-12-02 – 2011-12-03 (×2): 400 mg via INTRAVENOUS
  Filled 2011-12-02 (×3): qty 250

## 2011-12-02 NOTE — Progress Notes (Signed)
ANTICOAGULATION CONSULT NOTE - Follow Up Consult  Pharmacy Consult for Coumadin Indication: Hx aflutter  Allergies  Allergen Reactions  . Codeine     REACTION: nausea    Patient Measurements: Height: 5\' 4"  (162.6 cm) Weight: 192 lb 3.9 oz (87.2 kg) IBW/kg (Calculated) : 54.7  Adjusted Body Weight:   Vital Signs: Temp: 98.3 F (36.8 C) (12/18 0800) Temp src: Oral (12/18 0800) BP: 137/63 mmHg (12/18 0800) Pulse Rate: 80  (12/18 0800)  Labs:  Basename 12/02/11 0330 12/02/11 0022 12/01/11 2106 12/01/11 1044 12/01/11 1043  HGB 10.4* -- 9.6* -- --  HCT 32.7* -- 30.3* -- 32.7*  PLT 149* -- 139* -- 168  APTT -- -- -- -- --  LABPROT 37.1* -- 40.2* -- --  INR 3.68* -- 4.08* -- --  HEPARINUNFRC -- -- -- -- --  CREATININE 0.88 -- 1.01 -- 1.01  CKTOTAL -- 40 -- 35 --  CKMB -- 2.6 -- 2.8 --  TROPONINI -- <0.30 -- <0.30 --   Estimated Creatinine Clearance: 48.1 ml/min (by C-G formula based on Cr of 0.88).   Assessment: 89yof on Coumadin for h/o aflutter. INR (3.68) remains supratherapeutic. Will hold Coumadin tonight and allow to trend down 1 more day.  - H/H and Plts improved - No bleeding reported  Goal of Therapy:  INR 2-3   Plan:  1. No Coumadin today 2. Follow-up AM INR  Cleon Dew [phone] 12/02/2011,11:20 AM

## 2011-12-02 NOTE — Clinical Documentation Improvement (Signed)
CHF DOCUMENTATION CLARIFICATION QUERY  THIS DOCUMENT IS NOT A PERMANENT PART OF THE MEDICAL RECORD  TO RESPOND TO THE THIS QUERY, FOLLOW THE INSTRUCTIONS BELOW:  1. If needed, update documentation for the patient's encounter via the notes activity.  2. Access this query again and click edit on the In [employer].  3. After updating, or not, click F2 to complete all highlighted (required) fields concerning your review. Select "additional documentation in the medical record" OR "no additional documentation provided".  4. Click Sign note button.  5. The deficiency will fall out of your In Basket *Please let us know if you are not able to complete this workflow by phone or e-mail (listed below).  Please update your documentation within the medical record to reflect your response to this query.                                                                                    12/02/11  Dear Dr. Kirtland Bouchard. [facility],  In a better effort to capture your patient's severity of illness, reflect appropriate length of stay and utilization of resources, a review of the patient medical record has revealed the following indicators the diagnosis of Heart Failure.    Based on your clinical judgment, please clarify and document in a progress note and/or discharge summary the clinical condition associated with the following supporting information:  In responding to this query please exercise your independent judgment.  The fact that a query is asked, does not imply that any particular answer is desired or expected.  Noted in H + P  "chf exacerbation"  for clarity of patient's condition on admission please document the type of chf if known .  Thank you    Possible Clinical Conditions?    Systolic Congestive Heart Failure Diastolic Congestive Heart Failure Systolic & Diastolic Congestive Heart Failure Chronic Systolic Congestive Heart Failure Chronic Diastolic Congestive Heart  Failure Chronic Systolic & Diastolic Congestive Heart Failure Acute Systolic Congestive Heart Failure Acute Diastolic Congestive Heart Failure Acute Systolic & Diastolic Congestive Heart Failure Acute on Chronic Systolic Congestive Heart Failure Acute on Chronic Diastolic Congestive Heart Failure Acute on Chronic Systolic & Diastolic  Congestive Heart Failure Other Condition________________________________________ Cannot Clinically Determine  Supporting Information  Risk Factors: HTN,  h/o  CKD, Dyslipidemia, h/o CHF   Signs & Symptoms: "increasing shortness of breath"    Diagnostics: ProBnp 2485.0 12/17  Treatment: Lasix IV given enroute by EMS, Lasix 10mg  IV given in ED,  Reviewed:  no additional documentation provided  Thank You,  Leonette Most Addison  Clinical Documentation Specialist RN, BSN:  Pager [phone]  Health Information Management 

## 2011-12-02 NOTE — Progress Notes (Signed)
Name: [name] MRN: [id number] DOB: 03-16-1922    LOS: 1  PCCM PROGRESS NOTE  History of Present Illness: 77 y/o WF with COPD and CHF recently admitted for pneumonia and discharged to nursing home brought to [facility] ED with worsening nonproductive cough, lower extremities swelling and respiratory difficulty.  Found to be in hypercarbic respiratory failure, placed on BiPAP.  Lines / Drains: 12/17  Foley  Cultures: 12/17  BC>>>  Antibiotics: 12/17  Vancomycin(HCAP)>>>12/18 12/17  Zosyn(HCAP)>>>12/18 1218  Moxi (AECOPD)>>>  Tests / Events: 12/17  Admitted for hypercarbic respiratory failure.  BiPAP started. 12/18  Off BiPAP, reports feeling better  Vital Signs: Temp:  [97.6 F (36.4 C)-98.9 F (37.2 C)] 98.3 F (36.8 C) (12/18 0800) Pulse Rate:  [69-84] 80  (12/18 0800) Resp:  [16-24] 24  (12/18 0800) BP: (121-168)/(56-77) 137/63 mmHg (12/18 0800) SpO2:  [92 %-100 %] 94 % (12/18 0841) FiO2 (%):  [40 %-60 %] 40 % (12/17 1734) Weight:  [86.183 kg (190 lb)-87.7 kg (193 lb 5.5 oz)] 192 lb 3.9 oz (87.2 kg) (12/18 0400) I/O last 3 completed shifts: In: 30 [I.V.:30] Out: 2025 [Urine:2025]  Physical Examination: General:  Comfortable, in no distress Neuro:  Awake, alert, nonfocal   HEENT:  PERRL Neck:  Cannot assess JVD   Cardiovascular:  No murmurs Lungs:  Decreased breath sounds, expiratory wheezes Abdomen:  Soft, nontender, bowel sounds present Musculoskeletal:  Improved pitting LE edema Skin:  No rash  Ventilator settings: Vent Mode:  [-]  FiO2 (%):  [40 %-60 %] 40 %  Labs and Imaging:  Reviewed.  Please refer to the Assessment and Plan section for relevant results.  Assessment and Plan:  COPD exacerbation -->continue bronchodilators -->continue Solu-Medrol 80 bid  CHF exacerbation  Lab 12/02/11 0330 12/01/11 1043  NA 133* 134*    Lab 12/02/11 0330 12/01/11 2106 12/01/11 1043  CREATININE 0.88 1.01 1.01   -->continue Lasix IV 40 bid -->Is/Os  Suspected  pneumonia (HCAP), but afebrile, WBC wnl, PCT>>>0.33, CXR today>>>improved bilateral densities (after diuresis).  Lab 12/02/11 0330 12/01/11 2106 12/01/11 1043  WBC 5.8 6.3 10.7*   -->d/c Vancomycin / Zosyn for pneumonia -->start Avelox for COPD exacerbation -->repeat CXR in AM  Acute hypercarbic respiratory failure secondary to above  Lab 12/01/11 1746 12/01/11 1332 12/01/11 1101  PHART 7.373 7.303* 7.295*  PCO2ART 59.7* 70.4* 70.2*  PO2ART 103.0* 102.0* 114.0*  HCO3 34.7* 34.8* 34.2*  TCO2 36 37 36  O2SAT 98.0 97.0 98.0   -->BiPAP PRN  Atrial flutter / HTN -->continue Coumadin -->Verapamil -->Norvasc  Hypothyroidism -->continue Levothyroxine  Dementia / depression -->Celexa, Aricept  Dyslipidemia -->Simvastatin  Best practices / Disposition -->SDU status under PCCM -->DNR/DNI -->DVT Px is not indicated (Coumdain) -->GI Px is not indicated -->Diet advanced as tolerated -->out of bed -->family updated at bedside  Orlean Bradford, M.D. Pulmonary and Critical Care Medicine [facility] Cell: [phone] Pager: [phone]  12/02/2011, 10:55 AM

## 2011-12-03 ENCOUNTER — Inpatient Hospital Stay (HOSPITAL_COMMUNITY): Payer: Medicare Other

## 2011-12-03 LAB — CBC
Hemoglobin: 10 g/dL — ABNORMAL LOW (ref 12.0–15.0)
MCHC: 32.5 g/dL (ref 30.0–36.0)
Platelets: 164 10*3/uL (ref 150–400)
RBC: 3.33 MIL/uL — ABNORMAL LOW (ref 3.87–5.11)

## 2011-12-03 LAB — BASIC METABOLIC PANEL
CO2: 35 mEq/L — ABNORMAL HIGH (ref 19–32)
GFR calc non Af Amer: 42 mL/min — ABNORMAL LOW (ref >90)
Glucose, Bld: 152 mg/dL — ABNORMAL HIGH (ref 70–99)
Potassium: 3.1 mEq/L — ABNORMAL LOW (ref 3.5–5.1)
Sodium: 134 mEq/L — ABNORMAL LOW (ref 135–145)

## 2011-12-03 LAB — MAGNESIUM: Magnesium: 1.8 mg/dL (ref 1.5–2.5)

## 2011-12-03 LAB — PROTIME-INR
INR: 3.37 — ABNORMAL HIGH (ref 0.00–1.49)
Prothrombin Time: 34.6 seconds — ABNORMAL HIGH (ref 11.6–15.2)

## 2011-12-03 LAB — PHOSPHORUS: Phosphorus: 2.8 mg/dL (ref 2.3–4.6)

## 2011-12-03 MED ORDER — FUROSEMIDE 10 MG/ML IJ SOLN
40.0000 mg | Freq: Once | INTRAMUSCULAR | Status: AC
  Administered 2011-12-03: 40 mg via INTRAVENOUS
  Filled 2011-12-03: qty 4

## 2011-12-03 MED ORDER — PREDNISONE 20 MG PO TABS
40.0000 mg | ORAL_TABLET | Freq: Every day | ORAL | Status: DC
  Administered 2011-12-04: 40 mg via ORAL
  Filled 2011-12-03 (×2): qty 2

## 2011-12-03 MED ORDER — POTASSIUM CHLORIDE CRYS ER 20 MEQ PO TBCR
40.0000 meq | EXTENDED_RELEASE_TABLET | Freq: Once | ORAL | Status: AC
  Administered 2011-12-03: 40 meq via ORAL
  Filled 2011-12-03: qty 2

## 2011-12-03 MED ORDER — ALBUTEROL SULFATE (5 MG/ML) 0.5% IN NEBU
2.5000 mg | INHALATION_SOLUTION | Freq: Three times a day (TID) | RESPIRATORY_TRACT | Status: DC
  Administered 2011-12-03 – 2011-12-08 (×16): 2.5 mg via RESPIRATORY_TRACT
  Filled 2011-12-03 (×14): qty 0.5

## 2011-12-03 MED ORDER — WARFARIN 0.5 MG HALF TABLET
0.5000 mg | ORAL_TABLET | Freq: Once | ORAL | Status: AC
  Administered 2011-12-03: 0.5 mg via ORAL
  Filled 2011-12-03: qty 1

## 2011-12-03 MED ORDER — ALBUTEROL SULFATE (5 MG/ML) 0.5% IN NEBU
2.5000 mg | INHALATION_SOLUTION | RESPIRATORY_TRACT | Status: DC | PRN
  Administered 2011-12-03 – 2011-12-04 (×2): 2.5 mg via RESPIRATORY_TRACT
  Filled 2011-12-03 (×4): qty 0.5

## 2011-12-03 MED ORDER — SODIUM CHLORIDE 0.9 % IJ SOLN
3.0000 mL | Freq: Two times a day (BID) | INTRAMUSCULAR | Status: DC
  Administered 2011-12-03 – 2011-12-10 (×15): 3 mL via INTRAVENOUS
  Administered 2011-12-11 (×2): via INTRAVENOUS

## 2011-12-03 MED ORDER — BIOTENE DRY MOUTH MT LIQD
15.0000 mL | Freq: Two times a day (BID) | OROMUCOSAL | Status: DC
  Administered 2011-12-03 – 2011-12-11 (×15): 15 mL via OROMUCOSAL

## 2011-12-03 MED ORDER — CHLORHEXIDINE GLUCONATE 0.12 % MT SOLN
15.0000 mL | Freq: Two times a day (BID) | OROMUCOSAL | Status: DC
  Administered 2011-12-03 – 2011-12-12 (×19): 15 mL via OROMUCOSAL
  Filled 2011-12-03 (×21): qty 15

## 2011-12-03 NOTE — Progress Notes (Signed)
eLink Physician-Brief Progress Note Patient Name: [name] DOB: January 21, 1922 MRN: [id number]  Date of Service  12/03/2011   HPI/Events of Note   Hypokalemia  eICU Interventions  Potassium replaced   Intervention Category Intermediate Interventions: Electrolyte abnormality - evaluation and management  DETERDING,ELIZABETH 12/03/2011, 6:53 AM

## 2011-12-03 NOTE — Progress Notes (Signed)
At present have stopped IV fluids, pt. Continues w/exp. wheezws as well as crackle thru out

## 2011-12-03 NOTE — Progress Notes (Signed)
Physical Therapy Evaluation PT Assessment Clinical Impression Statement: Pt presents to PT with COPD exacerbation with mobility limited by compromised cardiopulmonary system and impulsivity.  Will benefit from PT to maximize safety with mobility to prepare for ultimate D/C home. PT Recommendation/Assessment: Patient will need skilled PT in the acute care venue PT Problem List: Decreased activity tolerance;Decreased mobility;Decreased cognition;Decreased knowledge of use of DME;Decreased safety awareness;Cardiopulmonary status limiting activity Barriers to Discharge: Decreased caregiver support PT Plan PT Frequency: Min 2X/week PT Treatment/Interventions: DME instruction;Gait training;Functional mobility training;Therapeutic activities;Therapeutic exercise;Cognitive remediation;Patient/family education PT Recommendation Follow Up Recommendations: Skilled nursing facility Equipment Recommended: Defer to next venue  Patient Details Name: [name] MRN: [id number] DOB: 1922/01/07 Today's Date: 12/03/2011  Problem List:  Patient Active Problem List  Diagnoses  . ADENOCARCINOMA, BREAST, RIGHT  . HYPERLIPIDEMIA  . HYPERTENSION  . C O P D  . Atrial fib/flutter  . Peripheral edema  . Abnormal chest CT  . Bradycardia  . Acute renal failure  . Acute respiratory failure  . CAP (community acquired pneumonia)    Past Medical History:  Past Medical History  Diagnosis Date  . COPD (chronic obstructive pulmonary disease)   . Hyperlipidemia   . Hypertension   . Osteoporosis   . Hypothyroid   . Memory loss   . Anemia     Presumed GI on iron replacement hemoglobin in the 10 range  . Atrial flutter     Coumadin initiated 7/12 and followed by PCP; patient felt to be high risk for RFCA secondary to COPD;  echo was done 7/30:  EF 50% to 55%. Wall motion was normal ;there were no regional wall motion abnormalities, mild AS, MAC, mild LAE, mild RAE, PASP 35, mild pulmonary HTN  . On home O2     . Anxiety   . Depression   . CHF (congestive heart failure)   . Emphysema   . Chronic kidney disease   . Breast cancer   . DEMENTIA 11/04/11    "she has some significant dementia"   Past Surgical History:  Past Surgical History  Procedure Date  . Tonsillectomy   . Appendectomy   . Abdominal hysterectomy   . Mastectomy 1990    right  . Cataract extraction     "don't know if both eyes or if she had lenses put in"    PT Goals  Acute Rehab PT Goals PT Goal Formulation: With patient Time For Goal Achievement: 2 weeks Pt will go Supine/Side to Sit: with supervision;with HOB 0 degrees PT Goal: Supine/Side to Sit - Progress: Not met Pt will go Sit to Supine/Side: with supervision;with HOB 0 degrees PT Goal: Sit to Supine/Side - Progress: Not met Pt will go Sit to Stand: with supervision PT Goal: Sit to Stand - Progress: Not met Pt will go Stand to Sit: with supervision PT Goal: Stand to Sit - Progress: Not met Pt will Ambulate: 16 - 50 feet;with supervision;with least restrictive assistive device PT Goal: Ambulate - Progress: Not met Pt will Perform Home Exercise Program: with supervision, verbal cues required/provided PT Goal: Perform Home Exercise Program - Progress: Not met  PT Evaluation Precautions/Restrictions  Precautions Precautions: Fall Prior Functioning  Home Living Lives With: Other (Comment) (recently discharged to SNF for rehab due to lives alone) Prior Function Level of Independence: Needs assistance with ADLs;Needs assistance with gait Cognition Cognition Arousal/Alertness: Awake/alert Overall Cognitive Status: Impaired Memory: Appears impaired Memory Deficits: denied ever going to a SNF and reports she went home alone  after last admission Orientation Level: Oriented to person;Oriented to place (time not tested) Awareness of Deficits: Decreased awareness of deficits Awareness of Deficits - Other Comments: Encouraged to pace herself and yet pt began  walking quickly and quickly fatigued with decr SaO2 and requested to sit down Sensation/Coordination Sensation Light Touch: Appears Intact (bil legs) Extremity Assessment RLE Assessment RLE Assessment: Within Functional Limits LLE Assessment LLE Assessment: Within Functional Limits Mobility (including Balance) Bed Mobility Bed Mobility: No Transfers Sit to Stand: 4: Min assist;With upper extremity assist;From chair/3-in-1 Sit to Stand Details (indicate cue type and reason): vc for safest hand placement with RW; assisted to stabilize RW as pt pulled up on one side of RW with transfer Stand to Sit: 4: Min assist;With upper extremity assist;To chair/3-in-1 Stand to Sit Details: chair brought to pt due to fatigue and decr SaO2 Ambulation/Gait Ambulation/Gait Assistance: 1: +2 Total assist;Patient percentage (comment) Ambulation/Gait Assistance Details (indicate cue type and reason): vc to keep RW closer to body (for upright posture and safety); pt moves quickly, impulsively; pt=85%; 2nd person for lines and to bring chair to pt Ambulation Distance (Feet): 12 Feet Assistive device: Rolling walker Gait Pattern: Trunk flexed    Exercise    End of Session PT - End of Session Equipment Utilized During Treatment: Gait belt Activity Tolerance: Patient limited by fatigue;Treatment limited secondary to medical complications (Comment) (limited by decr SaO2 and dyspnea) Patient left: in chair;with call bell in reach Nurse Communication: Mobility status for ambulation General Behavior During Session: [facility] for tasks performed  Debi Cousin 12/03/2011, 3:09 PM  12/03/2011 Veda Canning, PT Pager: [phone]

## 2011-12-03 NOTE — Progress Notes (Signed)
PHARMACY - ANTICOAGULATION CONSULT FOLLOW-UP NOTE  Pharmacy Consult for:  Coumadin  Indication: Atrial flutter   Patient Data:   Allergies: Allergies  Allergen Reactions  . Codeine     REACTION: nausea    Patient Measurements: Height: 5\' 4"  (162.6 cm) Weight: 195 lb 1.7 oz (88.5 kg) IBW/kg (Calculated) : 54.7  Adjusted Body Weight:  Vital Signs: Temp:  [98 F (36.7 C)-99.1 F (37.3 C)] 98 F (36.7 C) (12/19 0800) Pulse Rate:  [72-102] 81  (12/19 0905) Resp:  [19-27] 25  (12/19 0905) BP: (119-141)/(45-77) 141/72 mmHg (12/19 0800) SpO2:  [92 %-95 %] 94 % (12/19 0905) Weight:  [195 lb 1.7 oz (88.5 kg)] 195 lb 1.7 oz (88.5 kg) (12/19 0500)  Intake/Output from previous day:  Intake/Output Summary (Last 24 hours) at 12/03/11 0911 Last data filed at 12/03/11 0800  Gross per 24 hour  Intake   2120 ml  Output   1400 ml  Net    720 ml    Labs:  Basename 12/03/11 0505 12/02/11 1557 12/02/11 0924 12/02/11 0330 12/02/11 0022 12/01/11 2106  HGB 10.0* -- -- 10.4* -- --  HCT 30.8* -- -- 32.7* -- 30.3*  PLT 164 -- -- 149* -- 139*  APTT -- -- -- -- -- --  LABPROT 34.6* -- -- 37.1* -- 40.2*  INR 3.37* -- -- 3.68* -- 4.08*  HEPARINUNFRC -- -- -- -- -- --  CREATININE 1.14* -- -- 0.88 -- 1.01  CKTOTAL -- 32 34 -- 40 --  CKMB -- 2.3 3.0 -- 2.6 --  TROPONINI -- <0.30 <0.30 -- <0.30 --   Estimated Creatinine Clearance: 37.4 ml/min (by C-G formula based on Cr of 1.14).  Scheduled Medications:     . albuterol  2.5 mg Nebulization TID  . amLODipine  5 mg Oral Daily  . antiseptic oral rinse  15 mL Mouth Rinse q12n4p  . chlorhexidine  15 mL Mouth Rinse BID  . citalopram  20 mg Oral Daily  . donepezil  10 mg Oral QHS  . ferrous sulfate  325 mg Oral Q breakfast  . furosemide  40 mg Intravenous BID  . levothyroxine  100 mcg Oral QAC breakfast  . methylPREDNISolone (SOLU-MEDROL) injection  80 mg Intravenous Q12H  . moxifloxacin  400 mg Intravenous Q24H  . potassium chloride  40  mEq Oral Once  . senna  2 tablet Oral QHS  . simvastatin  20 mg Oral q1800  . tiotropium  18 mcg Inhalation Daily  . verapamil  180 mg Oral BID  . white petrolatum      . DISCONTD: albuterol  2.5 mg Nebulization Q4H  . DISCONTD: ipratropium  0.5 mg Nebulization Q4H  . DISCONTD: piperacillin-tazobactam (ZOSYN)  IV  3.375 g Intravenous Q8H  . DISCONTD: polyethylene glycol  17 g Oral Daily  . DISCONTD: vancomycin  1,250 mg Intravenous Q24H     Assessment:  77 y.o. female was admitted on 12/01/2011 with h/o atrial flutter. INR is above-goal, but trending down. PTA, patient was on Coumadin 7mg  daily with last dose 12/16.   Goal of Therapy:  1. INR 2-3   Plan:  1. Coumadin 0.5mg  po today.  2. Follow-up AM labs.    Dineen Kid Thad Ranger, PharmD 12/03/2011, 9:11 AM

## 2011-12-03 NOTE — Progress Notes (Signed)
Name: [name] MRN: [id number] DOB: 1922-06-27    LOS: 2  PCCM PROGRESS NOTE  History of Present Illness: 77 y/o WF with COPD and CHF recently admitted for pneumonia and discharged to nursing home brought to [facility] ED with worsening nonproductive cough, lower extremities swelling and respiratory difficulty.  Found to be in hypercarbic respiratory failure, placed on BiPAP.  Lines / Drains: 12/17  Foley  Cultures: 12/17  BC>>>  Antibiotics: 12/17  Vancomycin(HCAP)>>>12/18 12/17  Zosyn(HCAP)>>>12/18 1218  Moxi (AECOPD)>>>  Tests / Events: 12/17  Admitted for hypercarbic respiratory failure.  BiPAP started. 12/18  Off BiPAP, reports feeling better 12/19  No acute events.  Reports feeling better.  Denies dyspnea.  Vital Signs: Temp:  [98 F (36.7 C)-99.1 F (37.3 C)] 98 F (36.7 C) (12/19 0800) Pulse Rate:  [72-102] 81  (12/19 0905) Resp:  [19-27] 25  (12/19 0905) BP: (119-141)/(45-77) 141/72 mmHg (12/19 0800) SpO2:  [92 %-95 %] 94 % (12/19 0905) Weight:  [88.5 kg (195 lb 1.7 oz)] 195 lb 1.7 oz (88.5 kg) (12/19 0500) I/O last 3 completed shifts: In: 2370 [P.O.:1160; I.V.:1210] Out: 2150 [Urine:2150]  Physical Examination: General:  Comfortable, in no distress Neuro:  Somewhat confused, but reoriented easily   HEENT:  PERRL Neck:  Cannot assess JVD   Cardiovascular:  RRR, no murmurs Lungs:  Few rales / expiratory wheezes Abdomen:  Soft, nontender, bowel sounds present Musculoskeletal:  Improved pitting LE edema Skin:  No rash  Ventilator settings:    Labs and Imaging:  Reviewed.  Please refer to the Assessment and Plan section for relevant results.  Assessment and Plan:  COPD exacerbation, pneumonia ruled out.  No significant changes in CXR today.  Lab 12/03/11 0505 12/02/11 0330 12/01/11 2106 12/01/11 1043  WBC 7.9 5.8 6.3 10.7*   -->continue bronchodilators -->change Solu-Medrol to Prednisone 40 mg PO daily -->Avelox  CHF exacerbation  Lab 12/03/11 0505  12/02/11 0330 12/01/11 1043  NA 134* 133* 134*    Lab 12/03/11 0505 12/02/11 0330 12/01/11 2106 12/01/11 1043  CREATININE 1.14* 0.88 1.01 1.01   -->decrease Lasix to 40 daily as Cr increased -->saline lock IV -->Is/Os  Acute hypercarbic respiratory failure secondary to above, resolved  Lab 12/01/11 1746 12/01/11 1332 12/01/11 1101  PHART 7.373 7.303* 7.295*  PCO2ART 59.7* 70.4* 70.2*  PO2ART 103.0* 102.0* 114.0*  HCO3 34.7* 34.8* 34.2*  TCO2 36 37 36  O2SAT 98.0 97.0 98.0   -->d/c BiPAP  Hypokalemia, secondary to diuretic  Lab 12/03/11 0505 12/02/11 0330 12/01/11 1043  K 3.1* 4.0 4.1   -->replaced  Atrial flutter / HTN -->continue Coumadin -->Verapamil -->Norvasc  Hypothyroidism -->continue Levothyroxine  Dementia / depression -->Celexa, Aricept  Dyslipidemia -->Simvastatin  Best practices / Disposition -->downgrade to floor status under PCCM -->DNR/DNI -->DVT Px is not indicated (Coumdain) -->GI Px is not indicated -->Diet -->out of bed -->keep Foley for now -->PT/OT  Orlean Bradford, M.D. Pulmonary and Critical Care Medicine [facility] Cell: [phone] Pager: [phone]  12/03/2011, 10:51 AM

## 2011-12-03 NOTE — Progress Notes (Signed)
Pt. Being transferred to [id number] via wheelchair. Phone report called to Mindy,rn. Pt. Aware of transfer.

## 2011-12-03 NOTE — Progress Notes (Signed)
eLink Physician-Brief Progress Note Patient Name: [name] DOB: 01/02/1922 MRN: [id number]  Date of Service  12/03/2011   HPI/Events of Note  C/o of SOB/wheezing requesting prn breathing treatment   eICU Interventions  Plan: Prn albuterol q2 hours prn SOB   Intervention Category Intermediate Interventions: Respiratory distress - evaluation and management  DETERDING,ELIZABETH 12/03/2011, 4:45 AM

## 2011-12-04 DIAGNOSIS — J9600 Acute respiratory failure, unspecified whether with hypoxia or hypercapnia: Secondary | ICD-10-CM

## 2011-12-04 DIAGNOSIS — J441 Chronic obstructive pulmonary disease with (acute) exacerbation: Secondary | ICD-10-CM

## 2011-12-04 DIAGNOSIS — J158 Pneumonia due to other specified bacteria: Secondary | ICD-10-CM

## 2011-12-04 DIAGNOSIS — I509 Heart failure, unspecified: Secondary | ICD-10-CM

## 2011-12-04 LAB — BASIC METABOLIC PANEL
CO2: 32 mEq/L (ref 19–32)
GFR calc non Af Amer: 37 mL/min — ABNORMAL LOW (ref >90)
Glucose, Bld: 136 mg/dL — ABNORMAL HIGH (ref 70–99)
Potassium: 3.5 mEq/L (ref 3.5–5.1)
Sodium: 136 mEq/L (ref 135–145)

## 2011-12-04 LAB — PROTIME-INR
INR: 3 — ABNORMAL HIGH (ref 0.00–1.49)
Prothrombin Time: 31.6 seconds — ABNORMAL HIGH (ref 11.6–15.2)

## 2011-12-04 LAB — CBC
Hemoglobin: 10.2 g/dL — ABNORMAL LOW (ref 12.0–15.0)
RBC: 3.35 MIL/uL — ABNORMAL LOW (ref 3.87–5.11)

## 2011-12-04 LAB — PHOSPHORUS: Phosphorus: 2.9 mg/dL (ref 2.3–4.6)

## 2011-12-04 MED ORDER — WARFARIN SODIUM 3 MG PO TABS
3.0000 mg | ORAL_TABLET | Freq: Once | ORAL | Status: AC
  Administered 2011-12-04: 3 mg via ORAL
  Filled 2011-12-04 (×2): qty 1

## 2011-12-04 MED ORDER — POTASSIUM CHLORIDE CRYS ER 20 MEQ PO TBCR
20.0000 meq | EXTENDED_RELEASE_TABLET | Freq: Once | ORAL | Status: AC
  Administered 2011-12-04: 20 meq via ORAL
  Filled 2011-12-04: qty 1

## 2011-12-04 MED ORDER — FUROSEMIDE 10 MG/ML IJ SOLN
40.0000 mg | Freq: Once | INTRAMUSCULAR | Status: AC
  Administered 2011-12-04: 40 mg via INTRAVENOUS
  Filled 2011-12-04: qty 4

## 2011-12-04 MED ORDER — PREDNISONE 20 MG PO TABS
40.0000 mg | ORAL_TABLET | Freq: Two times a day (BID) | ORAL | Status: DC
  Administered 2011-12-04 – 2011-12-07 (×5): 40 mg via ORAL
  Filled 2011-12-04 (×7): qty 2

## 2011-12-04 MED ORDER — MOXIFLOXACIN HCL 400 MG PO TABS
400.0000 mg | ORAL_TABLET | Freq: Every day | ORAL | Status: DC
  Administered 2011-12-04 – 2011-12-06 (×3): 400 mg via ORAL
  Filled 2011-12-04 (×5): qty 1

## 2011-12-04 NOTE — Progress Notes (Signed)
Clinical Social Worker completed psychosocial assessment, please see shadow chart for details.  Of note: family stated that they were interested in pt returning home with home health PT, if possible.  CSW signing off at this time.  Please consult CSW if plan if for SNF and/or pt not medically stable to dc home with home health PT.   Angelia Mould, MSW, [place] [phone]

## 2011-12-04 NOTE — Progress Notes (Signed)
Order received, chart reviewed, pt from SNF and plans are for patient to return to SNF post D/C. Pt with Medicare so should not need OT eval to return back to SNF. WIll defer OT eval back to SNF. If D/C plan changes will eval as appropriate.  Ignacia Palma, [place] [phone] 12/04/2011

## 2011-12-04 NOTE — Progress Notes (Signed)
Name: [name] MRN: [id number] DOB: 11-15-1922    LOS: 3  PCCM PROGRESS NOTE  History of Present Illness: 77 y/o WF with COPD and CHF recently admitted for pneumonia and discharged to nursing home brought to [facility] ED with worsening nonproductive cough, lower extremities swelling and respiratory difficulty.  Found to be in hypercarbic respiratory failure, placed on BiPAP.  Lines / Drains: 12/17  Foley  Cultures: 12/17  BC>>>ng  Antibiotics: 12/17  Vancomycin(HCAP)>>>12/18 12/17  Zosyn(HCAP)>>>12/18 1218  Moxi (AECOPD)>>>  Tests / Events: 12/17  Admitted for hypercarbic respiratory failure.  BiPAP started. 12/18  Off BiPAP, reports feeling better    No acute events.  Reports feeling better.  Denies dyspnea.  Vital Signs: Temp:  [97.5 F (36.4 C)-98.3 F (36.8 C)] 97.6 F (36.4 C) (12/20 0533) Pulse Rate:  [67-81] 67  (12/20 0533) Resp:  [16-29] 16  (12/20 0533) BP: (124-128)/(54-75) 125/57 mmHg (12/20 0533) SpO2:  [82 %-97 %] 91 % (12/20 0533) Weight:  [88.7 kg (195 lb 8.8 oz)] 195 lb 8.8 oz (88.7 kg) (12/20 0500) I/O last 3 completed shifts: In: 1680 [P.O.:1440; I.V.:240] Out: 1430 [Urine:1430]  Physical Examination: General:  Comfortable, in no distress Neuro:  Somewhat confused, but reoriented easily   HEENT:  PERRL Neck:  Cannot assess JVD   Cardiovascular:  RRR, no murmurs Lungs:  Few rales /diffuse  expiratory wheezes Abdomen:  Soft, nontender, bowel sounds present Musculoskeletal:  Improved pitting LE edema Skin:  No rash     Labs and Imaging:  Reviewed.  Please refer to the Assessment and Plan section for relevant results.  Assessment and Plan:  COPD exacerbation, pneumonia ruled out.  No significant changes in CXR today.  Lab 12/04/11 0553 12/03/11 0505 12/02/11 0330 12/01/11 2106 12/01/11 1043  WBC 11.3* 7.9 5.8 6.3 10.7*   -->continue bronchodilators -->Increase Prednisone 40 mg PO bid  -->Avelox PO  CHF exacerbation  Lab 12/04/11 0553  12/03/11 0505 12/02/11 0330 12/01/11 1043  NA 136 134* 133* 134*    Lab 12/04/11 0553 12/03/11 0505 12/02/11 0330 12/01/11 2106 12/01/11 1043  CREATININE 1.27* 1.14* 0.88 1.01 1.01   -->decrease Lasix to 40 daily, monitor Cr  -->saline lock IV -->Is/Os  Acute hypercarbic respiratory failure secondary to above, resolved  Lab 12/01/11 1746 12/01/11 1332 12/01/11 1101  PHART 7.373 7.303* 7.295*  PCO2ART 59.7* 70.4* 70.2*  PO2ART 103.0* 102.0* 114.0*  HCO3 34.7* 34.8* 34.2*  TCO2 36 37 36  O2SAT 98.0 97.0 98.0   -->d/c BiPAP  Hypokalemia, secondary to diuretic  Lab 12/04/11 0553 12/03/11 0505 12/02/11 0330 12/01/11 1043  K 3.5 3.1* 4.0 4.1   -->replaced  Atrial flutter / HTN -->continue Coumadin -->Verapamil -->Norvasc  Hypothyroidism -->continue Levothyroxine  Dementia / depression -->Celexa, Aricept  Dyslipidemia -->Simvastatin  Deconditioning  - PT recommends SNF  Best practices / Disposition -->downgrade to floor status under PCCM -->DNR/DNI -->DVT Px is not indicated (Coumdain) -->Diet -->out of bed -->keep Foley for now   [facility] V. [phone] 12/04/2011, 8:35 AM

## 2011-12-04 NOTE — Progress Notes (Signed)
ANTICOAGULATION CONSULT NOTE - Follow Up Consult  Pharmacy Consult for Coumadin Indication: aflutter  Allergies  Allergen Reactions  . Codeine     REACTION: nausea    Vital Signs: Temp: 97.6 F (36.4 C) (12/20 0533) Temp src: Oral (12/20 0533) BP: 125/57 mmHg (12/20 0533) Pulse Rate: 67  (12/20 0533)  Labs:  Basename 12/04/11 0553 12/03/11 0505 12/02/11 1557 12/02/11 0924 12/02/11 0330 12/02/11 0022  HGB 10.2* 10.0* -- -- -- --  HCT 31.7* 30.8* -- -- 32.7* --  PLT 165 164 -- -- 149* --  APTT -- -- -- -- -- --  LABPROT 31.6* 34.6* -- -- 37.1* --  INR 3.00* 3.37* -- -- 3.68* --  HEPARINUNFRC -- -- -- -- -- --  CREATININE 1.27* 1.14* -- -- 0.88 --  CKTOTAL -- -- 32 34 -- 40  CKMB -- -- 2.3 3.0 -- 2.6  TROPONINI -- -- <0.30 <0.30 -- <0.30   Estimated Creatinine Clearance: 33.6 ml/min (by C-G formula based on Cr of 1.27).   Assessment: Admit Complaint: PNA Pharmacy System-Based Medication Review: Anticoagulation: On coumadin for aflutter. INR = 3 today after holding x2 days and 0.5mg  yesterday. Coumadin 3mg  x1 today Infectious Disease: PNA - D4 Avelox. Remains afebrile. Blood cultures are neg. Neurology: A little confuse. Depression/dementia - celexa, aricept Pulmonary: 89% 4L albuterol, spiriva, prednisone Cardiovascular:HTN, lipidemia, afib/flutter - 125/57 HR 67 Norvasc, zocor, verapamil Gastrointestinal:N/a Endocrinology: Hypothyroidism - synthroid . CBGs well controlled. Fluids/Electrolytes/Nutrition: Heart healthy. Nephrology: Scr 1.27 I/O +325 Hematology/Oncology: H/o breast CA Best Practices: Coumadin PTA Medication Problems: Goal of Therapy:   INR 2-3   Plan:  1. Coumadin 3mg  today  Clide Cliff 12/04/2011,9:38 AM

## 2011-12-05 ENCOUNTER — Inpatient Hospital Stay (HOSPITAL_COMMUNITY): Payer: Medicare Other

## 2011-12-05 LAB — BASIC METABOLIC PANEL
BUN: 33 mg/dL — ABNORMAL HIGH (ref 6–23)
Calcium: 9.1 mg/dL (ref 8.4–10.5)
GFR calc Af Amer: 37 mL/min — ABNORMAL LOW (ref >90)
GFR calc non Af Amer: 32 mL/min — ABNORMAL LOW (ref >90)
Potassium: 4.2 mEq/L (ref 3.5–5.1)
Sodium: 136 mEq/L (ref 135–145)

## 2011-12-05 LAB — CBC
Hemoglobin: 10.5 g/dL — ABNORMAL LOW (ref 12.0–15.0)
MCHC: 32 g/dL (ref 30.0–36.0)
Platelets: 175 10*3/uL (ref 150–400)

## 2011-12-05 LAB — PROTIME-INR: Prothrombin Time: 27.6 seconds — ABNORMAL HIGH (ref 11.6–15.2)

## 2011-12-05 MED ORDER — WARFARIN SODIUM 6 MG PO TABS
7.0000 mg | ORAL_TABLET | Freq: Once | ORAL | Status: AC
  Administered 2011-12-05: 7 mg via ORAL
  Filled 2011-12-05: qty 1

## 2011-12-05 MED ORDER — ATORVASTATIN CALCIUM 10 MG PO TABS
20.0000 mg | ORAL_TABLET | Freq: Every day | ORAL | Status: DC
  Administered 2011-12-05 – 2011-12-11 (×6): 20 mg via ORAL
  Filled 2011-12-05 (×9): qty 2

## 2011-12-05 NOTE — Progress Notes (Signed)
Name: [name] MRN: [id number] DOB: Jul 18, 1922    LOS: 4  PCCM PROGRESS NOTE  History of Present Illness: 77 y/o WF with COPD and CHF recently admitted for pneumonia and discharged to nursing home brought to [facility] ED with worsening nonproductive cough, lower extremities swelling and respiratory difficulty.  Found to be in hypercarbic respiratory failure, placed on BiPAP.  Lines / Drains: 12/17  Foley  Cultures: 12/17  BC>>>ng  Antibiotics: 12/17  Vancomycin(HCAP)>>>12/18 12/17  Zosyn(HCAP)>>>12/18 1218  Moxi (AECOPD)>>>  Tests / Events: 12/17  Admitted for hypercarbic respiratory failure.  BiPAP started. 12/18  Off BiPAP, reports feeling better    No acute events.  Reports feeling better.  Denies dyspnea. 'just get me out of here'  Vital Signs: Temp:  [96.9 F (36.1 C)-97.6 F (36.4 C)] 96.9 F (36.1 C) (12/21 0645) Pulse Rate:  [68] 68  (12/21 0645) Resp:  [20-22] 22  (12/21 0645) BP: (119-149)/(56-69) 149/69 mmHg (12/21 0645) SpO2:  [86 %-96 %] 95 % (12/21 0859) Weight:  [88.8 kg (195 lb 12.3 oz)] 195 lb 12.3 oz (88.8 kg) (12/21 0645) I/O last 3 completed shifts: In: 970 [P.O.:970] Out: 1531 [Urine:1530; Stool:1]  Physical Examination: General:  Comfortable, in no distress Neuro:  Somewhat confused, but reoriented easily   HEENT:  PERRL Neck:  Cannot assess JVD   Cardiovascular:  RRR, no murmurs Lungs:  Few rales /diffuse  expiratory wheezes Abdomen:  Soft, nontender, bowel sounds present Musculoskeletal:  Improved pitting LE edema Skin:  No rash     Labs and Imaging:  Reviewed.  Please refer to the Assessment and Plan section for relevant results.  Assessment and Plan:  COPD exacerbation, pneumonia ruled out.  No significant changes in CXR today.  Lab 12/05/11 0635 12/04/11 0553 12/03/11 0505 12/02/11 0330 12/01/11 2106  WBC 10.0 11.3* 7.9 5.8 6.3   -->continue bronchodilators -->Increased Prednisone 40 mg PO bid - start taper once bspasm  subsides -->Avelox PO  CHF exacerbation  Lab 12/05/11 0635 12/04/11 0553 12/03/11 0505 12/02/11 0330 12/01/11 1043  NA 136 136 134* 133* 134*    Lab 12/05/11 0635 12/04/11 0553 12/03/11 0505 12/02/11 0330 12/01/11 2106  CREATININE 1.44* 1.27* 1.14* 0.88 1.01   -->Hold Lasix -  Cr rising, rechk BNP -->Is/Os  Acute hypercarbic respiratory failure secondary to above, resolved  Lab 12/01/11 1746 12/01/11 1332 12/01/11 1101  PHART 7.373 7.303* 7.295*  PCO2ART 59.7* 70.4* 70.2*  PO2ART 103.0* 102.0* 114.0*  HCO3 34.7* 34.8* 34.2*  TCO2 36 37 36  O2SAT 98.0 97.0 98.0   -->Off BiPAP  Hypokalemia, secondary to diuretic  Lab 12/05/11 0635 12/04/11 0553 12/03/11 0505 12/02/11 0330 12/01/11 1043  K 4.2 3.5 3.1* 4.0 4.1   -->replaced  Atrial flutter / HTN -->continue Coumadin -->Verapamil -->Norvasc  Hypothyroidism -->continue Levothyroxine  Dementia / depression -->Celexa, Aricept  Dyslipidemia -->Simvastatin  Deconditioning  - PT recommends back to SNF, Family prefers home with PT >> will see what progress she makes over weekend  Best practices / Disposition --> floor status under PCCM -->DNR/DNI -->DVT Px is not indicated (Coumdain) -->Diet -->out of bed -->OK to dc  Foley    ALVA,RAKESH V. [phone] 12/05/2011, 10:22 AM

## 2011-12-05 NOTE — Progress Notes (Signed)
ANTICOAGULATION CONSULT NOTE - Follow Up Consult  Pharmacy Consult:  Coumadin Indication:  Aflutter  Allergies  Allergen Reactions  . Codeine     REACTION: nausea    Patient Measurements: Height: 5\' 4"  (162.6 cm) Weight: 195 lb 12.3 oz (88.8 kg) IBW/kg (Calculated) : 54.7   Vital Signs: Temp: 96.9 F (36.1 C) (12/21 0645) Temp src: Oral (12/21 0645) BP: 149/69 mmHg (12/21 0645) Pulse Rate: 68  (12/21 0645)  Labs:  Basename 12/05/11 4098 12/04/11 0553 12/03/11 0505 12/02/11 1557 12/02/11 0924  HGB 10.5* 10.2* -- -- --  HCT 32.8* 31.7* 30.8* -- --  PLT 175 165 164 -- --  APTT -- -- -- -- --  LABPROT 27.6* 31.6* 34.6* -- --  INR 2.52* 3.00* 3.37* -- --  HEPARINUNFRC -- -- -- -- --  CREATININE -- 1.27* 1.14* -- --  CKTOTAL -- -- -- 32 34  CKMB -- -- -- 2.3 3.0  TROPONINI -- -- -- <0.30 <0.30   Estimated Creatinine Clearance: 33.6 ml/min (by C-G formula based on Cr of 1.27).   Admit Complaint: PNA Pharmacy System-Based Medication Review: ANTICOAG: Coumadin for aflutter. INR down 2.52, therapeutic.  No bleeding noted. ID: PNA - D#5 Avelox PO. Remains afebrile. Blood cultures are neg, WBC WNL NEURO: A little confuse. Depression/dementia - celexa, aricept PULM: 96% 4L, on albuterol, spiriva, prednisone CARDS:  HTN, lipidemia, afib/flutter - 149/69, HR 68 Norvasc, zocor, verapamil GI/NUTRITIONl:  Heart healthy diet ENDO: Hypothyroidism - synthroid - no TSH. CBGs well controlled. RENAL: Scr 1.27 I/O +325 HEMONC: H/o breast CA, H/H/plts stable - on PO iron Best Practices: Coumadin  Home Coumadin dose:  7mg  PO daily.  Plan:   1.  Coumadin 7mg  PO today 2.  Daily INR   Phillips Climes, PharmD 12/05/2011

## 2011-12-05 NOTE — Progress Notes (Signed)
Physical Therapy Treatment Patient Details Name: [name] MRN: [id number] DOB: October 09, 1922 Today's Date: 12/05/2011  PT Assessment/Plan  PT - Assessment/Plan Comments on Treatment Session: PT called to room by RN secondary to family desiring to take pt home. PT discussed pt's level of assist required for mobility, performed transfer to chair. Pre -session pt's SpO2= 93% on 4L [place] O2, post transfer SpO2 = 76% on 4L and pt required 5L [place] O2 to increase SpO2 to 94%, she then was returned to 4L [place] O2 and remained ~93%. RN made aware immediately. PT, pt, and family decided to return to SNF at D/C for short term rehab prior to return home with 1 person 24 hour supervision.   PT Plan: Discharge plan remains appropriate PT Frequency: Min 2X/week Follow Up Recommendations: Skilled nursing facility Equipment Recommended: Defer to next venue PT Goals  Acute Rehab PT Goals PT Goal Formulation: With patient Time For Goal Achievement: 2 weeks Pt will go Supine/Side to Sit: with supervision;with HOB 0 degrees PT Goal: Supine/Side to Sit - Progress: Progressing toward goal Pt will go Sit to Supine/Side: with supervision;with HOB 0 degrees PT Goal: Sit to Supine/Side - Progress: Progressing toward goal Pt will go Sit to Stand: with supervision PT Goal: Sit to Stand - Progress: Progressing toward goal Pt will go Stand to Sit: with supervision PT Goal: Stand to Sit - Progress: Progressing toward goal Pt will Ambulate: 16 - 50 feet;with supervision;with least restrictive assistive device PT Goal: Ambulate - Progress: Progressing toward goal Pt will Perform Home Exercise Program: with supervision, verbal cues required/provided PT Goal: Perform Home Exercise Program - Progress: Progressing toward goal  PT Treatment Precautions/Restrictions  Precautions Precautions: Fall Precaution Comments: Weakness Restrictions Weight Bearing Restrictions: No Mobility (including Balance) Bed Mobility Bed Mobility:  Yes Supine to Sit: 4: Min assist;HOB elevated (Comment degrees) (HOB~40 degrees) Supine to Sit Details (indicate cue type and reason): Verbal cues for efficiency, min assist for LEs.  Transfers Transfers: Yes Sit to Stand: 4: Min assist;From bed Sit to Stand Details (indicate cue type and reason): Verbal cues for sequence, assist for weakness Stand to Sit: To chair/3-in-1;3: Mod assist Stand to Sit Details: Verbal cues for sequence and control of descent Stand Pivot Transfers: 2: Max assist Stand Pivot Transfer Details (indicate cue type and reason): Verbal cues for safety.     End of Session PT - End of Session Equipment Utilized During Treatment: Gait belt Activity Tolerance: Patient limited by fatigue;Treatment limited secondary to medical complications (Comment) (limited by decr SaO2 and dyspnea) Patient left: in chair;with call bell in reach;with family/visitor present Nurse Communication: Mobility status for ambulation General Behavior During Session: [facility] for tasks performed Cognition: Impaired Cognitive Impairment: Decreased memory  Sherie Don 12/05/2011, 1:18 PM  Sherie Don) Carleene Mains PT, DPT Acute Rehabilitation [phone]

## 2011-12-05 NOTE — Progress Notes (Signed)
   CARE MANAGEMENT NOTE 12/05/2011  Patient:  [name], [name]   Account Number:  [id number]  Date Initiated:  12/03/2011  Documentation initiated by:  [facility]  Subjective/Objective Assessment:   resp failure -     Action/Plan:   PTA, PT HAS RESIDED AT [facility] X 2 WEEKS FOR REHAB.   Anticipated DC Date:  12/08/2011   Anticipated DC Plan:  SKILLED NURSING FACILITY  In-house referral  Clinical Social Worker      DC Planning Services  CM consult      Choice offered to / List presented to:             Status of service:  In process, will continue to follow Medicare Important Message given?   (If response is "NO", the following Medicare IM given date fields will be blank) Date Medicare IM given:   Date Additional Medicare IM given:    Discharge Disposition:    Per UR Regulation:  Reviewed for med. necessity/level of care/duration of stay  Comments:  12/05/11 Zenaya Ulatowski,RN,BSN 1330 PHYSICAL THERAPIST IS RECOMMENDING THAT PT RETURN TO [facility] FOR REHAB PRIOR TO RETURNING HOME WITH FAMILY.  SHE IS DIAPPOINTED, BUT WANTS TO DO THE "RIGHT THING" AND UNDERSTANDS.  NOTIFIED DR ALVA OF CHANGE IN DISPOSITION. WILL CONSULT CSW TO NOTIFY FACILITY THAT PT WILL BE RETURNING, PERHAPS MONDAY, IF REMAINS STABLE OVER THE WEEKEND.Phone #[phone]   12/05/11 Lleyton Byers,RN,BSN 1100 MET WITH PT AND FAMILY TO DISCUSS DISCHARGE PLANS.  FAMILY CONSIDERING TAKING PT HOME, AND NOT RETURNING TO SNF AT DISCHARGE, IF RECOMMENDED BY PHYSICAL THERAPIST.  PT WOULD NEED HOME HEALTH RN, PT, AND OT.  SHE IS ON CHRONIC O2, AND HAS HOME O2 PROVIDED BY [facility].  PT ALREADY OWNS RW, BSC, AND TUB BENCH.  SHE WOULD BENEFIT FROM HOSPITAL BED AT DISCHARGE.  PT TO WORK WITH PT SHORTLY.  WILL FOLLOW FOR RECOMMENDATIONS. Phone #[phone]   12-03-11 2:40pm Avie Arenas, RNBSN - [phone] UR completed.

## 2011-12-05 NOTE — Progress Notes (Signed)
I received a phone call from Care Management regarding [name] possibly returning back to [facility]. I had to leave a message with the [facility] admissions coordinator about the possibility of the return. We will follow-up.  Gretta Cool [place], [place] 12/05/2011

## 2011-12-06 LAB — BASIC METABOLIC PANEL
Calcium: 9.2 mg/dL (ref 8.4–10.5)
GFR calc Af Amer: 40 mL/min — ABNORMAL LOW (ref >90)
GFR calc non Af Amer: 35 mL/min — ABNORMAL LOW (ref >90)
Glucose, Bld: 132 mg/dL — ABNORMAL HIGH (ref 70–99)
Potassium: 4.5 mEq/L (ref 3.5–5.1)
Sodium: 136 mEq/L (ref 135–145)

## 2011-12-06 LAB — CBC
Hemoglobin: 10.4 g/dL — ABNORMAL LOW (ref 12.0–15.0)
MCHC: 32.2 g/dL (ref 30.0–36.0)
Platelets: 172 10*3/uL (ref 150–400)
RDW: 14.2 % (ref 11.5–15.5)

## 2011-12-06 LAB — PROTIME-INR
INR: 2.65 — ABNORMAL HIGH (ref 0.00–1.49)
Prothrombin Time: 28.7 seconds — ABNORMAL HIGH (ref 11.6–15.2)

## 2011-12-06 LAB — PRO B NATRIURETIC PEPTIDE: Pro B Natriuretic peptide (BNP): 3549 pg/mL — ABNORMAL HIGH (ref 0–450)

## 2011-12-06 MED ORDER — WARFARIN SODIUM 5 MG PO TABS
5.0000 mg | ORAL_TABLET | Freq: Once | ORAL | Status: AC
  Administered 2011-12-06: 5 mg via ORAL
  Filled 2011-12-06: qty 1

## 2011-12-06 NOTE — Progress Notes (Signed)
Name: [name] MRN: [id number] DOB: 03-Aug-1922    LOS: 5  PCCM PROGRESS NOTE  History of Present Illness: 77 y/o WF with COPD and CHF recently admitted for pneumonia and discharged to nursing home brought to [facility] ED 12/17 with worsening nonproductive cough, lower extremities swelling and respiratory difficulty.  Found to be in hypercarbic respiratory failure, placed on BiPAP/ NCB status     Cultures: 12/17 MRSA screen > neg 12/17  BC>>> 12/17 UC > neg   Antibiotics: 12/17  Vancomycin(HCAP)>>>12/18 12/17  Zosyn(HCAP)>>>12/18 1218  Moxi (AECOPD)>>>  Tests / Events: 12/17  Admitted for hypercarbic respiratory failure.  BiPAP started. 12/18  Off BiPAP, reports feeling better    No acute events.  Reports feeling better.  Denies dyspnea. 'just get me out of here'  Vital Signs: Temp:  [96.3 F (35.7 C)-96.7 F (35.9 C)] 96.7 F (35.9 C) (12/22 0501) Pulse Rate:  [52-71] 71  (12/22 0501) Resp:  [20] 20  (12/22 0501) BP: (117-132)/(59-61) 117/59 mmHg (12/22 0501) SpO2:  [92 %-96 %] 93 % (12/22 1501) I/O last 3 completed shifts: In: 271 [P.O.:270; Other:1] Out: 1225 [Urine:1225]  Physical Examination: General:  Comfortable, in no distress Neuro:  Somewhat confused, but reoriented easily   HEENT:  PERRL Neck:  Cannot assess JVD   Cardiovascular:  RRR, no murmurs Lungs:  Few rales /diffuse  expiratory wheezes Abdomen:  Soft, nontender, bowel sounds present Musculoskeletal:  Improved pitting LE edema Skin:  No rash     Labs and Imaging:   Lab 12/06/11 0640 12/05/11 0635 12/04/11 0553  NA 136 136 136  K 4.5 4.2 3.5  CL 94* 94* 94*  CO2 37* 35* 32  BUN 32* 33* 28*  CREATININE 1.33* 1.44* 1.27*  GLUCOSE 132* 130* 136*    Lab 12/06/11 0640 12/05/11 0635 12/04/11 0553  HGB 10.4* 10.5* 10.2*  HCT 32.3* 32.8* 31.7*  WBC 9.4 10.0 11.3*  PLT 172 175 165      Assessment and Plan:  COPD exacerbation, pneumonia ruled out.  No significant changes in CXR  today.  Lab 12/06/11 0640 12/05/11 0635 12/04/11 0553 12/03/11 0505 12/02/11 0330  WBC 9.4 10.0 11.3* 7.9 5.8   -->continue bronchodilators --> Prednisone 40 mg PO bid - start taper once bspasm subsides -->Avelox PO per dashboard   CHF exacerbation  Lab 12/06/11 0640 12/05/11 0635 12/04/11 0553 12/03/11 0505 12/02/11 0330  NA 136 136 136 134* 133*    Lab 12/06/11 0640 12/05/11 0635 12/04/11 0553 12/03/11 0505 12/02/11 0330  CREATININE 1.33* 1.44* 1.27* 1.14* 0.88   -->Hold Lasix -  Cr risin -->Is/Os   Acute hypercarbic respiratory failure secondary to above, resolved  Lab 12/01/11 1746 12/01/11 1332 12/01/11 1101  PHART 7.373 7.303* 7.295*  PCO2ART 59.7* 70.4* 70.2*  PO2ART 103.0* 102.0* 114.0*  HCO3 34.7* 34.8* 34.2*  TCO2 36 37 36  O2SAT 98.0 97.0 98.0   -->Off BiPAP  Hypokalemia, secondary to diuretic  Lab 12/06/11 0640 12/05/11 0635 12/04/11 0553 12/03/11 0505 12/02/11 0330  K 4.5 4.2 3.5 3.1* 4.0   -->replaced  Atrial flutter / HTN -->continue Coumadin -->Verapamil -->Norvasc  Hypothyroidism -->continue Levothyroxine  Dementia / depression -->Celexa, Aricept  Dyslipidemia -->Simvastatin  Deconditioning  - PT recommends back to SNF, Family prefers home with PT   Best practices / Disposition --> floor status under PCCM -->DNR/DNI -->DVT Px is not indicated (Coumdain) -->Diet -->out of bed -->OK to dc  Foley      Sandrea Hughs, MD Pulmonary  and Critical Care Medicine [facility] Cell [phone]

## 2011-12-06 NOTE — Progress Notes (Signed)
ANTICOAGULATION CONSULT NOTE - Follow Up Consult  Pharmacy Consult:  Coumadin Indication:  Aflutter  Allergies  Allergen Reactions  . Codeine     REACTION: nausea    Patient Measurements: Height: 5\' 4"  (162.6 cm) Weight: 195 lb 12.3 oz (88.8 kg) IBW/kg (Calculated) : 54.7   Vital Signs: Temp: 96.7 F (35.9 C) (12/22 0501) Temp src: Oral (12/22 0501) BP: 117/59 mmHg (12/22 0501) Pulse Rate: 71  (12/22 0501)  Labs:  Basename 12/06/11 0640 12/05/11 0635 12/04/11 0553  HGB 10.4* 10.5* --  HCT 32.3* 32.8* 31.7*  PLT 172 175 165  APTT -- -- --  LABPROT 28.7* 27.6* 31.6*  INR 2.65* 2.52* 3.00*  HEPARINUNFRC -- -- --  CREATININE 1.33* 1.44* 1.27*  CKTOTAL -- -- --  CKMB -- -- --  TROPONINI -- -- --   Estimated Creatinine Clearance: 32.1 ml/min (by C-G formula based on Cr of 1.33).  Admit Complaint: PNA (ruled out), COPD exacerbation Pharmacy System-Based Medication Review: ANTICOAG: Coumadin for aflutter. INR 2.65, therapeutic. No bleeding noted. ID: PNA - D#6 Avelox PO. Remains afebrile. Blood cultures are neg, WBC WNL. Per pulm, no PNA. D/c abx soon? NEURO: A little confused. Depression/dementia - celexa, aricept PULM: 92-95% 3-4L, on albuterol, spiriva, prednisone; prn albuterol x 1 in last 24 hours. CARDS: HTN, lipidemia, afib/flutter, HF (EF50-55%) - 132/61, HR 71 Norvasc, Lipitor, verapamil; BNP 3549 GI/NUTRITIONl: Heart healthy diet ENDO: Hypothyroidism - synthroid - no TSH. CBGs well controlled. RENAL: Scr 1.33 I/O +359 HEMONC: H/o breast CA, H/H/plts stable - on PO iron Best Practices: Coumadin   Home Coumadin dose: 7mg  PO daily.   Plan:  1. Coumadin 5mg  PO today 2. Daily INR

## 2011-12-07 LAB — PROTIME-INR
INR: 2.89 — ABNORMAL HIGH (ref 0.00–1.49)
Prothrombin Time: 30.7 seconds — ABNORMAL HIGH (ref 11.6–15.2)

## 2011-12-07 LAB — CULTURE, BLOOD (ROUTINE X 2)
Culture  Setup Time: 201212171645
Culture  Setup Time: 201212171645
Culture: NO GROWTH
Culture: NO GROWTH

## 2011-12-07 MED ORDER — WARFARIN SODIUM 2.5 MG PO TABS
2.5000 mg | ORAL_TABLET | Freq: Once | ORAL | Status: AC
  Administered 2011-12-07: 2.5 mg via ORAL
  Filled 2011-12-07: qty 1

## 2011-12-07 MED ORDER — BISOPROLOL FUMARATE 5 MG PO TABS
5.0000 mg | ORAL_TABLET | Freq: Every day | ORAL | Status: DC
  Administered 2011-12-07 – 2011-12-12 (×6): 5 mg via ORAL
  Filled 2011-12-07 (×6): qty 1

## 2011-12-07 MED ORDER — PREDNISONE 20 MG PO TABS
40.0000 mg | ORAL_TABLET | Freq: Every day | ORAL | Status: DC
  Administered 2011-12-08 – 2011-12-09 (×2): 40 mg via ORAL
  Filled 2011-12-07 (×5): qty 2

## 2011-12-07 NOTE — Progress Notes (Signed)
ANTICOAGULATION CONSULT NOTE - Follow Up Consult  Pharmacy Consult for Coumadin Indication: a flutter  Allergies  Allergen Reactions  . Codeine     REACTION: nausea    Patient Measurements: Height: 5\' 4"  (162.6 cm) Weight:  (Patients bed does not weight and not able to stand) IBW/kg (Calculated) : 54.7   Vital Signs: Temp: 97.1 F (36.2 C) (12/23 0533) Temp src: Oral (12/23 0533) BP: 128/60 mmHg (12/23 0533) Pulse Rate: 73  (12/23 0533)  Labs:  Basename 12/07/11 0500 12/06/11 0640 12/05/11 0635  HGB -- 10.4* 10.5*  HCT -- 32.3* 32.8*  PLT -- 172 175  APTT -- -- --  LABPROT 30.7* 28.7* 27.6*  INR 2.89* 2.65* 2.52*  HEPARINUNFRC -- -- --  CREATININE -- 1.33* 1.44*  CKTOTAL -- -- --  CKMB -- -- --  TROPONINI -- -- --   Estimated Creatinine Clearance: 32.1 ml/min (by C-G formula based on Cr of 1.33).   Medications:  Prescriptions prior to admission  Medication Sig Dispense Refill  . albuterol (PROAIR HFA) 108 (90 BASE) MCG/ACT inhaler Inhale 2 puffs into the lungs every 4 (four) hours as needed. For shortness of breath      . ALPRAZolam (XANAX) 0.25 MG tablet Take 0.25 mg by mouth at bedtime as needed. For anxiety      . amLODipine (NORVASC) 5 MG tablet Take 5 mg by mouth daily.        Marland Kitchen atorvastatin (LIPITOR) 20 MG tablet Take 20 mg by mouth at bedtime.       . Calcium Carbonate-Vitamin D (OSCAL 500/200 D-3 PO) Take 1 tablet by mouth daily.        . citalopram (CELEXA) 20 MG tablet Take 20 mg by mouth.        . donepezil (ARICEPT) 10 MG tablet Take 10 mg by mouth at bedtime.       . ferrous sulfate 325 (65 FE) MG tablet Take 325 mg by mouth daily with breakfast.        . furosemide (LASIX) 20 MG tablet Take 20 mg by mouth daily.        Marland Kitchen levothyroxine (SYNTHROID, LEVOTHROID) 100 MCG tablet Take 100 mcg by mouth daily.        . polyethylene glycol (MIRALAX / GLYCOLAX) packet Take 17 g by mouth daily.        Marland Kitchen senna (SENOKOT) 8.6 MG tablet Take 2 tablets by mouth at  bedtime.        Marland Kitchen tiotropium (SPIRIVA HANDIHALER) 18 MCG inhalation capsule Place 1 capsule (18 mcg total) into inhaler and inhale daily.  30 capsule  6  . verapamil (CALAN-SR) 180 MG CR tablet Take 180 mg by mouth 2 (two) times daily.       Marland Kitchen warfarin (COUMADIN) 2 MG tablet Take 2 mg by mouth daily. Take with 5 mg tablet to equal 7 mg.       . warfarin (COUMADIN) 5 MG tablet Take 5 mg by mouth daily. Take with 2 mg tablet to equal 7 mg.       . zolpidem (AMBIEN) 10 MG tablet Take 10 mg by mouth at bedtime as needed. For sleep      . ibuprofen (ADVIL,MOTRIN) 200 MG tablet Take 200 mg by mouth every 8 (eight) hours as needed. For pain       Scheduled:    . albuterol  2.5 mg Nebulization TID  . amLODipine  5 mg Oral Daily  . antiseptic oral rinse  15 mL Mouth Rinse q12n4p  . atorvastatin  20 mg Oral q1800  . chlorhexidine  15 mL Mouth Rinse BID  . citalopram  20 mg Oral Daily  . donepezil  10 mg Oral QHS  . ferrous sulfate  325 mg Oral Q breakfast  . levothyroxine  100 mcg Oral QAC breakfast  . moxifloxacin  400 mg Oral q1800  . predniSONE  40 mg Oral BID  . senna  2 tablet Oral QHS  . sodium chloride  3 mL Intravenous Q12H  . tiotropium  18 mcg Inhalation Daily  . verapamil  180 mg Oral BID  . warfarin  5 mg Oral ONCE-1800   Admit Complaint: PNA (ruled out), COPD exacerbation Pharmacy System-Based Medication Review: ANTICOAG: Coumadin for aflutter. INR 2.89, therapeutic and rising. No bleeding noted.  ID: PNA - D#7 Avelox PO. Remains afebrile. Blood cultures are neg, WBC WNL. Per pulm, no PNA. D/c Avelox? NEURO: A little confused. Depression/dementia - celexa, aricept PULM: 93% 3L [place], on albuterol, spiriva, prednisone; prn albuterol not needed in last 24 hours. CARDS: HTN, lipidemia, afib/flutter, HF (EF50-55%) - 132/64, HR 71 Norvasc, Lipitor, verapamil; BNP 3549 GI/NUTRITIONl: Heart healthy diet ENDO: Hypothyroidism - synthroid - no TSH. CBGs well controlled. RENAL: Scr  1.33 HEMONC: H/o breast CA, H/H/plts stable - on PO iron Best Practices: Coumadin   Home Coumadin dose: 7mg  PO daily.  Assessment: Pt is an 77 yo F on Coumadin PTA for a flutter. INR therapeutic at 2.89.   Goal of Therapy:  INR 2-3   Plan:  1. Coumadin 2.5mg  PO x 1 at 1800 2. Follow-up INR in am  [name] 12/07/2011,9:08 AM

## 2011-12-07 NOTE — Progress Notes (Signed)
Name: [name] MRN: [id number] DOB: 06-Dec-1922    LOS: 6  PCCM Hosp f/u  PROGRESS NOTE  History of Present Illness: 77 y/o WF with COPD and CHF recently adm for pneumonia and discharged to nursing home brought to [facility] ED 12/17 with worsening nonproductive cough, lower extremities swelling and respiratory difficulty.  Found to be in hypercarbic respiratory failure, placed on BiPAP/ NCB status     Cultures: 12/17 MRSA screen > neg 12/17  BC> neg  12/17 UC > neg   Antibiotics: 12/17  Vancomycin(HCAP)>>>12/18 12/17  Zosyn(HCAP)>>>12/18 1218  Moxi (AECOPD)> 12/23  Tests / Events: 12/17  Admitted for hypercarbic respiratory failure.  BiPAP started. 12/18  Off BiPAP, reports feeling better   Overnight Ok wob at rest, on nasal 02, congested cough   Vital Signs: Temp:  [97.1 F (36.2 C)-98.3 F (36.8 C)] 97.1 F (36.2 C) (12/23 0533) Pulse Rate:  [71-73] 73  (12/23 0533) Resp:  [16-20] 20  (12/23 0533) BP: (128-137)/(60-68) 137/66 mmHg (12/23 1023) SpO2:  [93 %-95 %] 95 % (12/23 0533) I/O last 3 completed shifts: In: 320 [P.O.:320] Out: 1300 [Urine:1300]  Physical Examination: General:  Comfortable, in no distress Neuro:  No deficits, alert, ? Depressed affect  HEENT:  PERRL Neck:  Cannot assess JVD   Cardiovascular:  RRR, no murmurs Lungs:  Few rales /diffuse  Bilateral mid exp rhonchi Abdomen:  Soft, nontender, bowel sounds present Musculoskeletal:  Improved pitting LE edema Skin:  No rash     Labs and Imaging:   Lab 12/06/11 0640 12/05/11 0635 12/04/11 0553  NA 136 136 136  K 4.5 4.2 3.5  CL 94* 94* 94*  CO2 37* 35* 32  BUN 32* 33* 28*  CREATININE 1.33* 1.44* 1.27*  GLUCOSE 132* 130* 136*    Lab 12/06/11 0640 12/05/11 0635 12/04/11 0553  HGB 10.4* 10.5* 10.2*  HCT 32.3* 32.8* 31.7*  WBC 9.4 10.0 11.3*  PLT 172 175 165      Assessment and Plan:  COPD exacerbation, pneumonia ruled out.  No significant changes in CXR today. -->continue  bronchodilators --> Prednisone 40 mg reduce to one each am-->Avelox PO per dashboard   CHF exacerbation -->Hold Lasix -  Creat plateau recheck 12/24    Acute hypercarbic respiratory failure secondary to above, resolved -  Hypokalemia, secondary to diuretic Resolved  Atrial flutter / HTN -->continue Coumadin -->Verapamil changed to bisoprolol 12/23 to avoid two calcium channel blockers -->Norvasc  Hypothyroidism -->continue Levothyroxine  Dementia / depression -->Celexa, Aricept  Dyslipidemia -->Simvastatin  Deconditioning  - PT recommends back to SNF, Family prefers home with PT   Best practices / Disposition --> floor status under PCCM -->DNR/DNI -->DVT Px is not indicated (Coumdain) -->Diet -->out of bed       Sandrea Hughs, MD Pulmonary and Critical Care Medicine [facility] Cell [phone]

## 2011-12-08 MED ORDER — IPRATROPIUM BROMIDE 0.02 % IN SOLN
0.5000 mg | RESPIRATORY_TRACT | Status: DC
  Administered 2011-12-08 – 2011-12-12 (×18): 0.5 mg via RESPIRATORY_TRACT
  Filled 2011-12-08 (×19): qty 2.5

## 2011-12-08 MED ORDER — POTASSIUM CHLORIDE 20 MEQ/15ML (10%) PO LIQD
20.0000 meq | Freq: Once | ORAL | Status: AC
  Administered 2011-12-08: 20 meq via ORAL
  Filled 2011-12-08: qty 15

## 2011-12-08 MED ORDER — ALBUTEROL SULFATE (5 MG/ML) 0.5% IN NEBU
2.5000 mg | INHALATION_SOLUTION | RESPIRATORY_TRACT | Status: DC
  Administered 2011-12-08 – 2011-12-12 (×18): 2.5 mg via RESPIRATORY_TRACT
  Filled 2011-12-08 (×19): qty 0.5

## 2011-12-08 MED ORDER — FUROSEMIDE 20 MG PO TABS
20.0000 mg | ORAL_TABLET | Freq: Once | ORAL | Status: AC
  Administered 2011-12-08: 20 mg via ORAL
  Filled 2011-12-08: qty 1

## 2011-12-08 MED ORDER — WARFARIN SODIUM 2.5 MG PO TABS
2.5000 mg | ORAL_TABLET | Freq: Once | ORAL | Status: AC
  Administered 2011-12-08: 2.5 mg via ORAL
  Filled 2011-12-08: qty 1

## 2011-12-08 NOTE — Progress Notes (Addendum)
Name: [name] MRN: [id number] DOB: [birth year]    LOS: 7  PCCM Hosp f/u  PROGRESS NOTE  History of Present Illness: 77 y/o WF with COPD and CHF recently adm for pneumonia and discharged to nursing home brought to [facility] ED 12/17 with worsening nonproductive cough, lower extremities swelling and respiratory difficulty.  Found to be in hypercarbic respiratory failure, placed on BiPAP/ NCB status     Cultures: 12/17 MRSA screen > neg 12/17  BC> neg  12/17 UC > neg   Antibiotics: 12/17  Vancomycin(HCAP)>>>12/18 12/17  Zosyn(HCAP)>>>12/18 1218  Moxi (AECOPD)> 12/23  Tests / Events: 12/17  Admitted for hypercarbic respiratory failure.  BiPAP started. 12/18  Off BiPAP, reports feeling better 12/24 - improved but still wheeze  Overnight RN feels patient much improved. LEss wheeze though still wheezing +. RN says patient not able tos tand with assist at atll. More awake. Family (spoke to d-i-l) they want her to go back to SNF - they report at baseline only intermittent wheeze  Patient confused though (?dementia) - thinks she lives at home and has no idea why she came to hospital   Vital Signs: Temp:  [97.5 F (36.4 C)-98.9 F (37.2 C)] 97.5 F (36.4 C) (12/24 0654) Pulse Rate:  [57-65] 65  (12/24 0654) Resp:  [16-20] 16  (12/24 0654) BP: (119-156)/(57-75) 156/75 mmHg (12/24 0654) SpO2:  [91 %-95 %] 92 % (12/24 0801) I/O last 3 completed shifts: In: 450 [P.O.:450] Out: 1325 [Urine:1325]  Physical Examination: General:  Comfortable, in no distress Neuro:  No deficits, alert, ? Depressed affect Patient confused though (?dementia) - thinks she lives at home and has no idea why she came to hospital. Alert. GCS 15. RASS +1. Eating. No choking HEENT:  PERRL Neck:  Cannot assess JVD   Cardiovascular:  RRR, no murmurs Lungs:  Few rales /diffuse  Bilateral mid exp rhonchi Abdomen:  Soft, nontender, bowel sounds present Musculoskeletal:  Improved pitting LE edema Skin:  No rash       Labs and Imaging:   Lab 12/06/11 0640 12/05/11 0635 12/04/11 0553  NA 136 136 136  K 4.5 4.2 3.5  CL 94* 94* 94*  CO2 37* 35* 32  BUN 32* 33* 28*  CREATININE 1.33* 1.44* 1.27*  GLUCOSE 132* 130* 136*    Lab 12/06/11 0640 12/05/11 0635 12/04/11 0553  HGB 10.4* 10.5* 10.2*  HCT 32.3* 32.8* 31.7*  WBC 9.4 10.0 11.3*  PLT 172 175 165      Assessment and Plan:  COPD exacerbation, pneumonia ruled out.  No significant changes in CXR today. -->continue bronchodilators - change spiriva to  atrovent nebs (still wheezing) --> Prednisone 40 mg reduce to one each am -->Avelox PO per dashboard  - continue o2 (is on baseline 2L [place])  CHF exacerbation -->Lasix 20mg  x 1 on 12/24 (wheezing +)  -- check bnp 12/25 -> if high, diurese again 12/25 Acute hypercarbic respiratory failure secondary to above, resolved -  Hypokalemia, secondary to diuretic Resolved  Atrial flutter / HTN -->continue Coumadin -->Verapamil changed to bisoprolol 12/23 to avoid two calcium channel blockers -->Norvasc  Hypothyroidism -->continue Levothyroxine  Dementia / depression -->Celexa, Aricept  Dyslipidemia -->Simvastatin  Deconditioning  - PT recommends back to SNF, Family intially prefered home with PT. On 12/24 - DR Marchelle Gearing spoke to d-i-l and she said family want patient to go back to [facility]   [employer] / Disposition --> floor status under PCCM; keep in hospital trhough 12/26 -  still wheezing -->DNR/DNI -->DVT Px is not indicated (Coumdain) -->Diet -->out of bed      Lavinia Sharps Pulmonary and Critical Care Medicine [facility] Pager [phone]

## 2011-12-08 NOTE — Progress Notes (Signed)
PT CANCELLATION NOTE  Pt. Requested for PT to come back after she had finished her lunch. Will reattempt as time allows. RN made aware. THanks 12/08/2011 Fredrich Birks PTA [phone] pager [phone] office

## 2011-12-08 NOTE — Progress Notes (Signed)
ANTICOAGULATION CONSULT NOTE - Follow Up Consult  Pharmacy Consult:  Coumadin Indication:  Aflutter  Allergies  Allergen Reactions  . Codeine     REACTION: nausea    Patient Measurements: Height: 5\' 4"  (162.6 cm) Weight:  (Bed scale does not work and pt not steady enough to stand) IBW/kg (Calculated) : 54.7   Vital Signs: Temp: 97.5 F (36.4 C) (12/24 0654) Temp src: Axillary (12/24 0654) BP: 156/75 mmHg (12/24 0654) Pulse Rate: 65  (12/24 0654)  Labs:  Basename 12/08/11 0644 12/07/11 0500 12/06/11 0640  HGB -- -- 10.4*  HCT -- -- 32.3*  PLT -- -- 172  APTT -- -- --  LABPROT 31.7* 30.7* 28.7*  INR 3.01* 2.89* 2.65*  HEPARINUNFRC -- -- --  CREATININE -- -- 1.33*  CKTOTAL -- -- --  CKMB -- -- --  TROPONINI -- -- --   Estimated Creatinine Clearance: 32.1 ml/min (by C-G formula based on Cr of 1.33).  Admit Complaint: PNA (ruled out), COPD exacerbation  Pharmacy System-Based Medication Review:  ANTICOAG: Coumadin for aflutter. INR 3.01, trending upward still, therapeutic and no bleeding noted.  ID: PNA - D#7 Avelox PO. Remains afebrile. Blood cultures are neg, WBC WNL. Per pulm, no PNA. D/c abx soon?  NEURO: A little confused. Depression/dementia - celexa, aricept  PULM: 92-95% 3-4L, on albuterol, spiriva, prednisone;  hours.  CARDS: HTN, lipidemia, afib/flutter, HF (EF50-55%) - VSS Norvasc, Lipitor, verapamil; BNP 3549 on admit  GI/NUTRITIONl: Heart healthy diet  ENDO: Hypothyroidism - synthroid - no TSH. CBGs well controlled.  RENAL: Scr stable  HEMONC: H/o breast CA, H/H/plts stable - on PO iron  Best Practices: Coumadin   Home Coumadin dose: 7mg  PO daily.   Plan:  1. Coumadin 2.5mg  PO today 2. Daily INR

## 2011-12-09 DIAGNOSIS — J441 Chronic obstructive pulmonary disease with (acute) exacerbation: Secondary | ICD-10-CM

## 2011-12-09 DIAGNOSIS — J9600 Acute respiratory failure, unspecified whether with hypoxia or hypercapnia: Secondary | ICD-10-CM

## 2011-12-09 DIAGNOSIS — I5031 Acute diastolic (congestive) heart failure: Secondary | ICD-10-CM

## 2011-12-09 DIAGNOSIS — J158 Pneumonia due to other specified bacteria: Secondary | ICD-10-CM

## 2011-12-09 LAB — CBC
Hemoglobin: 10.6 g/dL — ABNORMAL LOW (ref 12.0–15.0)
MCH: 29.9 pg (ref 26.0–34.0)
MCV: 93.2 fL (ref 78.0–100.0)
RBC: 3.55 MIL/uL — ABNORMAL LOW (ref 3.87–5.11)

## 2011-12-09 LAB — BASIC METABOLIC PANEL
CO2: 36 mEq/L — ABNORMAL HIGH (ref 19–32)
Calcium: 8.7 mg/dL (ref 8.4–10.5)
Creatinine, Ser: 1.03 mg/dL (ref 0.50–1.10)
Glucose, Bld: 111 mg/dL — ABNORMAL HIGH (ref 70–99)

## 2011-12-09 MED ORDER — FUROSEMIDE 20 MG PO TABS
20.0000 mg | ORAL_TABLET | Freq: Every day | ORAL | Status: AC
  Administered 2011-12-09: 20 mg via ORAL
  Filled 2011-12-09: qty 1

## 2011-12-09 MED ORDER — WARFARIN SODIUM 5 MG PO TABS
5.0000 mg | ORAL_TABLET | Freq: Once | ORAL | Status: AC
  Administered 2011-12-09: 5 mg via ORAL
  Filled 2011-12-09: qty 1

## 2011-12-09 NOTE — Progress Notes (Addendum)
ANTICOAGULATION CONSULT NOTE - Follow Up Consult  Pharmacy Consult:  Coumadin Indication:  Aflutter  Allergies  Allergen Reactions  . Codeine     REACTION: nausea    Patient Measurements: Height: 5\' 4"  (162.6 cm) Weight: 197 lb 8.5 oz (89.6 kg) IBW/kg (Calculated) : 54.7   Vital Signs: Temp: 95.7 F (35.4 C) (12/25 0610) Temp src: Oral (12/25 0610) BP: 169/74 mmHg (12/25 0610) Pulse Rate: 68  (12/25 0610)  Labs:  Basename 12/09/11 0450 12/08/11 0644 12/07/11 0500  HGB 10.6* -- --  HCT 33.1* -- --  PLT 177 -- --  APTT -- -- --  LABPROT 28.3* 31.7* 30.7*  INR 2.60* 3.01* 2.89*  HEPARINUNFRC -- -- --  CREATININE 1.03 -- --  CKTOTAL -- -- --  CKMB -- -- --  TROPONINI -- -- --   Estimated Creatinine Clearance: 41.7 ml/min (by C-G formula based on Cr of 1.03).  Admit Complaint: PNA (ruled out), COPD exacerbation  Pharmacy System-Based Medication Review:  ANTICOAG: Coumadin for aflutter. INR 2.60, home dose 7mg  daily with INR elevated at [facility]. INR currently therapeutic and no bleeding noted. Patient is on prednisone which can increase effects of warfarin  ID: PNA -complted 7days Avelox. Remains afebrile. Blood cultures are neg, afeb, WBC=13. Per pulm, no PNA.   NEURO: A little confused. Depression/dementia - celexa, aricept  PULM: 96% 3-4L, on albuterol, spiriva, prednisone 40mg  q24h;  hours.  CARDS: SBP elevated: 169-180/74-85, HR=68. PMH= HTN, lipidemia, afib/flutter, HF (EF50-55%), meds:  Norvasc, bisoprolol, lipitor, verapamil; BNP 3549 on admit  GI/NUTRITIONl: Heart healthy diet  ENDO: Hypothyroidism - synthroid - no TSH. CBGs well controlled.  RENAL: Scr stable  HEMONC: H/o breast CA, H/H/plts stable - on PO iron  Best Practices: Coumadin   Home Coumadin dose: 7mg  PO daily.   Plan:  1. Coumadin 5mg  PO today 2. Daily INR

## 2011-12-09 NOTE — Progress Notes (Signed)
Name: [name] MRN: [id number] DOB: [birth year]    LOS: 8  PCCM Hosp f/u  PROGRESS NOTE  History of Present Illness: 77 y/o WF with COPD and CHF recently adm for pneumonia and discharged to nursing home brought to [facility] ED 12/17 with worsening nonproductive cough, lower extremities swelling and respiratory difficulty.  Found to be in hypercarbic respiratory failure, placed on BiPAP/ NCB status.  TODAY-She denies needs, insists she feels better, and looks forward to going home. Admits needs for assistance w/ ADLs and ongoing wheeze.  Cultures: 12/17 MRSA screen > neg 12/17  BC> neg  12/17 UC > neg   Antibiotics: 12/17  Vancomycin(HCAP)>>>12/18 12/17  Zosyn(HCAP)>>>12/18 1218  Moxi (AECOPD)> 12/23  Tests / Events: 12/17  Admitted for hypercarbic respiratory failure.  BiPAP started. 12/18  Off BiPAP, reports feeling better 12/24 - improved but still wheeze   Vital Signs: Temp:  [95.7 F (35.4 C)-97.1 F (36.2 C)] 95.7 F (35.4 C) (12/25 0610) Pulse Rate:  [68-74] 68  (12/25 0610) Resp:  [18-20] 18  (12/25 0610) BP: (131-180)/(68-85) 169/74 mmHg (12/25 0610) SpO2:  [91 %-96 %] 93 % (12/25 0813) Weight:  [197 lb 8.5 oz (89.6 kg)] 197 lb 8.5 oz (89.6 kg) (12/25 0610) I/O last 3 completed shifts: In: 1000 [P.O.:1000] Out: 2575 [Urine:2575]  Physical Examination: General:  Comfortable, in no distress Neuro:  No deficits, alert, Patient confused though (?dementia) - thinks she lives at home and has no idea why she came to hospital. Alert. GCS 15. RASS +1.  No choking HEENT:  PERRL Neck:  Cannot assess JVD   Cardiovascular:  RRR, no murmurs Lungs:  Bilateral sustained wheeze, unlabored Abdomen:  Soft, nontender, bowel sounds present Musculoskeletal:  Improved pitting LE edema Skin:  No rash     Labs and Imaging:   Lab 12/09/11 0450 12/06/11 0640 12/05/11 0635  NA 134* 136 136  K 4.3 4.5 4.2  CL 94* 94* 94*  CO2 36* 37* 35*  BUN 21 32* 33*  CREATININE 1.03 1.33*  1.44*  GLUCOSE 111* 132* 130*    Lab 12/09/11 0450 12/06/11 0640 12/05/11 0635  HGB 10.6* 10.4* 10.5*  HCT 33.1* 32.3* 32.8*  WBC 13.0* 9.4 10.0  PLT 177 172 175      Assessment and Plan:  COPD exacerbation, pneumonia ruled out.  No significant changes in CXR today. -->continue bronchodilators - change spiriva to  atrovent nebs (still wheezing). Wheeze is probably chronic. --> Prednisone 40 mg reduce to one each am -->Avelox PO per dashboard - done - continue o2 (is on baseline 2L [place])  CHF exacerbation -->Lasix 20mg  x 1 on 12/24 (wheezing +)  -- check bnp- 4292- Will repeat Lasix 20 mg x 1 Acute hypercarbic respiratory failure secondary to above, resolved -  Hypokalemia, secondary to diuretic Resolved  Atrial flutter / HTN -->continue Coumadin -->Verapamil changed to bisoprolol 12/23 to avoid two calcium channel blockers -->Norvasc  Hypothyroidism -->continue Levothyroxine  Dementia / depression -->Celexa, Aricept  Dyslipidemia -->Simvastatin  Deconditioning  - PT recommends back to SNF, Family intially prefered home with PT. On 12/24 - DR Marchelle Gearing spoke to d-i-l and she said family want patient to go back to [facility]   [employer] / Disposition --> floor status under PCCM; keep in hospital trhough 12/26 -  still wheezing. -->DNR/DNI -->DVT Px is not indicated (Coumdain) -->Diet -->out of bed      Lavinia Sharps Pulmonary and Critical Care Medicine [facility] Pager [phone]

## 2011-12-10 DIAGNOSIS — J984 Other disorders of lung: Secondary | ICD-10-CM

## 2011-12-10 DIAGNOSIS — I509 Heart failure, unspecified: Secondary | ICD-10-CM

## 2011-12-10 LAB — PROTIME-INR
INR: 2.19 — ABNORMAL HIGH (ref 0.00–1.49)
Prothrombin Time: 24.7 seconds — ABNORMAL HIGH (ref 11.6–15.2)

## 2011-12-10 MED ORDER — FUROSEMIDE 10 MG/ML IJ SOLN
40.0000 mg | Freq: Once | INTRAMUSCULAR | Status: DC
  Filled 2011-12-10: qty 4

## 2011-12-10 MED ORDER — PREDNISONE 20 MG PO TABS
30.0000 mg | ORAL_TABLET | Freq: Every day | ORAL | Status: DC
  Administered 2011-12-10 – 2011-12-12 (×3): 30 mg via ORAL
  Filled 2011-12-10 (×4): qty 1

## 2011-12-10 MED ORDER — FUROSEMIDE 10 MG/ML IJ SOLN
40.0000 mg | Freq: Two times a day (BID) | INTRAMUSCULAR | Status: DC
  Administered 2011-12-10 – 2011-12-12 (×4): 40 mg via INTRAVENOUS
  Filled 2011-12-10 (×6): qty 4

## 2011-12-10 MED ORDER — FUROSEMIDE 40 MG PO TABS
40.0000 mg | ORAL_TABLET | Freq: Every day | ORAL | Status: DC
  Administered 2011-12-10: 40 mg via ORAL
  Filled 2011-12-10: qty 1

## 2011-12-10 MED ORDER — WARFARIN SODIUM 6 MG PO TABS
7.0000 mg | ORAL_TABLET | Freq: Once | ORAL | Status: AC
  Administered 2011-12-10: 7 mg via ORAL
  Filled 2011-12-10: qty 1

## 2011-12-10 MED ORDER — FUROSEMIDE 40 MG PO TABS: 40.0000 mg | ORAL_TABLET | Freq: Two times a day (BID) | ORAL | Status: DC

## 2011-12-10 MED ORDER — POTASSIUM CHLORIDE CRYS ER 20 MEQ PO TBCR
40.0000 meq | EXTENDED_RELEASE_TABLET | Freq: Once | ORAL | Status: AC
  Administered 2011-12-10: 40 meq via ORAL
  Filled 2011-12-10: qty 2

## 2011-12-10 NOTE — Consult Note (Signed)
Cardiology Consult Note   Patient ID: [name] MRN: [id number], DOB/AGE: 1922/11/14   Admit date: 12/01/2011 Date of Consult: 12/10/2011  Primary Physician: Herb Grays, MD, MD Primary Cardiologist: Tereso Newcomer, PA-C  Pt. Profile: Ms. Faulconer is a 77 yo female with PMHx significant for COPD, atrial flutter (anticoagulated on Coumadin, rate-controlled on Verapamil outpatient), congestive heart failure (EF 50% to 55%. Wall motion was normal ;there were no regional wall motion abnormalities, mild AS, MAC, mild LAE, mild RAE, PASP 35, mild pulmonary HTN), HTN, HL recently discharged to [facility] on 11/26 after admit for PNA. Readmitted to [facility] hospital with acute respiratory failure secondary to COPD and CHF exacerbation. Cardiology is consulted to assess risk/benefit of continuing Coumadin anticoagulation and CHF evaluation.   Problem List: Past Medical History  Diagnosis Date  . COPD (chronic obstructive pulmonary disease)   . Hyperlipidemia   . Hypertension   . Osteoporosis   . Hypothyroid   . Memory loss   . Anemia     Presumed GI on iron replacement hemoglobin in the 10 range  . Atrial flutter     Coumadin initiated 7/12 and followed by PCP; patient felt to be high risk for RFCA secondary to COPD;  echo was done 7/30:  EF 50% to 55%. Wall motion was normal ;there were no regional wall motion abnormalities, mild AS, MAC, mild LAE, mild RAE, PASP 35, mild pulmonary HTN  . On home O2   . Anxiety   . Depression   . CHF (congestive heart failure)   . Emphysema   . Chronic kidney disease   . Breast cancer   . DEMENTIA 11/04/11    "she has some significant dementia"    Past Surgical History  Procedure Date  . Tonsillectomy   . Appendectomy   . Abdominal hysterectomy   . Mastectomy 1990    right  . Cataract extraction     "don't know if both eyes or if she had lenses put in"     Allergies:  Allergies  Allergen Reactions  . Codeine     REACTION: nausea    HPI:    Recently discharged to [facility] on 11/26 after admit for PNA. On admit to [facility] lasix cut back from her baseline of 40 po bid. Readmitted to [facility] hospital with acute respiratory failure secondary to COPD and CHF exacerbation. Cardiology is consulted to assess risk/benefit of continuing Coumadin anticoagulation and CHF evaluation.    On admit pt noted to have worsening nonproductive cough, LE edema and respiratory difficulty. She was found to be in hypercarbic respiratory failure and placed on BiPAP. She was subsequently admitted to Critical Care Pulmonology, placed on nebs and steroid therapy, prophylactic abx 2/2 mild leukocytosis and diuresed with Lasix IV. She diuresed well, however, Cr spiked, Lasix was held temporarily to allow renal function to recover. BNP on 12/22 was 3549 and elevated to 4292 on 12/25 with improvement in renal function. Wt on admit was 190 pounds now 201 pounds.   Echo today which i read shows normal EF 55% with grade II diastolic dysfunction and mils AS (mean 11)  At baseline has O2-dependent COPD. (nonsmoker). Has had 2 recent falls at home but after stay at [facility] family planning to get 24/7 home Nursing Assist.   Inpatient Medications:     . albuterol  2.5 mg Nebulization Q4H  . amLODipine  5 mg Oral Daily  . antiseptic oral rinse  15 mL Mouth Rinse q12n4p  .  atorvastatin  20 mg Oral q1800  . bisoprolol  5 mg Oral Daily  . chlorhexidine  15 mL Mouth Rinse BID  . citalopram  20 mg Oral Daily  . donepezil  10 mg Oral QHS  . ferrous sulfate  325 mg Oral Q breakfast  . furosemide  40 mg Oral Daily  . ipratropium  0.5 mg Nebulization Q4H  . levothyroxine  100 mcg Oral QAC breakfast  . potassium chloride  40 mEq Oral Once  . predniSONE  30 mg Oral Q breakfast  . senna  2 tablet Oral QHS  . sodium chloride  3 mL Intravenous Q12H  . warfarin  5 mg Oral ONCE-1800  . warfarin  7 mg Oral ONCE-1800  . DISCONTD: predniSONE  40 mg Oral Q breakfast    Prescriptions prior to admission  Medication Sig Dispense Refill  . albuterol (PROAIR HFA) 108 (90 BASE) MCG/ACT inhaler Inhale 2 puffs into the lungs every 4 (four) hours as needed. For shortness of breath      . ALPRAZolam (XANAX) 0.25 MG tablet Take 0.25 mg by mouth at bedtime as needed. For anxiety      . amLODipine (NORVASC) 5 MG tablet Take 5 mg by mouth daily.        Marland Kitchen atorvastatin (LIPITOR) 20 MG tablet Take 20 mg by mouth at bedtime.       . Calcium Carbonate-Vitamin D (OSCAL 500/200 D-3 PO) Take 1 tablet by mouth daily.        . citalopram (CELEXA) 20 MG tablet Take 20 mg by mouth.        . donepezil (ARICEPT) 10 MG tablet Take 10 mg by mouth at bedtime.       . ferrous sulfate 325 (65 FE) MG tablet Take 325 mg by mouth daily with breakfast.        . furosemide (LASIX) 20 MG tablet Take 20 mg by mouth daily.        Marland Kitchen levothyroxine (SYNTHROID, LEVOTHROID) 100 MCG tablet Take 100 mcg by mouth daily.        . polyethylene glycol (MIRALAX / GLYCOLAX) packet Take 17 g by mouth daily.        Marland Kitchen senna (SENOKOT) 8.6 MG tablet Take 2 tablets by mouth at bedtime.        Marland Kitchen tiotropium (SPIRIVA HANDIHALER) 18 MCG inhalation capsule Place 1 capsule (18 mcg total) into inhaler and inhale daily.  30 capsule  6  . verapamil (CALAN-SR) 180 MG CR tablet Take 180 mg by mouth 2 (two) times daily.       Marland Kitchen warfarin (COUMADIN) 2 MG tablet Take 2 mg by mouth daily. Take with 5 mg tablet to equal 7 mg.       . warfarin (COUMADIN) 5 MG tablet Take 5 mg by mouth daily. Take with 2 mg tablet to equal 7 mg.       . zolpidem (AMBIEN) 10 MG tablet Take 10 mg by mouth at bedtime as needed. For sleep      . ibuprofen (ADVIL,MOTRIN) 200 MG tablet Take 200 mg by mouth every 8 (eight) hours as needed. For pain        Family History  Problem Relation Age of Onset  . Emphysema    . Heart attack    . Diabetes    . Hypertension    . Cancer    . Anxiety disorder    . Depression    . Coronary artery disease  History   Social History  . Marital Status: Married    Spouse Name: N/A    Number of Children: N/A  . Years of Education: N/A   Occupational History  . Not on file.   Social History Main Topics  . Smoking status: Never Smoker   . Smokeless tobacco: Never Used  . Alcohol Use: No  . Drug Use: No  . Sexually Active: No   Other Topics Concern  . Not on file   Social History Narrative  . No narrative on file     Review of Systems: General: negative for chills, fever, night sweats or weight changes.  Cardiovascular: positive for DOE, edema, orthopnea, PND and SOB, negative for chest pain Dermatological: negative for rash Respiratory: negative for cough, positive for wheezing Urologic: negative for hematuria Abdominal: negative for nausea, vomiting, diarrhea, bright red blood per rectum, melena, or hematemesis Neurologic: negative for visual changes, syncope, or dizziness All other systems reviewed and are otherwise negative except as noted above.  Physical Exam: Blood pressure 150/76, pulse 72, temperature 97.5 F (36.4 C), temperature source Axillary, resp. rate 20, height 5\' 4"  (1.626 m), weight 91.4 kg (201 lb 8 oz), SpO2 98.00%.    General: Elderly, in no acute distress Head: Normocephalic, atraumatic, sclera non-icteric  Neck: Supple. Negative for carotid bruits. JVP hart to see probably9-10 Lungs: Accessory muscle use with respirations, n/c applied, expiratory wheeze and rhonchi that clears with cough auscultated, no rales Heart: distant heart sounds, RRR with S1 S2. Soft AS murmur, rubs, or gallops appreciated. Abdomen: Soft, non-tender, non-distended with normoactive bowel sounds. No hepatomegaly. No rebound/guarding. No obvious abdominal masses. Msk:  Strength and tone appears normal for age. Extremities: 1+ pitting edema to mid calf, no clubbing or cyanosis.  Distal pedal pulses are 2+ and equal bilaterally. Neuro: Alert and oriented X 3. Moves all extremities  spontaneously. Psych:  Responds to questions appropriately with a normal affect that is distant at times.   Labs: Recent Labs  [facility] 12/09/11 0450   WBC 13.0*   HGB 10.6*   HCT 33.1*   MCV 93.2   PLT 177   No results found for this basename: VITAMINB12,FOLATE,FERRITIN,TIBC,IRON,RETICCTPCT in the last 72 hours No results found for this basename: DDIMER:2 in the last 72 hours  Lab 12/09/11 0450 12/06/11 0640 12/05/11 0635  NA 134* 136 136  K 4.3 4.5 4.2  CL 94* 94* 94*  CO2 36* 37* 35*  BUN 21 32* 33*  CREATININE 1.03 1.33* 1.44*  CALCIUM 8.7 9.2 9.1  PROT -- -- --  BILITOT -- -- --  ALKPHOS -- -- --  ALT -- -- --  AST -- -- --  AMYLASE -- -- --  LIPASE -- -- --  GLUCOSE 111* 132* 130*   No results found for this basename: HGBA1C in the last 72 hours No results found for this basename: CKTOTAL:3,CKMB:3,CKMBINDEX:3,TROPONINI:3 in the last 72 hours No components found with this basename: POCBNP No results found for this basename: CHOL,HDL,LDLCALC,TRIG,CHOLHDL,LDLDIRECT in the last 72 hours No results found for this basename: TSH,T4TOTAL,FREET3,T3FREE,THYROIDAB in the last 72 hours  Results for [facility], Oree S (MRN [id number]) as of 12/10/2011 13:42  Ref. Range 12/09/2011 04:50  Pro B Natriuretic peptide (BNP) Latest Range: 0-450 pg/mL 4292.0 (H)    Radiology/Studies: Dg Chest Port 1 View  12/05/2011  *RADIOLOGY REPORT*  Clinical Data: Pneumonia.  PORTABLE CHEST - 1 VIEW  Comparison: 12/03/2011.  Findings: Cardiomegaly. The patient is status post right mastectomy and axillary  node dissection.  Chronic markings left greater than right lung bases.  COPD with hyperinflation.  Calcified tortuous aorta.  Osteopenia. Overall improved airspace consolidation in the left lung compared with yesterday's study.  IMPRESSION: Overall improved aeration.  COPD.  Cardiomegaly.  Original Report Authenticated By: Elsie Stain, M.D.   Dg Chest Port 1 View  12/03/2011  *RADIOLOGY REPORT*   Clinical Data: Follow up lung infiltrates  PORTABLE CHEST - 1 VIEW  Comparison: 12/02/2011  Findings: Heart size appears normal.  There is moderate pulmonary edema identified.  Airspace consolidation within the left midlung and atelectasis in the left base is unchanged from previous exam.  No new findings.  IMPRESSION:  1.  No change in aeration the lungs compared with previous exam.  Original Report Authenticated By: Rosealee Albee, M.D.   Portable Chest Xray In Am  12/02/2011  *RADIOLOGY REPORT*  Clinical Data: Follow-up pulmonary edema. COPD.  Breast cancer.  PORTABLE CHEST - 1 VIEW  Comparison: 12/01/2011  Findings: There has been improvement in the ill-defined bilateral perihilar infiltrates since previous study.  Persistent scarring is seen in the right upper and left lower lobes.  Changes of COPD are again seen.  Heart size is stable.  No evidence of pneumothorax or pleural effusion.  Surgical clips again seen in the right axilla.  IMPRESSION: Improving bilateral perihilar infiltrates.  COPD.  Original Report Authenticated By: Danae Orleans, M.D.   Dg Chest Portable 1 View  12/01/2011  *RADIOLOGY REPORT*  Clinical Data: Shortness of breath  PORTABLE CHEST - 1 VIEW  Comparison: 11/08/2011  Findings: Cardio mediastinal silhouette is stable.  Worsening central vascular congestion and perihilar interstitial prominence suspicious for mild interstitial edema.  Surgical clips in the right axilla again noted.  There is patchy airspace disease in the right upper lobe and right base suspicious for superimposed pneumonia.  IMPRESSION:   Worsening central vascular congestion and perihilar interstitial prominence suspicious for mild interstitial edema.  Surgical clips in the right axilla again noted.  There is patchy airspace disease in the right upper lobe and right base suspicious for superimposed pneumonia.  Original Report Authenticated By: Natasha Mead, M.D.    EKG: 12/18 NSR, 72 bpm, no ST-T wave  changes  ASSESSMENT AND PLAN:   1. Coumadin anticoagulation-  2/2 arial flutter, not visualized this admission; on Verapamil, Norvasc for rhythm control outpatient. Verapamil was recently replaced with Bisoprolol. She is at intermediate bleeding risk on Coumadin; however, anticoagulation benefit outweighs bleeding risk. Per son, pt has fallen twice this year, including just prior to last admission. It is the preference of the family to discharge back to [facility] for further management of chronic conditions and assistance with ambulation.   2. Acute diastolic CHF- no rales on exam, however, expiratory wheeze and nonproductive cough persist; 1+ pitting edema in LEs; pt states she is at baseline status; BNP elevated with improved renal function    - Preliminary ECHO results: grade 2 diastolic dysfunction, AS   -  Would benefit from further diuresis, continue Lasix    Signed, R. Hurman Horn, PA-C 12/10/2011, 1:41 PM  Patient seen and examined with Hurman Horn PA-C. We discussed all aspects of the encounter. I agree with the assessment and plan as stated above. Long talk with patient and her son about CHF and coumadin.  Currently has mild volume overload. Would resume IV lasix at 40 bid for 1-2 days. Ok if CR bumps slightly. On d/c would resume lasix 40 po bid  and give them a firm weight range to protect and for [facility] to follow. I.e, "goal weight 190-195 pounds". If weight at goal take 40 bid. If weight above goal take an extra dose. We can help manage this through the HF clinic.  Regarding anticoagulation, her CHADS-VASC score is 4 (4%/year risk of stroke) so if she has close f/u would like to continue coumadin. Her son says she will NA with her 24/7 so I think it is safe to continue at this point. If recurrent falls can reconsider.   Daniel Bensimhon,MD 3:21 PM

## 2011-12-10 NOTE — Progress Notes (Signed)
  Echocardiogram 2D Echocardiogram has been performed.  [name] 12/10/2011, 1:18 PM

## 2011-12-10 NOTE — Progress Notes (Signed)
CSW received referral for pt likely to return to SNF. Will follow to facilitate d/c planning as needed.  Baxter Flattery, MSW [phone]

## 2011-12-10 NOTE — Progress Notes (Signed)
ANTICOAGULATION CONSULT NOTE - Follow Up Consult  Pharmacy Consult for Coumadin Indication: aflutter  Allergies  Allergen Reactions  . Codeine     REACTION: nausea    Patient Measurements: Height: 5\' 4"  (162.6 cm) Weight: 201 lb 8 oz (91.4 kg) IBW/kg (Calculated) : 54.7  Adjusted Body Weight:   Vital Signs: Temp: 97.5 F (36.4 C) (12/26 0629) Temp src: Axillary (12/26 0629) BP: 150/76 mmHg (12/26 0629) Pulse Rate: 72  (12/26 0629)  Labs:  Basename 12/10/11 0500 12/09/11 0450 12/08/11 0644  HGB -- 10.6* --  HCT -- 33.1* --  PLT -- 177 --  APTT -- -- --  LABPROT 24.7* 28.3* 31.7*  INR 2.19* 2.60* 3.01*  HEPARINUNFRC -- -- --  CREATININE -- 1.03 --  CKTOTAL -- -- --  CKMB -- -- --  TROPONINI -- -- --   Estimated Creatinine Clearance: 42.2 ml/min (by C-G formula based on Cr of 1.03).   Assessment: Pharmacy System-Based Medication Review: ANTICOAG: 89yof on Coumadin for aflutter. INR (2.19) is therapeutic but dropped with reduced dose of 5mg . PTA regimen: 7mg  daily (elevated INR at admission). Patient is on prednisone which can increase effects of warfarin. - H/H and Plts stable - No bleeding reported  ID: PNA/COPD exac -complted 7 days Avelox. Afebrile, WBC 13.  Blood/Urine cxs no growth. Per pulm, no PNA.   NEURO: Depression/dementia - celexa, aricept  PULM: COPD Exac vs CHF Exac; 3L [place] 97%;  Prednisone reduced to 30mg  daily, continues albuterol nebs, furosemide  CARDS :hx HTN, lipidemia, afib/flutter, HF (EF50-55%);  BP 150/76 HR 72;  meds: Norvasc, bisoprolol, furosemide, atorvastatin - verapamil changed to bisoprolol to avoid 2 CCBs   GI/NUTRITIONl: Heart healthy diet  ENDO: Hypothyroidism - synthroid - no TSH. CBGs well controlled.  RENAL: SCr improving, K+ 4.3  HEMONC: H/o breast CA, H/H/plts stable - on PO iron   Goal of Therapy:  INR 2-3   Plan:  1. Coumadin 7mg  po x 1 today 2. Follow-up AM INR and Coumadin continuation discussion with  Cards  Cleon Dew [phone] 12/10/2011,9:47 AM

## 2011-12-10 NOTE — Progress Notes (Signed)
Name: [name] MRN: [id number] DOB: 08-28-1922    LOS: 9  PCCM Hosp f/u  PROGRESS NOTE  History of Present Illness: 77 y/o WF with COPD and CHF recently adm for pneumonia and discharged to nursing home brought to [facility] ED 12/17 with worsening nonproductive cough, lower extremities swelling and respiratory difficulty.  Found to be in hypercarbic respiratory failure, placed on BiPAP/ NCB status.  TODAY- No overnight issues. RN feels she is wheezing less. More mobile - with 1 person assist got into wheel chair and spent time with family. Getting lasix but bnp still 4K.  Cultures: 12/17 MRSA screen > neg 12/17  BC> neg  12/17 UC > neg   Antibiotics: 12/17  Vancomycin(HCAP)>>>12/18 12/17  Zosyn(HCAP)>>>12/18 1218  Moxi (AECOPD)> 12/23  Tests / Events: 12/17  Admitted for hypercarbic respiratory failure.  BiPAP started. 12/18  Off BiPAP, reports feeling better 12/24 - improved but still wheeze 12/26  - wheeze significantly better   Vital Signs: Temp:  [97.1 F (36.2 C)-97.9 F (36.6 C)] 97.5 F (36.4 C) (12/26 0629) Pulse Rate:  [71-73] 72  (12/26 0629) Resp:  [20] 20  (12/26 0629) BP: (120-150)/(66-76) 150/76 mmHg (12/26 0629) SpO2:  [92 %-98 %] 98 % (12/26 0629) FiO2 (%):  [4 %] 4 % (12/25 1420) Weight:  [91.4 kg (201 lb 8 oz)] 201 lb 8 oz (91.4 kg) (12/26 0629) I/O last 3 completed shifts: In: 1113 [P.O.:1110; I.V.:3] Out: 1900 [Urine:1900]  Physical Examination: General:  Comfortable, in no distress Neuro:  No deficits, alert, Patient confused though (?dementia) - Alert. GCS 15. RASS +1.  No choking HEENT:  PERRL Neck:  Cannot assess JVD   Cardiovascular:  RRR, no murmurs Lungs:  Bilateral faint  wheeze, unlabored - much improved since 12/24 Abdomen:  Soft, nontender, bowel sounds present Musculoskeletal:  Improved pitting LE edema Skin:  No rash  Vent Mode:  [-]  FiO2 (%):  [4 %] 4 %  Labs and Imaging:   Lab 12/09/11 0450 12/06/11 0640 12/05/11 0635  NA 134*  136 136  K 4.3 4.5 4.2  CL 94* 94* 94*  CO2 36* 37* 35*  BUN 21 32* 33*  CREATININE 1.03 1.33* 1.44*  GLUCOSE 111* 132* 130*    Lab 12/09/11 0450 12/06/11 0640 12/05/11 0635  HGB 10.6* 10.4* 10.5*  HCT 33.1* 32.3* 32.8*  WBC 13.0* 9.4 10.0  PLT 177 172 175      Assessment and Plan:  COPD exacerbation, pneumonia ruled out.   -->continue bronchodilators -  - > atrovent nebs scheduled. --> Prednisone reduce to 30 mg  Each day --BDs - continue o2 (is on baseline 2L [place])  CHF exacerbation - likely current cause of wheeze --> Improved wheeze with diuersis but 12/24 bnp still 4k Plan Check echo  Acute hypercarbic respiratory failure secondary to above, resolved -  Hypokalemia, secondary to diuretic Resolved  Atrial flutter / HTN -->continue Coumadin (will check with cards about safety of this with this patient) -->Verapamil changed to bisoprolol 12/23 to avoid two calcium channel blockers -->Norvasc  Hypothyroidism -->continue Levothyroxine  Dementia / depression -->Celexa, Aricept  Dyslipidemia -->Simvastatin  Deconditioning  - PT recommends back to SNF, Family intially prefered home with PT. On 12/24 - DR Marchelle Gearing spoke to d-i-l and she said family want patient to go back to [facility]   [employer] / Disposition --> floor status under PCCM; keep in hospital trhough 12/27 -  still wheezing though better = await further diuresis, cards  consult on safety of anticoagulation in this patient. -->DNR/DNI -->DVT Px is not indicated (Coumdain) -->Diet -->out of bed      Lavinia Sharps Pulmonary and Critical Care Medicine [facility] Pager [phone]

## 2011-12-10 NOTE — Progress Notes (Signed)
Physical Therapy Treatment Patient Details Name: [name] MRN: [id number] DOB: [birth year] Today's Date: 12/10/2011  PT Assessment/Plan  PT - Assessment/Plan Comments on Treatment Session: Pt ambulated well today and was min to moderate assist with mobility. Pt continues to be limited by weakness, decreased endurance, and desaturation with mobility. Pt had SpO2 = 96% at start of treatment on 3L [place] O2. Pt desaturated to  88-89% on 3L[place] O2 post ~20' ambulation and resaturated slowly to 92% on 3L O2. Pt ambulated again ~20 more ft and desaturated to ~84% SpO2 on 3L O2 which required 4LNCO2 to return to >90%. Pt reached SpO2 = 92% and she was returned to 3L O2. Although she is progressing, PT still recommending SNF to promote safe eventual return home.  PT Plan: Discharge plan remains appropriate PT Frequency: Min 2X/week Follow Up Recommendations: Skilled nursing facility Equipment Recommended: Defer to next venue PT Goals  Acute Rehab PT Goals PT Goal Formulation: With patient Time For Goal Achievement: 2 weeks Pt will go Supine/Side to Sit: with supervision;with HOB 0 degrees Pt will go Sit to Supine/Side: with supervision;with HOB 0 degrees Pt will go Sit to Stand: with supervision PT Goal: Sit to Stand - Progress: Progressing toward goal Pt will go Stand to Sit: with supervision PT Goal: Stand to Sit - Progress: Progressing toward goal Pt will Ambulate: 16 - 50 feet;with supervision;with least restrictive assistive device PT Goal: Ambulate - Progress: Progressing toward goal Pt will Perform Home Exercise Program: with supervision, verbal cues required/provided PT Goal: Perform Home Exercise Program - Progress: Progressing toward goal  PT Treatment Precautions/Restrictions  Precautions Precautions: Fall Precaution Comments: Secondary to weakness Restrictions Weight Bearing Restrictions: No Mobility (including Balance) Bed Mobility Bed Mobility: Yes Transfers Transfers:  Yes Sit to Stand: 4: Min assist;From chair/3-in-1 Stand to Sit: To chair/3-in-1;3: Mod assist Stand to Sit Details: Assist to control descent as pt "gives out" Verbal cues for UE placement.  Ambulation/Gait Ambulation/Gait: Yes Ambulation/Gait Assistance: 4: Min assist Ambulation/Gait Assistance Details (indicate cue type and reason): Additional person needed to assist with O2 tank and follow with chair. Min assist secondary to weakness. Verbal cues for negotiation of RW.  Ambulation Distance (Feet): 40 Feet (total: 20', seated rest, 20'. ) Assistive device: Rolling walker Gait Pattern: Trunk flexed;Decreased stride length    Exercise  General Exercises - Lower Extremity Ankle Circles/Pumps: AROM;Both;15 reps;Seated Long Arc Quad: AROM;Strengthening;Both;20 reps;Seated (light manual resistence. ) Heel Slides: AROM;Both;15 reps;Seated (light manual resistance) Hip Flexion/Marching: AROM;Both;10 reps;Seated End of Session PT - End of Session Equipment Utilized During Treatment: Gait belt Activity Tolerance: Patient limited by fatigue;Treatment limited secondary to medical complications (Comment) (limited by decr SaO2 and dyspnea) Patient left: in chair;with call bell in reach;with family/visitor present Nurse Communication: Mobility status for ambulation;Mobility status for transfers General Behavior During Session: [facility] for tasks performed Cognition: Impaired Cognitive Impairment: Decreased memory, decreased processing  Sherie Don 12/10/2011, 2:07 PM  Sherie Don) Carleene Mains PT, DPT Acute Rehabilitation [phone]

## 2011-12-11 DIAGNOSIS — J441 Chronic obstructive pulmonary disease with (acute) exacerbation: Secondary | ICD-10-CM

## 2011-12-11 DIAGNOSIS — I5031 Acute diastolic (congestive) heart failure: Secondary | ICD-10-CM

## 2011-12-11 DIAGNOSIS — J158 Pneumonia due to other specified bacteria: Secondary | ICD-10-CM

## 2011-12-11 DIAGNOSIS — J449 Chronic obstructive pulmonary disease, unspecified: Secondary | ICD-10-CM

## 2011-12-11 DIAGNOSIS — J9600 Acute respiratory failure, unspecified whether with hypoxia or hypercapnia: Secondary | ICD-10-CM

## 2011-12-11 LAB — PROTIME-INR: INR: 2.46 — ABNORMAL HIGH (ref 0.00–1.49)

## 2011-12-11 LAB — PRO B NATRIURETIC PEPTIDE: Pro B Natriuretic peptide (BNP): 2285 pg/mL — ABNORMAL HIGH (ref 0–450)

## 2011-12-11 LAB — BASIC METABOLIC PANEL
Calcium: 8.4 mg/dL (ref 8.4–10.5)
GFR calc non Af Amer: 47 mL/min — ABNORMAL LOW (ref >90)
Glucose, Bld: 107 mg/dL — ABNORMAL HIGH (ref 70–99)
Sodium: 137 mEq/L (ref 135–145)

## 2011-12-11 LAB — MAGNESIUM: Magnesium: 2.1 mg/dL (ref 1.5–2.5)

## 2011-12-11 MED ORDER — BISOPROLOL FUMARATE 5 MG PO TABS: 5.0000 mg | ORAL_TABLET | Freq: Every day | ORAL | Status: DC

## 2011-12-11 MED ORDER — PREDNISONE 10 MG PO TABS: ORAL_TABLET | ORAL | Status: DC

## 2011-12-11 MED ORDER — WARFARIN SODIUM 6 MG PO TABS
6.0000 mg | ORAL_TABLET | Freq: Once | ORAL | Status: AC
  Administered 2011-12-11: 6 mg via ORAL
  Filled 2011-12-11: qty 1

## 2011-12-11 MED ORDER — FUROSEMIDE 40 MG PO TABS: 40.0000 mg | ORAL_TABLET | Freq: Two times a day (BID) | ORAL | Status: DC

## 2011-12-11 NOTE — Discharge Summary (Signed)
Physician Discharge Summary  Patient ID: [name] MRN: [id number] DOB/AGE: 06/03/1922 77 y.o.  Admit date: 12/01/2011 Discharge date: 12/12/2011    Discharge Diagnoses:  1. AECOPD 2. AE CHF 3. Acute hypercarbic respiratory failure secondary to #1,2 4. Hypokalemia 5. A-flutter 6. HTN 7. Hypothyroidism 8. Dementia / Depression 9. Dyslipidemia 10. Deconditioning 11. Code Status:  DNR / DNI   Brief Summary: [name] is a 77 y.o. y/o female with a PMH of O2 dependent COPD, HLD, HTN, Hypothroidism, Dementia, A-flutter, CHF, CKD, Anxiety/Depression presented to [facility] ED on 12/17 from [facility] for worsening non-productive cough, lower extremity swelling and shortness of breath.  She was noted to be in acute hypercarbic respiratory fialure and placed on BiPAP for support.  Upon admission it was confirmed with [name]'s son that she would not want aggressive care to include intubation and mechanical ventilation / CPR in the event of emergency.  She recently had been discharged from [facility] 11/26 after an episode of acute hypoxic respiratory failure and acute renal failure.  Prior to that admission she had been on Lasix 40 mg twice a day.  In the setting of resolving renal failure she was discharged on a reduced dose of Lasix.  Upon current admission she was placed on empiric antibiotics for HCAP, nebulized bronchodilators, intravenous Lasix, and steroids.  She was transitioned from IV medications to by mouth without difficulty and continued to make improvement in status.  Serial creatinines were monitored in the setting of chronic renal insufficiency.  She had transient hypokalemia in the setting of diuretics which was monitored and repleted as needed.  Medication regimen for atrial flutter/hypertension was adjusted and at time of discharge she will continue on Norvasc and bisoprolol.  Consultation from Cardiology was requested during hospitalization in regards to Coumadin  therapy continuation and at this time recommendations are to continue current therapy at a reduced dose of 5 mg per day.  Is recommended to have a followup PT/INR on Monday 12/14/11 for coumadin adjustment.  Goal INR 2-3. Blood pressures were noted to be labile during hospitalization with current range of 130 to 170 systolic-she currently is on bisoprolol and Norvasc. She was maintained on home Celexa and Aricept during hospitalization and levothyroxine. At time of discharge [name] is awake and alert with significant clinical improvement.   She'll continue a short prednisone taper, transitioned back to by mouth Lasix, continue Coumadin therapy and baseline supplemental oxygen.    Cultures:  12/17 MRSA screen > neg  12/17 BC> neg  12/17 UC > neg   Antibiotics:  12/17 Vancomycin(HCAP)>>>12/18  12/17 Zosyn(HCAP)>>>12/18  1218 Moxi (AECOPD)> 12/23   Tests / Events:  12/17 Admitted for hypercarbic respiratory failure. BiPAP started.  12/18 Off BiPAP, reports feeling better  12/24 - improved but still wheeze  12/26 - wheeze significantly better   Filed Vitals:   12/11/11 1637 12/11/11 2037 12/11/11 2100 12/12/11 0558  BP:   142/68 159/76  Pulse:   76 70  Temp:   97.5 F (36.4 C) 97.2 F (36.2 C)  TempSrc:   Oral Oral  Resp:   20 20  Height:      Weight:    194 lb 14.2 oz (88.4 kg)  SpO2: 97% 96% 94% 98%     Discharge Exam: General: Comfortable, in no distress  Neuro: No deficits, alert, Patient confused though (baseline dementia) - Alert. GCS 15. RASS +1.  HEENT: PERRL  Cardiovascular: RRR, no murmurs  Lungs:  Bilateral faint wheeze, unlabored at rest  Abdomen: Soft, nontender, bowel sounds present  Musculoskeletal: Improved pitting LE edema  Skin: No rash   Discharge Labs BMET    Component Value Date/Time   NA 135 12/12/2011 0520   K 3.3* 12/12/2011 0520   CL 91* 12/12/2011 0520   CO2 39* 12/12/2011 0520   GLUCOSE 123* 12/12/2011 0520   BUN 19 12/12/2011 0520    CREATININE 1.04 12/12/2011 0520   CALCIUM 8.4 12/12/2011 0520   GFRNONAA 47* 12/12/2011 0520   GFRAA 54* 12/12/2011 0520   Lab Results  Component Value Date   WBC 13.0* 12/09/2011   HGB 10.6* 12/09/2011   HCT 33.1* 12/09/2011   MCV 93.2 12/09/2011   PLT 177 12/09/2011    Lab Results  Component Value Date   INR 2.83* 12/12/2011   INR 2.46* 12/11/2011   INR 2.19* 12/10/2011      [name], [name]  Home Medication Instructions [id number]   Printed on:12/12/11 0844  Medication Information                    ALPRAZolam (XANAX) 0.25 MG tablet Take 0.25 mg by mouth at bedtime as needed. For anxiety           zolpidem (AMBIEN) 10 MG tablet Take 10 mg by mouth at bedtime as needed. For sleep           donepezil (ARICEPT) 10 MG tablet Take 10 mg by mouth at bedtime.            levothyroxine (SYNTHROID, LEVOTHROID) 100 MCG tablet Take 100 mcg by mouth daily.             atorvastatin (LIPITOR) 20 MG tablet Take 20 mg by mouth at bedtime.            albuterol (PROAIR HFA) 108 (90 BASE) MCG/ACT inhaler Inhale 2 puffs into the lungs every 4 (four) hours as needed. For shortness of breath           ibuprofen (ADVIL,MOTRIN) 200 MG tablet Take 200 mg by mouth every 8 (eight) hours as needed. For pain           ferrous sulfate 325 (65 FE) MG tablet Take 325 mg by mouth daily with breakfast.             Calcium Carbonate-Vitamin D (OSCAL 500/200 D-3 PO) Take 1 tablet by mouth daily.             senna (SENOKOT) 8.6 MG tablet Take 2 tablets by mouth at bedtime.             tiotropium (SPIRIVA HANDIHALER) 18 MCG inhalation capsule Place 1 capsule (18 mcg total) into inhaler and inhale daily.           polyethylene glycol (MIRALAX / GLYCOLAX) packet Take 17 g by mouth daily.             citalopram (CELEXA) 20 MG tablet Take 20 mg by mouth.             amLODipine (NORVASC) 5 MG tablet Take 5 mg by mouth daily.             predniSONE (DELTASONE) 10 MG tablet 3 tabs daily  for 2 days, then 2 tabs daily for 2 days, then 1 tab daily for 2 days then stop           furosemide (LASIX) 40 MG tablet Take 1 tablet (40 mg total) by mouth  2 (two) times daily.           bisoprolol (ZEBETA) 5 MG tablet Take 1 tablet (5 mg total) by mouth daily.           warfarin (COUMADIN) 5 MG tablet Take 1 tablet (5 mg total) by mouth daily.                   Discharge Orders    Future Appointments: Provider: Department: Dept Phone: Center:   12/18/2011 1:30 PM [facility] [phone] None   01/14/2012 2:00 PM Shan Levans, MD [facility] [phone] None     Future Orders Please Complete By Expires   Diet - low sodium heart healthy      Increase activity slowly      Comments:   Per Instructions of PT   Discharge instructions      Comments:   Weigh patient daily.  If noted weight gain of 5 lbs in 5 days call MD.  (Patient may need adjustment in lasix dosing).   Nursing communication      Comments:   1. Review discharge instructions with patient and son. 2. Pt will need ambulance transfer to SNF   Nursing communication      Comments:   Patient will need PT/INR on 12/30 for follow up regarding coumadin dosing.       Follow-up Information    Follow up with Arvilla Meres, MD on 12/18/2011. (at 1:30p   [employer] Code [id number]))    Contact information:   [facility] [phone]       Follow up with Herb Grays, MD. (As needed)    Contact information:   [address] [place] [address] [phone]       Follow up with Shan Levans, MD on 01/14/2012. (at 2:00 pm)    Contact information:   [address] [facility] [address] [place] [address] [place] [address] [phone]          Disposition: Skilled Nursing Facility Discharged Condition: Tryphena KARIMAH WINQUIST has met maximum benefit of inpatient care and is medically stable and cleared  for discharge.  Patient is pending follow up as above.      Time spent on disposition:  Greater than 35 minutes.   Signed: Canary Brim, NP-C [facility] Pgr: [phone]

## 2011-12-11 NOTE — Progress Notes (Signed)
ANTICOAGULATION CONSULT NOTE - Follow Up Consult  Pharmacy Consult for Coumadin Indication: aflutter  Allergies  Allergen Reactions  . Codeine     REACTION: nausea    Patient Measurements: Height: 5\' 4"  (162.6 cm) Weight: 200 lb 9.9 oz (91 kg) IBW/kg (Calculated) : 54.7  Adjusted Body Weight:   Vital Signs: Temp: 97.1 F (36.2 C) (12/27 0630) Temp src: Oral (12/27 0630) BP: 170/78 mmHg (12/27 0630) Pulse Rate: 66  (12/27 0630)  Labs:  Basename 12/11/11 0525 12/10/11 0500 12/09/11 0450  HGB -- -- 10.6*  HCT -- -- 33.1*  PLT -- -- 177  APTT -- -- --  LABPROT 27.1* 24.7* 28.3*  INR 2.46* 2.19* 2.60*  HEPARINUNFRC -- -- --  CREATININE 1.03 -- 1.03  CKTOTAL -- -- --  CKMB -- -- --  TROPONINI -- -- --   Estimated Creatinine Clearance: 42 ml/min (by C-G formula based on Cr of 1.03).   Assessment: Pharmacy System-Based Medication Review: ANTICOAG: 89yof on Coumadin for aflutter. INR increased to 2.46) is therapeutic but increased after increasing dose last PM. Will decrease slightly today PTA regimen: 7mg  daily (elevated INR at admission). Patient is on prednisone which can increase effects of warfarin. - H/H and Plts stable - No bleeding reported  ID: PNA/COPD exac -complted 7 days Avelox. Afebrile, WBC 13.  Blood/Urine cxs no growth. Per pulm, no PNA.   NEURO: Depression/dementia - celexa, aricept  PULM: COPD Exac vs CHF Exac; 3L [place] 97%;  Prednisone reduced to 30mg  daily, continues albuterol nebs, furosemide  CARDS :hx HTN, lipidemia, afib/flutter, HF (EF50-55%);  BP 170/78 HR 66;  meds: Norvasc, bisoprolol, furosemide, atorvastatin - verapamil changed to bisoprolol to avoid 2 CCBs. Cont IV lasix today - will switch to po in AM per cards  GI/NUTRITION: Heart healthy diet  ENDO: Hypothyroidism - synthroid - no TSH. CBGs well controlled.  RENAL: SCr improving at 1.03 est crcl 42 ml/min, lytes ok  HEMONC: H/o breast CA, H/H/plts stable - on PO  iron   Goal of Therapy:  INR 2-3   Plan:  1. Coumadin 6 mg po x 1 today 2. Follow-up AM INR   Janace Litten, PharmD  12/11/2011,11:40 AM

## 2011-12-11 NOTE — Plan of Care (Signed)
Problem: Phase I Progression Outcomes Goal: Up in chair, BRP Outcome: Progressing Patient OOB to chair and bathroom with one person assist

## 2011-12-11 NOTE — Progress Notes (Signed)
Name: [name] MRN: [id number] DOB: 06-17-1922    LOS: 10  PCCM PROGRESS NOTE  History of Present Illness: 77 y/o WF with COPD and CHF recently adm for pneumonia and discharged to nursing home brought to [facility] ED 12/17 with worsening nonproductive cough, lower extremities swelling and respiratory difficulty.  Found to be in hypercarbic respiratory failure, placed on BiPAP/ NCB status.  TODAY- No overnight issues.  Pt concerned about foley cath being in place.    Cultures: 12/17 MRSA screen > neg 12/17  BC> neg  12/17 UC > neg   Antibiotics: 12/17  Vancomycin(HCAP)>>>12/18 12/17  Zosyn(HCAP)>>>12/18 1218  Moxi (AECOPD)> 12/23  Tests / Events: 12/17  Admitted for hypercarbic respiratory failure.  BiPAP started. 12/18  Off BiPAP, reports feeling better 12/24 - improved but still wheeze 12/26  - wheeze significantly better 12/27 - wheeze resolved with further diuresis, anticipate dc 12/28   Vital Signs: Temp:  [97.1 F (36.2 C)-98.7 F (37.1 C)] 97.1 F (36.2 C) (12/27 0630) Pulse Rate:  [66-80] 66  (12/27 0630) Resp:  [20] 20  (12/27 0630) BP: (124-170)/(59-78) 170/78 mmHg (12/27 0630) SpO2:  [84 %-98 %] 97 % (12/27 1003) Weight:  [200 lb 9.9 oz (91 kg)] 200 lb 9.9 oz (91 kg) (12/27 0630) I/O last 3 completed shifts: In: 968 [P.O.:960; I.V.:8] Out: 3376 [Urine:3375; Stool:1]  Physical Examination: General:  Comfortable, in no distress Neuro:  No deficits, alert, Patient confused though (baseline dementia) - Alert. GCS 15. RASS +1.   HEENT:  PERRL Cardiovascular:  RRR, no murmurs Lungs:  Bilateral faint  wheeze, unlabored at rest Abdomen:  Soft, nontender, bowel sounds present Musculoskeletal:  Improved pitting LE edema Skin:  No rash   Labs and Imaging:   Lab 12/11/11 0525 12/09/11 0450 12/06/11 0640  NA 137 134* 136  K 3.6 4.3 4.5  CL 93* 94* 94*  CO2 39* 36* 37*  BUN 21 21 32*  CREATININE 1.03 1.03 1.33*  GLUCOSE 107* 111* 132*    Lab 12/09/11 0450  12/06/11 0640 12/05/11 0635  HGB 10.6* 10.4* 10.5*  HCT 33.1* 32.3* 32.8*  WBC 13.0* 9.4 10.0  PLT 177 172 175      Assessment and Plan:  COPD exacerbation, pneumonia ruled out.   -->continue scheduled atrovent / albuterol nebs --> Prednisone 30 mg  -->will need taper at d/c -->continue o2 (is on baseline 2L [place])  CHF exacerbation - likely current cause of wheeze -->Improved wheeze with diuersis but 12/24 bnp still 4k -->Echo 12/26 with EF 65-70%, PA peak 38, LA mildly dilated - Seen by Cards 12/26: and their opinion is, "Currently has mild volume overload. Would resume IV lasix at 40 bid for 1-2 days. Ok if CR bumps slightly. On d/c would resume lasix 40 po bid and give them a firm weight range to protect and for [facility] to follow. I.e, "goal weight 190-195 pounds". If weight at goal take 40 bid. If weight above goal take an extra dose. We can help manage this through the HF clinic"       Acute hypercarbic respiratory failure secondary to above, resolved -Baseline O2 2L  Hypokalemia, secondary to diuretic Resolved  Atrial flutter / HTN -->continue Coumadin --Cardiology 12/26 recommends to continue at this time but with caveat that if she falls again (has had 2 falls at home but is currently with 24/7 assist and will be in [place]) to reconsider coumadin.  -->Verapamil changed to bisoprolol 12/23 to avoid two calcium channel  blockers -->Norvasc  Hypothyroidism -->continue Levothyroxine  Dementia / depression -->Celexa, Aricept  Dyslipidemia -->Simvastatin  Deconditioning  - PT recommends back to SNF.  Plan to d/c in am to [facility] with hopeful return to home after short say in [place] for PT/OT.  Mobilize, remove foley.   Best practices / Disposition -->DNR/DNI -->DVT Px is not indicated (Coumdain) -->Diet -->out of bed    Canary Brim, NP-C [place] [facility] Pgr: [phone]   STAFF NOTE: I, Dr Lavinia Sharps have personally reviewed  patient's available data, including medical history, events of note, physical examination and test results as part of my evaluation. I have discussed with resident/NP and other care providers such as pharmacist, RN and RRT.  In addition,  I personally evaluated patient and elicited key findings of wheezing related to heart failure that has improved/resolved today 12/27. Plan to dc to [facility] 12/28./ She will get monitoring of weight from heart faiure clinic. For now continue coumadin but if falls again coumadin to be stopped per cards.  Rest per NP/medical resident whose note is outlined above and that I agree with  T

## 2011-12-11 NOTE — Progress Notes (Signed)
CSW spoke with pt son and confirmed plan for d/c back to [facility] tomorrow. CSW has advised facility and will follow to facilitate d/c.  Baxter Flattery, MSW [phone]

## 2011-12-11 NOTE — Progress Notes (Signed)
   SUBJECTIVE:  She thinks that her breathing is better.  No pain.   PHYSICAL EXAM Filed Vitals:   12/10/11 1354 12/10/11 2038 12/10/11 2100 12/11/11 0630  BP:   124/59 170/78  Pulse:   80 66  Temp:   97.3 F (36.3 C) 97.1 F (36.2 C)  TempSrc:   Oral Oral  Resp:   20 20  Height:      Weight:    91 kg (200 lb 9.9 oz)  SpO2: 94% 93% 96% 98%   General:  Still with tachypnea Lungs:  Diffuse expiratory wheezing Heart:  Distant heart sounds Abdomen:  Positive bowel sounds, no rebound no guarding Extremities:  Mild edema  LABS: Lab Results  Component Value Date   CKTOTAL 32 12/02/2011   CKMB 2.3 12/02/2011   TROPONINI <0.30 12/02/2011   Results for orders placed during the hospital encounter of 12/01/11 (from the past 24 hour(s))  PROTIME-INR     Status: Abnormal   Collection Time   12/11/11  5:25 AM      Component Value Range   Prothrombin Time 27.1 (*) 11.6 - 15.2 (seconds)   INR 2.46 (*) 0.00 - 1.49   BASIC METABOLIC PANEL     Status: Abnormal   Collection Time   12/11/11  5:25 AM      Component Value Range   Sodium 137  135 - 145 (mEq/L)   Potassium 3.6  3.5 - 5.1 (mEq/L)   Chloride 93 (*) 96 - 112 (mEq/L)   CO2 39 (*) 19 - 32 (mEq/L)   Glucose, Bld 107 (*) 70 - 99 (mg/dL)   BUN 21  6 - 23 (mg/dL)   Creatinine, Ser 0.86  0.50 - 1.10 (mg/dL)   Calcium 8.4  8.4 - 57.8 (mg/dL)   GFR calc non Af Amer 47 (*) >90 (mL/min)   GFR calc Af Amer 55 (*) >90 (mL/min)  MAGNESIUM     Status: Normal   Collection Time   12/11/11  5:25 AM      Component Value Range   Magnesium 2.1  1.5 - 2.5 (mg/dL)  PRO B NATRIURETIC PEPTIDE     Status: Abnormal   Collection Time   12/11/11  5:25 AM      Component Value Range   Pro B Natriuretic peptide (BNP) 2285.0 (*) 0 - 450 (pg/mL)    Intake/Output Summary (Last 24 hours) at 12/11/11 0844 Last data filed at 12/11/11 0545  Gross per 24 hour  Intake    485 ml  Output   2751 ml  Net  -2266 ml    ASSESSMENT AND PLAN:  1)  Acute  on chronic diastolic HF:  I will continue the IV Lasix today and switch to PO in the am.  Check BMET in the am.   2) HTN:  BP is labile.  Continue current meds for now.  3) Atrial Fib:  Per Dr. Prescott Gum note (and I agree) she should remain on Coumadin.    Fayrene Fearing [facility] 12/11/2011 8:44 AM

## 2011-12-12 LAB — BASIC METABOLIC PANEL
BUN: 19 mg/dL (ref 6–23)
CO2: 39 mEq/L — ABNORMAL HIGH (ref 19–32)
GFR calc non Af Amer: 47 mL/min — ABNORMAL LOW (ref >90)
Glucose, Bld: 123 mg/dL — ABNORMAL HIGH (ref 70–99)
Potassium: 3.3 mEq/L — ABNORMAL LOW (ref 3.5–5.1)

## 2011-12-12 MED ORDER — WARFARIN SODIUM 5 MG PO TABS: 5.0000 mg | ORAL_TABLET | Freq: Every day | ORAL | Status: DC

## 2011-12-12 MED ORDER — WARFARIN SODIUM 5 MG PO TABS
5.0000 mg | ORAL_TABLET | Freq: Once | ORAL | Status: DC
  Filled 2011-12-12: qty 1

## 2011-12-12 NOTE — Progress Notes (Signed)
ANTICOAGULATION CONSULT NOTE - Follow Up Consult  Pharmacy Consult for Coumadin Indication: aflutter  Allergies  Allergen Reactions  . Codeine     REACTION: nausea    Patient Measurements: Height: 5\' 4"  (162.6 cm) Weight: 194 lb 14.2 oz (88.4 kg) IBW/kg (Calculated) : 54.7  Adjusted Body Weight:   Vital Signs: Temp: 97.2 F (36.2 C) (12/28 0558) Temp src: Oral (12/28 0558) BP: 159/76 mmHg (12/28 0558) Pulse Rate: 70  (12/28 0558)  Labs:  Alvira Philips 12/12/11 0520 12/11/11 0525 12/10/11 0500  HGB -- -- --  HCT -- -- --  PLT -- -- --  APTT -- -- --  LABPROT 30.2* 27.1* 24.7*  INR 2.83* 2.46* 2.19*  HEPARINUNFRC -- -- --  CREATININE 1.04 1.03 --  CKTOTAL -- -- --  CKMB -- -- --  TROPONINI -- -- --   Estimated Creatinine Clearance: 41 ml/min (by C-G formula based on Cr of 1.04).   Assessment: Pharmacy System-Based Medication Review:  ANTICOAG: 89yof on Coumadin for aflutter. INR increased to 2.83) is therapeutic. PTA regimen: 7mg  daily (elevated INR at admission). Patient is on prednisone which can increase effects of warfarin. - H/H and Plts stable - No bleeding reported  ID: PNA/COPD exac -complted 7 days Avelox. Afebrile, WBC 13.  Blood/Urine cxs no growth. Per pulm, no PNA.   NEURO: Depression/dementia - celexa, aricept  PULM: COPD Exac vs CHF Exac; 3L [place] 94%;  Prednisone reduced to 30mg  daily, continues albuterol nebs, furosemide  CARDS :hx HTN, lipidemia, afib/flutter, AE CHF (EF50-55%);  BP 159/76; HR 70  Meds: Norvasc, bisoprolol, furosemide, atorvastatin - verapamil changed to bisoprolol to avoid 2 CCBs.  Continues on IV lasix today.  GI/NUTRITION: Heart healthy diet  ENDO: Hypothyroidism - synthroid - no TSH. CBGs well controlled.  RENAL: SCr improving at 1.04 est crcl 42 ml/min, K = 3.3 today, no orders for replacement yet  HEMONC: H/o breast CA, H/H/plts stable - on PO iron   Goal of Therapy:  INR 2-3   Plan:  1. Coumadin 5 mg po x 1  today.  Recommend ~ 5mg  of Coumadin daily at discharge, but will need INR in 1-2 days. 2. Follow-up AM INR if pt not discharged today.  Valor Quaintance, Gwenlyn Found, PharmD  12/12/2011,10:23 AM

## 2011-12-12 NOTE — Progress Notes (Signed)
CSW contacted [facility] and they have a bed available. CSW met with pt. And pt's son to confirm the transfer to [facility]. They are agreeable to this. CSW will continue to facilitate d/c

## 2011-12-12 NOTE — Progress Notes (Signed)
Patient ID: [name], female   DOB: [birth year], 77 y.o.   MRN: [id number]   Patient is being discharged to SNF.  Change Lasix to po, continue warfarin as per Dr. Jenene Slicker note yesterday.  Needs K replacement.   [name] 12/12/2011 12:34 PM

## 2011-12-12 NOTE — Progress Notes (Signed)
   CARE MANAGEMENT NOTE 12/12/2011  Patient:  [name], [name]   Account Number:  [id number]  Date Initiated:  12/03/2011  Documentation initiated by:  [facility]  Subjective/Objective Assessment:   resp failure -     Action/Plan:   PTA, PT HAS RESIDED AT [facility] X 2 WEEKS FOR REHAB.   Anticipated DC Date:  12/08/2011   Anticipated DC Plan:  SKILLED NURSING FACILITY  In-house referral  Clinical Social Worker      DC Planning Services  CM consult      Choice offered to / List presented to:             Status of service:  Completed, signed off Medicare Important Message given?   (If response is "NO", the following Medicare IM given date fields will be blank) Date Medicare IM given:   Date Additional Medicare IM given:    Discharge Disposition:  SKILLED NURSING FACILITY  Per UR Regulation:  Reviewed for med. necessity/level of care/duration of stay  Comments:  12/12/11 Pietrina Jagodzinski,RN,BSN PT DISCHARGED BACK TO [facility] TODAY, PER CSW ARRANGEMENTS. Phone #[phone]    12/05/11 Mariyana Fulop,RN,BSN 1330 PHYSICAL THERAPIST IS RECOMMENDING THAT PT RETURN TO [facility] FOR REHAB PRIOR TO RETURNING HOME WITH FAMILY.  SHE IS DIAPPOINTED, BUT WANTS TO DO THE "RIGHT THING" AND UNDERSTANDS.  NOTIFIED DR ALVA OF CHANGE IN DISPOSITION. WILL CONSULT CSW TO NOTIFY FACILITY THAT PT WILL BE RETURNING, PERHAPS MONDAY, IF REMAINS STABLE OVER THE WEEKEND. Phone #[phone]   12/05/11 Lashica Hannay,RN,BSN 1100 MET WITH PT AND FAMILY TO DISCUSS DISCHARGE PLANS.  FAMILY CONSIDERING TAKING PT HOME, AND NOT RETURNING TO SNF AT DISCHARGE, IF RECOMMENDED BY PHYSICAL THERAPIST.  PT WOULD NEED HOME HEALTH RN, PT, AND OT.  SHE IS ON CHRONIC O2, AND HAS HOME O2 PROVIDED BY [facility].  PT ALREADY OWNS RW, BSC, AND TUB BENCH.  SHE WOULD BENEFIT FROM HOSPITAL BED AT DISCHARGE.  PT TO WORK WITH PT SHORTLY.  WILL FOLLOW FOR RECOMMENDATIONS. Phone #[phone]    12-03-11 2:40pm Avie Arenas,  RNBSN [phone] UR completed.

## 2011-12-12 NOTE — Progress Notes (Signed)
PT Cancellation Note  Treatment cancelled as pt is going to be D/Ced to SNF soon. Pt declining treatment this morning. Wishing her best of luck!  [name] ([name]) Carleene Mains PT, DPT Acute Rehabilitation [phone]

## 2011-12-18 ENCOUNTER — Ambulatory Visit (HOSPITAL_COMMUNITY)
Admission: RE | Admit: 2011-12-18 | Discharge: 2011-12-18 | Disposition: A | Discharge status: Home / Self Care | Payer: Medicare Other | Source: Ambulatory Visit | Attending: Family Medicine | Admitting: Family Medicine

## 2011-12-18 DIAGNOSIS — I5032 Chronic diastolic (congestive) heart failure: Secondary | ICD-10-CM | POA: Insufficient documentation

## 2011-12-18 NOTE — Progress Notes (Signed)
Cardiologist: Dr. Graciela Husbands   HPI: [name] is a 78 y.o. female with PMHx significant for COPD, atrial flutter (anticoagulated on Coumadin, rate-controlled on Verapamil outpatient), diastolic congestive heart failure, EF 50% to 55%.  Echo showed wall motion was normal ;there were no regional wall motion abnormalities, mild AS, MAC, mild LAE, mild RAE, PASP 35, mild pulmonary HTN.  HTN, and HL.  She was recently discharged to [facility] on 11/26 after admit for PNA.  She was then readmitted to [facility] hospital with acute respiratory failure secondary to COPD and CHF exacerbation.  Diuresed 8lbs.  Discharged 12/28 and doing well.    Being seen for post hospital follow up today with her daughter.  She has been at [facility].  Weight stable since discharge, 188.  She has a non productive cough that just started today.  Although the daughter says she has had this.  She has baseline dementia but her confusion is much better.  She says she feels a lot better.  No orthopnea/PND.  No syncope.  She is ready to leave [facility].  They have a 24 hour sitter set up for after discharge from [facility], due to her dementia.     ROS: All systems negative except as listed in HPI, PMH and Problem List.  Past Medical History  Diagnosis Date  . COPD (chronic obstructive pulmonary disease)   . Hyperlipidemia   . Hypertension   . Osteoporosis   . Hypothyroid   . Memory loss   . Anemia     Presumed GI on iron replacement hemoglobin in the 10 range  . Atrial flutter     Coumadin initiated 7/12 and followed by PCP; patient felt to be high risk for RFCA secondary to COPD;  echo was done 7/30:  EF 50% to 55%. Wall motion was normal ;there were no regional wall motion abnormalities, mild AS, MAC, mild LAE, mild RAE, PASP 35, mild pulmonary HTN  . On home O2   . Anxiety   . Depression   . CHF (congestive heart failure)   . Emphysema   . Chronic kidney disease   . Breast cancer   . DEMENTIA 11/04/11    "she has  some significant dementia"    Current Outpatient Prescriptions  Medication Sig Dispense Refill  . albuterol (PROAIR HFA) 108 (90 BASE) MCG/ACT inhaler Inhale 2 puffs into the lungs every 4 (four) hours as needed. For shortness of breath      . ALPRAZolam (XANAX) 0.25 MG tablet Take 0.25 mg by mouth at bedtime as needed. For anxiety      . amLODipine (NORVASC) 5 MG tablet Take 5 mg by mouth daily.        Marland Kitchen atorvastatin (LIPITOR) 20 MG tablet Take 20 mg by mouth at bedtime.       . bisoprolol (ZEBETA) 5 MG tablet Take 1 tablet (5 mg total) by mouth daily.      . Calcium Carbonate-Vitamin D (OSCAL 500/200 D-3 PO) Take 1 tablet by mouth daily.        . citalopram (CELEXA) 20 MG tablet Take 20 mg by mouth.        . donepezil (ARICEPT) 10 MG tablet Take 10 mg by mouth at bedtime.       . ferrous sulfate 325 (65 FE) MG tablet Take 325 mg by mouth daily with breakfast.        . furosemide (LASIX) 40 MG tablet Take 1 tablet (40 mg total) by mouth  2 (two) times daily.  30 tablet    . ibuprofen (ADVIL,MOTRIN) 200 MG tablet Take 200 mg by mouth every 8 (eight) hours as needed. For pain      . levothyroxine (SYNTHROID, LEVOTHROID) 100 MCG tablet Take 100 mcg by mouth daily.        . polyethylene glycol (MIRALAX / GLYCOLAX) packet Take 17 g by mouth daily.        . predniSONE (DELTASONE) 10 MG tablet 3 tabs daily for 2 days, then 2 tabs daily for 2 days, then 1 tab daily for 2 days then stop  12 tablet  0  . senna (SENOKOT) 8.6 MG tablet Take 2 tablets by mouth at bedtime.        Marland Kitchen tiotropium (SPIRIVA HANDIHALER) 18 MCG inhalation capsule Place 1 capsule (18 mcg total) into inhaler and inhale daily.  30 capsule  6  . warfarin (COUMADIN) 5 MG tablet Take 1 tablet (5 mg total) by mouth daily.      Marland Kitchen zolpidem (AMBIEN) 10 MG tablet Take 10 mg by mouth at bedtime as needed. For sleep         PHYSICAL EXAM: . Filed Vitals:   12/18/11 1339  BP: 128/78  Pulse: 54  Weight: 183 lb 1.9 oz (83.063 kg)  SpO2:  94%   General:  Well appearing. No resp difficulty HEENT: normal Neck: supple. JVP 9-10. Carotids 2+ bilaterally; no bruits. No lymphadenopathy or thryomegaly appreciated. Cor: PMI normal. Regular rate & rhythm. Soft AS murmer.  No rubs, gallops or murmurs. Lungs: clear Abdomen: soft, nontender, nondistended. No hepatosplenomegaly. No bruits or masses. Good bowel sounds. Extremities: no cyanosis, clubbing, rash, 1-2+ pitting bilateral lower extremity edema Neuro: alert & orientedx3, cranial nerves grossly intact. Moves all 4 extremities w/o difficulty. Affect pleasant.    ASSESSMENT & PLAN:

## 2011-12-18 NOTE — Patient Instructions (Signed)
Written on [employer].

## 2011-12-18 NOTE — Assessment & Plan Note (Signed)
Discussed the role of the Heart Failure clinic in full.  Fluid status remains up since discharge although weight is stable.  NYHA III.  Will increase lasix 80/40 mg daily.  Check BMET tomorrow and fax to clinic.  Will add compression hose during the day.  Have instructed [facility] to weigh daily and call the office if weight up 3 lbs in 24 hours or 5 lbs in 1 week.  Have changed diet to 2g sodium limit.  Will have her f/u in 2 weeks.

## 2011-12-22 ENCOUNTER — Encounter (HOSPITAL_COMMUNITY): Payer: Medicare Other

## 2011-12-22 NOTE — Discharge Summary (Signed)
Care of patient reviewed on day of discharge. She is a frail, copd and chf patient. This admission seemed more chf and needed lot of diuresis. Very frail and family felt SNF was more appropriate as opposed to go back. Rest per NP

## 2011-12-23 ENCOUNTER — Telehealth: Payer: Self-pay | Admitting: Critical Care Medicine

## 2011-12-23 NOTE — Telephone Encounter (Signed)
If pt is doing well and wearing BIPAP , that is okay.  Do not typically do daily bmet for chronic pt unless change in pt condition so for now tell SNF to cont with same rx  Please send message to Dr. Delford Field  To address tomorrow in case their he has more to add.

## 2011-12-23 NOTE — Telephone Encounter (Signed)
Have received fax and since PW was not in was given to TP to look at . Please advise [name], thanks

## 2011-12-23 NOTE — Telephone Encounter (Signed)
Unclear where this is coming from  According from chart -she was just admitted and last  PCO2 was 60 , so 44 would be better.  So is she at another facility , or did she go somewhere to get ABG or is a doctor calling, or is a home health nurse calling or DME calling ???

## 2011-12-23 NOTE — Telephone Encounter (Signed)
Pt is at [facility].

## 2011-12-23 NOTE — Telephone Encounter (Signed)
ATC [facility] and was transferred 4 different times the phone rang several times and no one ever answered the phone. Will call back in the AM to speak with someone and also forward to Dr. Delford Field as well. Please advise thanks

## 2011-12-23 NOTE — Telephone Encounter (Signed)
I spoke with [name] and she states the reason a BMET is being drawn daily is to cover the basis since in the beginning it was high. Also pt is doing well. She states she is up, talking, doing therapy and is on continuous oxygen. Pt does use bipap at night every night. Dr. Chilton Si and Dr. Rhunette Croft are the one's who see pt their. [name] stated they are not sure what the plan is yet and is they are requesting recs from our office. Please advise [name], thanks

## 2011-12-23 NOTE — Telephone Encounter (Signed)
Just reported she is at [facility] and labs were faxed over Unclear if pt 's status is changed  They are doing daily bmet ? Reason  And bicarb level is around 65-44 -she was just admitted with hypercarbic resp failure with PCO2 at 79  It is climbing but need to know if clinically she is deterioating  ? On bipap or is she endstage or what is plan Is she being seen by NH doc or are we managing her care.

## 2011-12-24 NOTE — Telephone Encounter (Signed)
PW, please advise if you have further recs before we attempt to call back the NF. Labs will be placed in your lookat.

## 2011-12-24 NOTE — Telephone Encounter (Signed)
[name] returned call.  Advised of PEW's recs re: CO2 levels.  [name] verbalized her understanding.

## 2011-12-24 NOTE — Telephone Encounter (Signed)
This pt has chronic resp failure and I expect the co2 to be this high Nothing else to offer

## 2011-12-24 NOTE — Telephone Encounter (Signed)
Called spoke with Jan (receptionist) at [facility], [place] for pt's nurse TCB x1.

## 2012-01-01 ENCOUNTER — Ambulatory Visit (HOSPITAL_COMMUNITY)
Admission: RE | Admit: 2012-01-01 | Discharge: 2012-01-01 | Disposition: A | Discharge status: Home / Self Care | Payer: Medicare Other | Source: Ambulatory Visit | Attending: Internal Medicine | Admitting: Internal Medicine

## 2012-01-01 VITALS — BP 108/62 | HR 58 | Wt 187.1 lb

## 2012-01-01 DIAGNOSIS — I5032 Chronic diastolic (congestive) heart failure: Secondary | ICD-10-CM

## 2012-01-01 DIAGNOSIS — I509 Heart failure, unspecified: Secondary | ICD-10-CM

## 2012-01-01 DIAGNOSIS — I359 Nonrheumatic aortic valve disorder, unspecified: Secondary | ICD-10-CM

## 2012-01-01 LAB — DIFFERENTIAL
Basophils Absolute: 0 10*3/uL (ref 0.0–0.1)
Basophils Relative: 0 % (ref 0–1)
Eosinophils Absolute: 0 10*3/uL (ref 0.0–0.7)
Eosinophils Relative: 1 % (ref 0–5)
Lymphocytes Relative: 9 % — ABNORMAL LOW (ref 12–46)
Lymphs Abs: 0.5 10*3/uL — ABNORMAL LOW (ref 0.7–4.0)
Monocytes Absolute: 0.8 10*3/uL (ref 0.1–1.0)
Monocytes Relative: 15 % — ABNORMAL HIGH (ref 3–12)
Neutro Abs: 4.3 10*3/uL (ref 1.7–7.7)
Neutrophils Relative %: 76 % (ref 43–77)

## 2012-01-01 LAB — CBC
HCT: 30.8 % — ABNORMAL LOW (ref 36.0–46.0)
Hemoglobin: 9.5 g/dL — ABNORMAL LOW (ref 12.0–15.0)
MCH: 30.3 pg (ref 26.0–34.0)
MCHC: 30.8 g/dL (ref 30.0–36.0)
MCV: 98.1 fL (ref 78.0–100.0)
Platelets: 151 10*3/uL (ref 150–400)
RBC: 3.14 MIL/uL — ABNORMAL LOW (ref 3.87–5.11)
RDW: 15 % (ref 11.5–15.5)
WBC: 5.6 10*3/uL (ref 4.0–10.5)

## 2012-01-01 LAB — BASIC METABOLIC PANEL WITH GFR
BUN: 18 mg/dL (ref 6–23)
CO2: 42 meq/L (ref 19–32)
Calcium: 9.9 mg/dL (ref 8.4–10.5)
Chloride: 92 meq/L — ABNORMAL LOW (ref 96–112)
Creatinine, Ser: 0.91 mg/dL (ref 0.50–1.10)
GFR calc Af Amer: 64 mL/min — ABNORMAL LOW
GFR calc non Af Amer: 55 mL/min — ABNORMAL LOW
Glucose, Bld: 130 mg/dL — ABNORMAL HIGH (ref 70–99)
Potassium: 4.3 meq/L (ref 3.5–5.1)
Sodium: 141 meq/L (ref 135–145)

## 2012-01-01 MED ORDER — ATORVASTATIN CALCIUM 20 MG PO TABS: 20.0000 mg | ORAL_TABLET | Freq: Every day | ORAL | Status: AC

## 2012-01-01 MED ORDER — AMLODIPINE BESYLATE 5 MG PO TABS: 5.0000 mg | ORAL_TABLET | Freq: Every day | ORAL | Status: DC

## 2012-01-01 MED ORDER — FUROSEMIDE 40 MG PO TABS: 40.0000 mg | ORAL_TABLET | Freq: Two times a day (BID) | ORAL | Status: DC

## 2012-01-01 MED ORDER — WARFARIN SODIUM 5 MG PO TABS: 5.0000 mg | ORAL_TABLET | Freq: Every day | ORAL | Status: DC

## 2012-01-01 MED ORDER — BISOPROLOL FUMARATE 5 MG PO TABS: 5.0000 mg | ORAL_TABLET | Freq: Every day | ORAL | Status: AC

## 2012-01-01 NOTE — Assessment & Plan Note (Signed)
Stable

## 2012-01-01 NOTE — Assessment & Plan Note (Signed)
Doing very well. Volume status much improved. Continue current regimen. Check labs.

## 2012-01-01 NOTE — Progress Notes (Signed)
HPI:  [name] is a 78 y.o. female with PMHx significant for COPD, atrial flutter (anticoagulated on Coumadin, rate-controlled on Verapamil outpatient), diastolic congestive heart failure, EF 50% to 55%. Echo showed wall motion was normal ;there were no regional wall motion abnormalities, mild AS, MAC, mild LAE, mild RAE, PASP 35, mild pulmonary HTN. HTN, and HL.   She was recently discharged to [facility] on 11/26 after admit for PNA. She was then readmitted to [facility] hospital with acute respiratory failure secondary to COPD and CHF exacerbation. Diuresed 8lbs. Discharged 12/28 and doing well. Discharged from [facility] to home on 12/25/11.   She returns for follow up with her daughter, [name] and 24 hour caregiver, [name]. Her weights have been a steady 187. No CP/SOB/orthopnea or PND. Continue with her home O2. +fatigue  ROS: All systems negative except as listed in HPI, PMH and Problem List.  Past Medical History  Diagnosis Date  . COPD (chronic obstructive pulmonary disease)   . Hyperlipidemia   . Hypertension   . Osteoporosis   . Hypothyroid   . Memory loss   . Anemia     Presumed GI on iron replacement hemoglobin in the 10 range  . Atrial flutter     Coumadin initiated 7/12 and followed by PCP; patient felt to be high risk for RFCA secondary to COPD;  echo was done 7/30:  EF 50% to 55%. Wall motion was normal ;there were no regional wall motion abnormalities, mild AS, MAC, mild LAE, mild RAE, PASP 35, mild pulmonary HTN  . On home O2   . Anxiety   . Depression   . CHF (congestive heart failure)   . Emphysema   . Chronic kidney disease   . Breast cancer   . DEMENTIA 11/04/11    "she has some significant dementia"    Current Outpatient Prescriptions  Medication Sig Dispense Refill  . albuterol (PROAIR HFA) 108 (90 BASE) MCG/ACT inhaler Inhale 2 puffs into the lungs every 4 (four) hours as needed. For shortness of breath      . ALPRAZolam (XANAX) 0.25 MG tablet Take 0.25 mg by  mouth at bedtime as needed. For anxiety      . amLODipine (NORVASC) 5 MG tablet Take 1 tablet (5 mg total) by mouth daily.  30 tablet  6  . atorvastatin (LIPITOR) 20 MG tablet Take 1 tablet (20 mg total) by mouth at bedtime.  30 tablet  6  . bisoprolol (ZEBETA) 5 MG tablet Take 1 tablet (5 mg total) by mouth daily.  30 tablet  6  . Calcium Carbonate-Vitamin D (OSCAL 500/200 D-3 PO) Take 1 tablet by mouth daily.        . citalopram (CELEXA) 20 MG tablet Take 20 mg by mouth.        . donepezil (ARICEPT) 10 MG tablet Take 10 mg by mouth at bedtime.       . ferrous sulfate 325 (65 FE) MG tablet Take 325 mg by mouth daily with breakfast.        . ibuprofen (ADVIL,MOTRIN) 200 MG tablet Take 200 mg by mouth every 8 (eight) hours as needed. For pain      . levothyroxine (SYNTHROID, LEVOTHROID) 100 MCG tablet Take 100 mcg by mouth daily.        . polyethylene glycol (MIRALAX / GLYCOLAX) packet Take 17 g by mouth daily.        . potassium chloride SA (KLOR-CON M20) 20 MEQ tablet Take 20 mEq by  mouth daily.      Marland Kitchen senna (SENOKOT) 8.6 MG tablet Take 2 tablets by mouth at bedtime.        Marland Kitchen tiotropium (SPIRIVA HANDIHALER) 18 MCG inhalation capsule Place 1 capsule (18 mcg total) into inhaler and inhale daily.  30 capsule  6  . warfarin (COUMADIN) 5 MG tablet Take 1 tablet (5 mg total) by mouth daily.      Marland Kitchen zolpidem (AMBIEN) 10 MG tablet Take 10 mg by mouth at bedtime as needed. For sleep      . furosemide (LASIX) 40 MG tablet Take 1 tablet (40 mg total) by mouth 2 (two) times daily. Take 80 mg in the AM and 40 mg in the PM  90 tablet  6     PHYSICAL EXAM: Filed Vitals:   01/01/12 1120  BP: 108/62  Pulse: 58  General: elderly sitting in WC. Lethargic but wakes up easilyNo resp difficulty  HEENT: normal  Neck: supple. JVP 8 Carotids 2+ bilaterally; no bruits. No lymphadenopathy or thryomegaly appreciated.  Cor: PMI normal. Irregular murmer. No rubs, gallops or murmurs.  Lungs: clear  Abdomen: soft,  nontender, nondistended. No hepatosplenomegaly. No bruits or masses. Good bowel sounds.  Extremities: no cyanosis, clubbing, rash, tr-1+ bilateral lower extremity edema. +TED hose Neuro: alert & orientedx3, cranial nerves grossly intact. Moves all 4 extremities w/o difficulty. Affect pleasant.    ASSESSMENT & PLAN:

## 2012-01-01 NOTE — Patient Instructions (Signed)
Continue current medications.  Labs today.  Follow up 6 weeks.  Do the following things EVERYDAY: 1) Weigh yourself in the morning before breakfast. Write it down and keep it in a log. 2) Take your medicines as prescribed 3) Eat low salt foods-Limit salt (sodium) to 2000mg  per day.  4) Stay as active as you can everyday

## 2012-01-14 ENCOUNTER — Inpatient Hospital Stay: Payer: Medicare Other | Admitting: Critical Care Medicine

## 2012-01-15 ENCOUNTER — Emergency Department (HOSPITAL_COMMUNITY)
Admission: EM | Admit: 2012-01-15 | Discharge: 2012-01-15 | Disposition: A | Discharge status: Home / Self Care | Payer: Medicare Other | Attending: Emergency Medicine | Admitting: Emergency Medicine

## 2012-01-15 ENCOUNTER — Encounter: Payer: Self-pay | Admitting: Critical Care Medicine

## 2012-01-15 ENCOUNTER — Emergency Department (HOSPITAL_COMMUNITY): Payer: Medicare Other

## 2012-01-15 ENCOUNTER — Encounter (HOSPITAL_COMMUNITY): Payer: Self-pay | Admitting: Emergency Medicine

## 2012-01-15 DIAGNOSIS — I129 Hypertensive chronic kidney disease with stage 1 through stage 4 chronic kidney disease, or unspecified chronic kidney disease: Secondary | ICD-10-CM | POA: Insufficient documentation

## 2012-01-15 DIAGNOSIS — J4489 Other specified chronic obstructive pulmonary disease: Secondary | ICD-10-CM | POA: Insufficient documentation

## 2012-01-15 DIAGNOSIS — F0390 Unspecified dementia without behavioral disturbance: Secondary | ICD-10-CM | POA: Insufficient documentation

## 2012-01-15 DIAGNOSIS — M810 Age-related osteoporosis without current pathological fracture: Secondary | ICD-10-CM | POA: Insufficient documentation

## 2012-01-15 DIAGNOSIS — Z853 Personal history of malignant neoplasm of breast: Secondary | ICD-10-CM | POA: Insufficient documentation

## 2012-01-15 DIAGNOSIS — E785 Hyperlipidemia, unspecified: Secondary | ICD-10-CM | POA: Insufficient documentation

## 2012-01-15 DIAGNOSIS — J449 Chronic obstructive pulmonary disease, unspecified: Secondary | ICD-10-CM | POA: Insufficient documentation

## 2012-01-15 DIAGNOSIS — Z79899 Other long term (current) drug therapy: Secondary | ICD-10-CM | POA: Insufficient documentation

## 2012-01-15 DIAGNOSIS — F341 Dysthymic disorder: Secondary | ICD-10-CM | POA: Insufficient documentation

## 2012-01-15 DIAGNOSIS — E039 Hypothyroidism, unspecified: Secondary | ICD-10-CM | POA: Insufficient documentation

## 2012-01-15 DIAGNOSIS — R0601 Orthopnea: Secondary | ICD-10-CM | POA: Insufficient documentation

## 2012-01-15 DIAGNOSIS — N189 Chronic kidney disease, unspecified: Secondary | ICD-10-CM | POA: Insufficient documentation

## 2012-01-15 DIAGNOSIS — R0602 Shortness of breath: Secondary | ICD-10-CM | POA: Insufficient documentation

## 2012-01-15 DIAGNOSIS — M7989 Other specified soft tissue disorders: Secondary | ICD-10-CM | POA: Insufficient documentation

## 2012-01-15 DIAGNOSIS — R609 Edema, unspecified: Secondary | ICD-10-CM | POA: Insufficient documentation

## 2012-01-15 DIAGNOSIS — I499 Cardiac arrhythmia, unspecified: Secondary | ICD-10-CM | POA: Insufficient documentation

## 2012-01-15 DIAGNOSIS — I509 Heart failure, unspecified: Secondary | ICD-10-CM

## 2012-01-15 LAB — BASIC METABOLIC PANEL
BUN: 16 mg/dL (ref 6–23)
Calcium: 9.7 mg/dL (ref 8.4–10.5)
Creatinine, Ser: 1.14 mg/dL — ABNORMAL HIGH (ref 0.50–1.10)
GFR calc Af Amer: 49 mL/min — ABNORMAL LOW (ref >90)
GFR calc non Af Amer: 42 mL/min — ABNORMAL LOW (ref >90)
Potassium: 4 mEq/L (ref 3.5–5.1)

## 2012-01-15 LAB — DIFFERENTIAL
Basophils Relative: 0 % (ref 0–1)
Eosinophils Absolute: 0.1 10*3/uL (ref 0.0–0.7)
Eosinophils Relative: 1 % (ref 0–5)
Monocytes Relative: 12 % (ref 3–12)
Neutrophils Relative %: 75 % (ref 43–77)

## 2012-01-15 LAB — CBC
Hemoglobin: 9.6 g/dL — ABNORMAL LOW (ref 12.0–15.0)
MCH: 30 pg (ref 26.0–34.0)
MCHC: 30.8 g/dL (ref 30.0–36.0)

## 2012-01-15 LAB — PROTIME-INR: Prothrombin Time: 28.5 seconds — ABNORMAL HIGH (ref 11.6–15.2)

## 2012-01-15 MED ORDER — IPRATROPIUM BROMIDE 0.02 % IN SOLN
0.5000 mg | Freq: Once | RESPIRATORY_TRACT | Status: AC
  Administered 2012-01-15: 0.5 mg via RESPIRATORY_TRACT
  Filled 2012-01-15: qty 2.5

## 2012-01-15 MED ORDER — NITROGLYCERIN 2 % TD OINT
1.0000 [in_us] | TOPICAL_OINTMENT | Freq: Four times a day (QID) | TRANSDERMAL | Status: DC
  Administered 2012-01-15: 1 [in_us] via TOPICAL

## 2012-01-15 MED ORDER — FUROSEMIDE 10 MG/ML IJ SOLN
80.0000 mg | Freq: Once | INTRAMUSCULAR | Status: AC
  Administered 2012-01-15: 80 mg via INTRAVENOUS
  Filled 2012-01-15: qty 8
  Filled 2012-01-15: qty 4

## 2012-01-15 MED ORDER — ALBUTEROL SULFATE (5 MG/ML) 0.5% IN NEBU
2.5000 mg | INHALATION_SOLUTION | Freq: Once | RESPIRATORY_TRACT | Status: AC
  Administered 2012-01-15: 2.5 mg via RESPIRATORY_TRACT
  Filled 2012-01-15: qty 0.5

## 2012-01-15 MED ORDER — PREDNISONE 20 MG PO TABS
60.0000 mg | ORAL_TABLET | Freq: Once | ORAL | Status: AC
  Administered 2012-01-15: 60 mg via ORAL
  Filled 2012-01-15: qty 3

## 2012-01-15 NOTE — ED Notes (Signed)
Pt was at PCP to have coumadin level checked. Became sob after walking a few steps. Sent by PCP for eval. Pt states this is normal for her. No complaints

## 2012-01-15 NOTE — ED Provider Notes (Signed)
History     CSN: [id number]  Arrival date & time 01/15/12  1559   First MD Initiated Contact with Patient 01/15/12 1606      Chief Complaint  Patient presents with  . Shortness of Breath    (Consider location/radiation/quality/duration/timing/severity/associated sxs/prior treatment) HPI Comments: Patient reports that earlier today while she was at the office of her PCP she became increasingly short of breath while walking a few steps.  Her caregiver is in the room with her at this time and reports that this is new.  Patient reports that she is not having any symptoms at this time.  She denies any chest pain, nausea, vomiting, diaphoresis, cough, or fever.   She reports 2 pillow orthopnea, but reports that this is unchanged.  She also reports lower extremity edema, but also reports that this is unchanged.  She is on 3.5 L [place] oxygen at home.  She currently lives at home and has a 24 hour caregiver that stays with her.  She has a PMH significant for diastolic CHF, a flutter on coumadin, and COPD.  Her cardiologist is Dr. Graciela Husbands and she sees Dr. Gala Romney for CHF and her Pulmonologist is Dr. Shan Levans.  Her Primary Care Physician is Dr. Herb Grays.    Patient is a 78 y.o. female presenting with shortness of breath. The history is provided by the patient.  Shortness of Breath  Associated symptoms include shortness of breath. Pertinent negatives include no chest pain, no fever and no cough.    Past Medical History  Diagnosis Date  . COPD (chronic obstructive pulmonary disease)   . Hyperlipidemia   . Hypertension   . Osteoporosis   . Hypothyroid   . Memory loss   . Anemia     Presumed GI on iron replacement hemoglobin in the 10 range  . Atrial flutter     Coumadin initiated 7/12 and followed by PCP; patient felt to be high risk for RFCA secondary to COPD;  echo was done 7/30:  EF 50% to 55%. Wall motion was normal ;there were no regional wall motion abnormalities, mild AS, MAC, mild  LAE, mild RAE, PASP 35, mild pulmonary HTN  . On home O2   . Anxiety   . Depression   . CHF (congestive heart failure)   . Emphysema   . Chronic kidney disease   . Breast cancer   . DEMENTIA 11/04/11    "she has some significant dementia"    Past Surgical History  Procedure Date  . Tonsillectomy   . Appendectomy   . Abdominal hysterectomy   . Mastectomy 1990    right  . Cataract extraction     "don't know if both eyes or if she had lenses put in"    Family History  Problem Relation Age of Onset  . Emphysema    . Heart attack    . Diabetes    . Hypertension    . Cancer    . Anxiety disorder    . Depression    . Coronary artery disease      History  Substance Use Topics  . Smoking status: Never Smoker   . Smokeless tobacco: Never Used  . Alcohol Use: No    OB History    Grav Para Term Preterm Abortions TAB SAB Ect Mult Living                  Review of Systems  Constitutional: Negative for fever and chills.  Eyes: Negative  for visual disturbance.  Respiratory: Positive for shortness of breath. Negative for cough.   Cardiovascular: Positive for leg swelling. Negative for chest pain.  Gastrointestinal: Negative for nausea and vomiting.  Neurological: Negative for dizziness, syncope, weakness, light-headedness and numbness.    Allergies  Codeine  Home Medications   Current Outpatient Rx  Name Route Sig Dispense Refill  . ALBUTEROL SULFATE HFA 108 (90 BASE) MCG/ACT IN AERS Inhalation Inhale 2 puffs into the lungs every 4 (four) hours as needed. For shortness of breath    . ALPRAZOLAM 0.25 MG PO TABS Oral Take 0.25 mg by mouth at bedtime as needed. For anxiety    . AMLODIPINE BESYLATE 5 MG PO TABS Oral Take 1 tablet (5 mg total) by mouth daily. 30 tablet 6  . ATORVASTATIN CALCIUM 20 MG PO TABS Oral Take 1 tablet (20 mg total) by mouth at bedtime. 30 tablet 6  . BISOPROLOL FUMARATE 5 MG PO TABS Oral Take 1 tablet (5 mg total) by mouth daily. 30 tablet 6  .  OSCAL 500/200 D-3 PO Oral Take 1 tablet by mouth daily.      Marland Kitchen CITALOPRAM HYDROBROMIDE 20 MG PO TABS Oral Take 20 mg by mouth.      . DONEPEZIL HCL 10 MG PO TABS Oral Take 10 mg by mouth at bedtime.     Marland Kitchen FERROUS SULFATE 325 (65 FE) MG PO TABS Oral Take 325 mg by mouth daily with breakfast.      . FUROSEMIDE 40 MG PO TABS Oral Take 1 tablet (40 mg total) by mouth 2 (two) times daily. Take 80 mg in the AM and 40 mg in the PM 90 tablet 6  . HALOPERIDOL 0.5 MG PO TABS Oral Take 0.5 mg by mouth at bedtime. For sleep    . LEVOTHYROXINE SODIUM 100 MCG PO TABS Oral Take 100 mcg by mouth daily.      Marland Kitchen POTASSIUM CHLORIDE CRYS ER 20 MEQ PO TBCR Oral Take 20 mEq by mouth daily.    . SENNOSIDES 8.6 MG PO TABS Oral Take 2 tablets by mouth every other day.     Marland Kitchen TIOTROPIUM BROMIDE MONOHYDRATE 18 MCG IN CAPS Inhalation Place 1 capsule (18 mcg total) into inhaler and inhale daily. 30 capsule 6  . WARFARIN SODIUM 5 MG PO TABS Oral Take 5 mg by mouth daily.    . IBUPROFEN 200 MG PO TABS Oral Take 200 mg by mouth every 8 (eight) hours as needed. For pain    . POLYETHYLENE GLYCOL 3350 PO PACK Oral Take 17 g by mouth every other day.       BP 113/71  Pulse 120  Temp 97.8 F (36.6 C)  Resp 27  SpO2 98%  Physical Exam  Nursing note and vitals reviewed. Constitutional: She is oriented to person, place, and time. She appears well-developed and well-nourished. No distress.  HENT:  Head: Normocephalic and atraumatic.  Cardiovascular: Normal rate and normal heart sounds.  An irregular rhythm present.  Pulmonary/Chest: Effort normal and breath sounds normal. No accessory muscle usage. Not tachypneic. No respiratory distress. She has no wheezes.       Prolonged expiratory phase. Diffuse decreased breath sounds.  Abdominal: Soft. She exhibits no distension.  Musculoskeletal:       1+ bilateral pitting edema.  Patient wearing compression stockings.  Neurological: She is alert and oriented to person, place, and  time.  Skin: Skin is warm and dry. She is not diaphoretic.  Psychiatric: She has a  normal mood and affect.    ED Course  Procedures (including critical care time)  Labs Reviewed  CBC - Abnormal; Notable for the following:    RBC 3.20 (*)    Hemoglobin 9.6 (*)    HCT 31.2 (*)    RDW 16.1 (*)    All other components within normal limits  BASIC METABOLIC PANEL - Abnormal; Notable for the following:    Chloride 94 (*)    CO2 42 (*)    Glucose, Bld 110 (*)    Creatinine, Ser 1.14 (*)    GFR calc non Af Amer 42 (*)    GFR calc Af Amer 49 (*)    All other components within normal limits  PRO B NATRIURETIC PEPTIDE - Abnormal; Notable for the following:    Pro B Natriuretic peptide (BNP) 6359.0 (*)    All other components within normal limits  PROTIME-INR - Abnormal; Notable for the following:    Prothrombin Time 28.5 (*)    INR 2.63 (*)    All other components within normal limits  APTT - Abnormal; Notable for the following:    aPTT 38 (*)    All other components within normal limits  DIFFERENTIAL  TROPONIN I   Dg Chest 2 View  01/15/2012  *RADIOLOGY REPORT*  Clinical Data: Shortness of breath, history hypertension, COPD, breast cancer  CHEST - 2 VIEW  Comparison: 12/05/2011  Findings: Enlargement of cardiac silhouette. Atherogenic calcification aorta. Mediastinal contours and pulmonary vascularity normal. Emphysematous and chronic bronchitic changes. Parenchymal scarring right upper lobe and left mid lung. No definite acute infiltrate or significant pleural effusion. No pneumothorax. Bones demineralized. Surgical clips right axilla.  IMPRESSION: Chronic changes. No definite acute abnormalities. Enlargement of cardiac silhouette.  Original Report Authenticated By: Lollie Marrow, M.D.     1. CHF exacerbation     Patient given Nitro paste and given IV lasix while in the ED.  2:19 AM Patient ambulated through the ED while on 4L oxygen [place].  Her oxygen level desated to 85%.  Patient  was asymptomatic. 2:19 AM Discussed with Leeann Must with [facility].  He reports that he will come see patient in the ED.  Discussed patient with Leeann Must Cardiology.  He reports that he feels that patient is stable for discharged and recommends continuing her current medication regimen and having her follow up with [facility] in the next 1-2 weeks for follow up of her CHF.     Will discharge patient home.  Patient, patient's son, and caregiver in agreement with the plan.   MDM  Patient with what appears to be a CHF exacerbation.  Patient evaluated by Cardiology.  Cardiology feels that patient can be discharged home and follow up with them outpatient.        Pascal Lux [place], PA-C 01/16/12 [phone]

## 2012-01-15 NOTE — Progress Notes (Signed)
Brief Note  [name] is an 78 yo woman with COPD on home 3-3.5L home O2 (typically 95-98% with desaturations to mid 80s with exertion around the home), atrial flutter/atrial fibrillation on anticoagulation with warfarin and rate control with verapamil, diastolic heart failure, preserved EF 50-55% with stable weight 182-184 lbs at home who was at family physicians office today and sent to ER for further evaluation per patient's caregiver of her symptoms of SOB. Per patient's caregiver her weight is stable, HR stable, typically 90s, stable O2 saturation typically mid 90s with occasional desaturation to mid 80s who has been more SOB/DOE with walking with an episode this morning; however, no syncope/presyncope, no falls...just a limitation on walking distance from my conversation with patient/patient's son and caregiver. The ER was planning on discharging patient home until she desaturated to 85% with walking around (outside of room). Patient was asymptomatic. Thus, they called me to review. I spoke with patient, caregiver and patient's son and reviewed the stable CBC, BMP and Chest x-ray finding along with vitals. The BNP is slightly more elevated but her exam and symptoms and weight are stable. After long distance and apparently asymptomatic, stable patient with close supervision - home caregiver, home PT (planned tomorrow), we plan for allowing Ms. Habenicht to go home with low threshold to return for any concern. She has a CHF appointment at end of month of February and we will plan on closer follow-up.   Vitals reviewed; BP 117/79  Pulse 120  Temp 97.8 F (36.6 C)  Resp 18  SpO2 98%, cbc, bmp reviewed, creatinine stable 1.1-1.2, bnp elevated at [facility] but previously as high as 9k. JVD 8-9cm, irregularly irregular, moderate air movement but decreased BS, abdomen soft, 1+ LEE with stockings on. inr stable at 2.6  Plan: continue current medical regimen of diuretics, anticoagulation, rate control. Follow-up  in HF clinic in 1-2 weeks and low threshold for representation with any questions.   Leeann Must, MD

## 2012-01-15 NOTE — ED Notes (Signed)
Pt ambulated with walker and moderate assistance.  Pt with portable O2 at 4L, when returned to sitting position HR was 111, Pulse Ox 84%.  After approx 7 min pt returned to 97% and was moved back down to 3 L

## 2012-01-15 NOTE — ED Notes (Signed)
Pt denies any pan and pt and family denies any questions upon discharge.

## 2012-01-15 NOTE — ED Notes (Signed)
Pt got short of breath at pcp while having coumidan level checked.  Pt thinks there is no need for her to be here as this is her norm, she has COPD.  Pt states this has happened a few times in the last month.  Has home O2 but only wears as needed.

## 2012-01-15 NOTE — ED Notes (Signed)
Pt eating [place] sandwich and diet coke

## 2012-01-16 ENCOUNTER — Telehealth (HOSPITAL_COMMUNITY): Payer: Self-pay | Admitting: *Deleted

## 2012-01-16 NOTE — Telephone Encounter (Signed)
Dawn, please schedule appt next week in clinic, thanks

## 2012-01-16 NOTE — Telephone Encounter (Signed)
Mr Moller called today.  He took his mother to the coumadin clinic yesterday and her heart rate was high, so they sent her to the er to get her heart rate to slow down.  Mr Florentino called today to follow up with [place], she is scheduled to come in on February 25th.  He wasn't sure if there was anything he needed to do for her at this point.  Please return his call.  Thanks!

## 2012-01-16 NOTE — Telephone Encounter (Signed)
Pt was in ER yesterday w/CHF BNP was elevated and pt was given Lasix 80 mg IV x 1 dose, will call and f/u with pt and bring her into clinic sooner

## 2012-01-17 NOTE — ED Provider Notes (Signed)
Medical screening examination/treatment/procedure(s) were conducted as a shared visit with non-physician practitioner(s) and myself.  I personally evaluated the patient during the encounter  This elderly female with multiple medical problems, including home oxygen use now presents with dyspnea.  On exam she is in no distress, though she is on supplemental oxygen.  Patient has mild peripheral edema.  The patient's clinical presentation is concern for heart failure exacerbation.  Soon after arrival the patient received both Lasix and nitroglycerin paste.  The patient's case was discussed with the cardiology service.  Given the patient's easy access to followup, her established history of heart failure, her response to emergency department interventions, she was discharged to followup as an outpatient.  X-ray reviewed by me Pulse oximetry 100% on nasal cannula, abnormal Cardiac monitor 110, atrial fibrillation, abnormal  Date: 01/17/2012  Rate: 113  Rhythm: atrial fibrillation  QRS Axis: normal  Intervals: normal  ST/T Wave abnormalities: nonspecific ST/T changes  Conduction Disutrbances:none  Narrative Interpretation:   Old EKG Reviewed: changes noted ABNORMAL   Gerhard Munch, MD 01/17/12 [phone]

## 2012-01-22 ENCOUNTER — Ambulatory Visit (HOSPITAL_COMMUNITY)
Admission: RE | Admit: 2012-01-22 | Discharge: 2012-01-22 | Disposition: A | Discharge status: Home / Self Care | Payer: Medicare Other | Source: Ambulatory Visit | Attending: Internal Medicine | Admitting: Internal Medicine

## 2012-01-22 VITALS — BP 98/52 | HR 110 | Wt 192.5 lb

## 2012-01-22 DIAGNOSIS — I359 Nonrheumatic aortic valve disorder, unspecified: Secondary | ICD-10-CM

## 2012-01-22 DIAGNOSIS — I5032 Chronic diastolic (congestive) heart failure: Secondary | ICD-10-CM | POA: Insufficient documentation

## 2012-01-22 DIAGNOSIS — I509 Heart failure, unspecified: Secondary | ICD-10-CM

## 2012-01-22 MED ORDER — FUROSEMIDE 40 MG PO TABS: 80.0000 mg | ORAL_TABLET | Freq: Two times a day (BID) | ORAL | Status: DC

## 2012-01-22 NOTE — Patient Instructions (Signed)
Increase lasix to 80 mg (2 tabs) twice daily.  Labs on Monday or Tuesday at your primary care providers.    Follow up 3 weeks.

## 2012-02-02 ENCOUNTER — Other Ambulatory Visit: Payer: Self-pay | Admitting: Internal Medicine

## 2012-02-02 MED ORDER — FUROSEMIDE 40 MG PO TABS: 80.0000 mg | ORAL_TABLET | Freq: Two times a day (BID) | ORAL | Status: DC

## 2012-02-02 NOTE — Progress Notes (Signed)
HPI:  [name] is a 78 y.o. female with PMHx significant for COPD, atrial flutter (anticoagulated on Coumadin, rate-controlled on Verapamil outpatient), diastolic congestive heart failure, EF 50% to 55%. Echo showed wall motion was normal ;there were no regional wall motion abnormalities, mild AS, MAC, mild LAE, mild RAE, PASP 35, mild pulmonary HTN. HTN, and HL.   She was recently discharged to [facility] on 11/26 after admit for PNA. She was then readmitted to [facility] hospital with acute respiratory failure secondary to COPD and CHF exacerbation. Diuresed 8lbs. Discharged 12/28 and doing well. Discharged from [facility] to home on 12/25/11.   She returns for routine follow up today with her 24 hour caregiver, [name]. She went to the ER for dropping her O2 sats.  She was given IV lasix and discharged home.  Her weight is up 5 pounds over the last month.  No CP/SOB/orthopnea or PND. Continue with her home O2. +fatigue  ROS: All systems negative except as listed in HPI, PMH and Problem List.  Past Medical History  Diagnosis Date  . COPD (chronic obstructive pulmonary disease)   . Hyperlipidemia   . Hypertension   . Osteoporosis   . Hypothyroid   . Memory loss   . Anemia     Presumed GI on iron replacement hemoglobin in the 10 range  . Atrial flutter     Coumadin initiated 7/12 and followed by PCP; patient felt to be high risk for RFCA secondary to COPD;  echo was done 7/30:  EF 50% to 55%. Wall motion was normal ;there were no regional wall motion abnormalities, mild AS, MAC, mild LAE, mild RAE, PASP 35, mild pulmonary HTN  . On home O2   . Anxiety   . Depression   . CHF (congestive heart failure)   . Emphysema   . Chronic kidney disease   . Breast cancer   . DEMENTIA 11/04/11    "she has some significant dementia"    Current Outpatient Prescriptions  Medication Sig Dispense Refill  . albuterol (PROAIR HFA) 108 (90 BASE) MCG/ACT inhaler Inhale 2 puffs into the lungs every 4 (four)  hours as needed. For shortness of breath      . ALPRAZolam (XANAX) 0.25 MG tablet Take 0.25 mg by mouth at bedtime as needed. For anxiety      . amLODipine (NORVASC) 5 MG tablet Take 1 tablet (5 mg total) by mouth daily.  30 tablet  6  . atorvastatin (LIPITOR) 20 MG tablet Take 1 tablet (20 mg total) by mouth at bedtime.  30 tablet  6  . bisoprolol (ZEBETA) 5 MG tablet Take 1 tablet (5 mg total) by mouth daily.  30 tablet  6  . Calcium Carbonate-Vitamin D (OSCAL 500/200 D-3 PO) Take 1 tablet by mouth daily.        . citalopram (CELEXA) 20 MG tablet Take 20 mg by mouth.        . donepezil (ARICEPT) 10 MG tablet Take 10 mg by mouth at bedtime.       . ferrous sulfate 325 (65 FE) MG tablet Take 325 mg by mouth daily with breakfast.        . ibuprofen (ADVIL,MOTRIN) 200 MG tablet Take 200 mg by mouth every 8 (eight) hours as needed. For pain      . levothyroxine (SYNTHROID, LEVOTHROID) 100 MCG tablet Take 100 mcg by mouth daily.        . polyethylene glycol (MIRALAX / GLYCOLAX) packet Take 17 g  by mouth every other day.       . potassium chloride SA (KLOR-CON M20) 20 MEQ tablet Take 20 mEq by mouth daily.      Marland Kitchen senna (SENOKOT) 8.6 MG tablet Take 2 tablets by mouth every other day.       . tiotropium (SPIRIVA HANDIHALER) 18 MCG inhalation capsule Place 1 capsule (18 mcg total) into inhaler and inhale daily.  30 capsule  6  . warfarin (COUMADIN) 5 MG tablet Take 5 mg by mouth daily.      Marland Kitchen DISCONTD: furosemide (LASIX) 40 MG tablet Take 2 tablets (80 mg total) by mouth 2 (two) times daily. Take 80 mg in the AM and 40 mg in the PM  60 tablet  6  . furosemide (LASIX) 40 MG tablet Take 2 tablets (80 mg total) by mouth 2 (two) times daily. Or as instructed by your physician  120 tablet  6  . DISCONTD: furosemide (LASIX) 40 MG tablet Take 1 tablet (40 mg total) by mouth 2 (two) times daily.  60 tablet  6     PHYSICAL EXAM: Filed Vitals:   01/22/12 1229  BP: 98/52  Pulse: 110  Weight: 192 lb 8 oz  (87.317 kg)  SpO2: 90%   General: elderly sitting in WC. Lethargic but wakes up easilyNo resp difficulty  HEENT: normal  Neck: supple. JVP 8-9 Carotids 2+ bilaterally; no bruits. No lymphadenopathy or thryomegaly appreciated.  Cor: PMI normal. Irregular murmer. No rubs, gallops. 2/6 AS Lungs: clear  Abdomen: soft, nontender, nondistended. No hepatosplenomegaly. No bruits or masses. Good bowel sounds.  Extremities: no cyanosis, clubbing, rash, 1+ bilateral lower extremity edema. +TED hose Neuro: alert & orientedx3, cranial nerves grossly intact. Moves all 4 extremities w/o difficulty. Affect pleasant.    ASSESSMENT & PLAN:

## 2012-02-02 NOTE — Assessment & Plan Note (Signed)
Volume status remains up. Will increase lasix to 80 bid. Watch labs closely. Will have them drawn at PCP early next week.

## 2012-02-02 NOTE — Assessment & Plan Note (Signed)
Mild AS. Asymptomatic.

## 2012-02-03 ENCOUNTER — Telehealth (HOSPITAL_COMMUNITY): Payer: Self-pay | Admitting: *Deleted

## 2012-02-03 ENCOUNTER — Encounter: Payer: Self-pay | Admitting: Internal Medicine

## 2012-02-03 MED ORDER — METOLAZONE 2.5 MG PO TABS: 2.5000 mg | ORAL_TABLET | ORAL | Status: DC

## 2012-02-03 NOTE — Telephone Encounter (Signed)
Spoke w/pt's sone he states wt has been pretty stable, she is on lasix 80 mg bid, O2 sat running around 88% and pt seems a little more sleepy than normal but wakes right up when spoken to, discussed w/Nicki Elige Radon, PA she is concerned that pt's wt was up when we saw her 2/7, will have pt take metolazone 2.5 for 2 days son is aware

## 2012-02-03 NOTE — Telephone Encounter (Signed)
Mr Grandmaison called today regarding his mother.  She is experiencing more SOB and is concerned that she may need to be seen before her appointment on Monday.  He would like a call back.  Thanks.

## 2012-02-09 ENCOUNTER — Ambulatory Visit (HOSPITAL_COMMUNITY)
Admission: RE | Admit: 2012-02-09 | Discharge: 2012-02-09 | Disposition: A | Discharge status: Home / Self Care | Payer: Medicare Other | Source: Ambulatory Visit | Attending: Internal Medicine | Admitting: Internal Medicine

## 2012-02-09 ENCOUNTER — Other Ambulatory Visit: Payer: Self-pay

## 2012-02-09 ENCOUNTER — Telehealth: Payer: Self-pay | Admitting: Internal Medicine

## 2012-02-09 VITALS — BP 90/52 | HR 100

## 2012-02-09 DIAGNOSIS — I5032 Chronic diastolic (congestive) heart failure: Secondary | ICD-10-CM

## 2012-02-09 DIAGNOSIS — I509 Heart failure, unspecified: Secondary | ICD-10-CM

## 2012-02-09 DIAGNOSIS — I498 Other specified cardiac arrhythmias: Secondary | ICD-10-CM | POA: Insufficient documentation

## 2012-02-09 DIAGNOSIS — R001 Bradycardia, unspecified: Secondary | ICD-10-CM

## 2012-02-09 DIAGNOSIS — I359 Nonrheumatic aortic valve disorder, unspecified: Secondary | ICD-10-CM | POA: Insufficient documentation

## 2012-02-09 DIAGNOSIS — I4892 Unspecified atrial flutter: Secondary | ICD-10-CM

## 2012-02-09 DIAGNOSIS — I4891 Unspecified atrial fibrillation: Secondary | ICD-10-CM | POA: Insufficient documentation

## 2012-02-09 MED ORDER — DILTIAZEM HCL ER 180 MG PO CP24: 180.0000 mg | ORAL_CAPSULE | Freq: Every day | ORAL | Status: DC

## 2012-02-09 NOTE — Patient Instructions (Addendum)
Stop Amlodipine Start Diltiazem 180 mg daily  Labs tomorrow with [facility]  Your physician recommends that you schedule a follow-up appointment in: 6 weeks

## 2012-02-09 NOTE — Assessment & Plan Note (Signed)
AF with RVR @ 116. Will switch amlodipine to 5 to diltiazem 180.

## 2012-02-09 NOTE — Assessment & Plan Note (Signed)
AS is mild.

## 2012-02-09 NOTE — Assessment & Plan Note (Signed)
Volume status seems to be ok. However she is more weak today and unable to stand up - thus I wonder if she may be a bit too dry. However weight in acceptable range for her. I am wondering if this may be related to her UTI. AF also appears a bit fast today. Will get ECG and check labs. Hold lasix tonight but otherwise continue 80 bid pending lab work.

## 2012-02-09 NOTE — Progress Notes (Signed)
HPI:  [name] is a 78 y.o. female with PMHx significant for COPD, atrial flutter (anticoagulated on Coumadin, rate-controlled on Verapamil outpatient), diastolic congestive heart failure, EF 50% to 55%. Echo showed wall motion was normal ;there were no regional wall motion abnormalities, mild AS, MAC, mild LAE, mild RAE, PASP 35, mild pulmonary HTN. HTN, and HL.   She was recently discharged to [facility] on 11/26 after admit for PNA. She was then readmitted to [facility] hospital with acute respiratory failure secondary to COPD and CHF exacerbation. Diuresed 8lbs. Discharged 12/28 and doing well. Discharged from [facility] to home on 12/25/11.   She returns for routine follow up today with her 24 hour caregiver, Vida.  We saw her several weeks ago and had some volume overload lasix increased from 80/40 to 80 bid.    Says she is dyspneic a lot of the time. Spends most of her time in the recliner. Mild edema. Recently begun treatment for a UTI. Unable to stand up to be weighed today. Weights very stable running 185-187 (previously as low as 182)   ROS: All systems negative except as listed in HPI, PMH and Problem List.  Past Medical History  Diagnosis Date  . COPD (chronic obstructive pulmonary disease)   . Hyperlipidemia   . Hypertension   . Osteoporosis   . Hypothyroid   . Memory loss   . Anemia     Presumed GI on iron replacement hemoglobin in the 10 range  . Atrial flutter     Coumadin initiated 7/12 and followed by PCP; patient felt to be high risk for RFCA secondary to COPD;  echo was done 7/30:  EF 50% to 55%. Wall motion was normal ;there were no regional wall motion abnormalities, mild AS, MAC, mild LAE, mild RAE, PASP 35, mild pulmonary HTN  . On home O2   . Anxiety   . Depression   . CHF (congestive heart failure)   . Emphysema   . Chronic kidney disease   . Breast cancer   . DEMENTIA 11/04/11    "she has some significant dementia"    Current Outpatient Prescriptions    Medication Sig Dispense Refill  . albuterol (PROAIR HFA) 108 (90 BASE) MCG/ACT inhaler Inhale 2 puffs into the lungs every 4 (four) hours as needed. For shortness of breath      . ALPRAZolam (XANAX) 0.25 MG tablet Take 0.25 mg by mouth at bedtime as needed. For anxiety      . amLODipine (NORVASC) 5 MG tablet Take 1 tablet (5 mg total) by mouth daily.  30 tablet  6  . atorvastatin (LIPITOR) 20 MG tablet Take 1 tablet (20 mg total) by mouth at bedtime.  30 tablet  6  . bisoprolol (ZEBETA) 5 MG tablet Take 1 tablet (5 mg total) by mouth daily.  30 tablet  6  . Calcium Carbonate-Vitamin D (OSCAL 500/200 D-3 PO) Take 1 tablet by mouth daily.        . citalopram (CELEXA) 20 MG tablet Take 20 mg by mouth.        . donepezil (ARICEPT) 10 MG tablet Take 10 mg by mouth at bedtime.       . ferrous sulfate 325 (65 FE) MG tablet Take 325 mg by mouth daily with breakfast.        . furosemide (LASIX) 40 MG tablet Take 2 tablets (80 mg total) by mouth 2 (two) times daily. Or as instructed by your physician  120 tablet  6  . ibuprofen (ADVIL,MOTRIN) 200 MG tablet Take 200 mg by mouth every 8 (eight) hours as needed. For pain      . levothyroxine (SYNTHROID, LEVOTHROID) 100 MCG tablet Take 100 mcg by mouth daily.        . metolazone (ZAROXOLYN) 2.5 MG tablet Take 1 tablet (2.5 mg total) by mouth as directed.  5 tablet  0  . polyethylene glycol (MIRALAX / GLYCOLAX) packet Take 17 g by mouth every other day.       . potassium chloride SA (KLOR-CON M20) 20 MEQ tablet Take 20 mEq by mouth daily.      Marland Kitchen senna (SENOKOT) 8.6 MG tablet Take 2 tablets by mouth every other day.       . tiotropium (SPIRIVA HANDIHALER) 18 MCG inhalation capsule Place 1 capsule (18 mcg total) into inhaler and inhale daily.  30 capsule  6  . warfarin (COUMADIN) 5 MG tablet Take 5 mg by mouth daily.      Marland Kitchen DISCONTD: furosemide (LASIX) 40 MG tablet Take 1 tablet (40 mg total) by mouth 2 (two) times daily.  60 tablet  6     PHYSICAL  EXAM: Filed Vitals:   02/09/12 1031  BP: 90/52  Pulse: 100  SpO2: 93%   General: elderly sitting in WC. Lethargic but wakes up easily No resp difficulty  HEENT: normal  Neck: supple. JVP flat Carotids 2+ bilaterally; no bruits. No lymphadenopathy or thryomegaly appreciated.  Cor: PMI normal. Irregular irregular. No rubs, gallops. 2/6 AS Lungs: clear  Abdomen: soft, nontender, nondistended. No hepatosplenomegaly. No bruits or masses. Good bowel sounds.  Extremities: no cyanosis, clubbing, rash, tr bilateral lower ankle edema. +TED hose. + wound with dressing on RLE Neuro: alert & orientedx3, cranial nerves grossly intact. Moves all 4 extremities w/o difficulty. Affect pleasant.    ASSESSMENT & PLAN:

## 2012-02-09 NOTE — Progress Notes (Signed)
Encounter addended by: Noralee Space, RN on: 02/09/2012 11:35 AM<BR>     Documentation filed: Patient Instructions Section, Orders

## 2012-02-10 ENCOUNTER — Telehealth (HOSPITAL_COMMUNITY): Payer: Self-pay | Admitting: *Deleted

## 2012-02-10 ENCOUNTER — Encounter: Payer: Self-pay | Admitting: Internal Medicine

## 2012-02-10 NOTE — Telephone Encounter (Signed)
Received pt's lab results from today, k 3.2 bun 30 cr 1.47, reviewed by Tonye Becket, NP will have pt hold Lasix x 2 days and take an extra 40 meq of kcl tonight, will recheck in 1 week, pt's son is aware he state pt has taken Lasix today but will hold Wed and [place], order faxed to [place] to draw labs in 1 week at [phone]

## 2012-02-10 NOTE — Progress Notes (Signed)
Encounter addended by: Almedia Balls on: 02/10/2012  7:28 AM<BR>     Documentation filed: Charges VN

## 2012-02-18 NOTE — Telephone Encounter (Signed)
Opened in error

## 2012-02-20 ENCOUNTER — Encounter: Payer: Self-pay | Admitting: Internal Medicine

## 2012-02-20 ENCOUNTER — Telehealth (HOSPITAL_COMMUNITY): Payer: Self-pay | Admitting: *Deleted

## 2012-02-20 NOTE — Telephone Encounter (Signed)
Please call primary care physician.  Thanks

## 2012-02-20 NOTE — Telephone Encounter (Signed)
[name] is calling today due to some recent changes with his mother.  He states that his mom is more confused and hallucinating.  She did have a recent med change and he is wondering if that is the cause.  Please call him back.  Thanks.

## 2012-02-25 ENCOUNTER — Ambulatory Visit (INDEPENDENT_AMBULATORY_CARE_PROVIDER_SITE_OTHER): Payer: Medicare Other | Admitting: *Deleted

## 2012-02-25 DIAGNOSIS — Z7901 Long term (current) use of anticoagulants: Secondary | ICD-10-CM | POA: Insufficient documentation

## 2012-02-25 DIAGNOSIS — I4892 Unspecified atrial flutter: Secondary | ICD-10-CM

## 2012-02-25 LAB — POCT INR: INR: 1.3

## 2012-02-25 NOTE — Patient Instructions (Signed)
A full discussion of the nature of anticoagulants has been carried out.  A benefit risk analysis has been presented to the patient, so that they understand the justification for choosing anticoagulation at this time. The need for frequent and regular monitoring, precise dosage adjustment and compliance is stressed.  Side effects of potential bleeding are discussed.  The patient should avoid any OTC items containing aspirin or ibuprofen, and should avoid great swings in general diet.   

## 2012-03-01 ENCOUNTER — Ambulatory Visit: Payer: Self-pay | Admitting: Internal Medicine

## 2012-03-01 DIAGNOSIS — I4892 Unspecified atrial flutter: Secondary | ICD-10-CM

## 2012-03-01 DIAGNOSIS — Z7901 Long term (current) use of anticoagulants: Secondary | ICD-10-CM

## 2012-03-01 LAB — POCT INR: INR: 1.5

## 2012-03-04 ENCOUNTER — Other Ambulatory Visit: Payer: Self-pay

## 2012-03-04 ENCOUNTER — Encounter (HOSPITAL_COMMUNITY): Payer: Self-pay | Admitting: Emergency Medicine

## 2012-03-04 ENCOUNTER — Observation Stay (HOSPITAL_COMMUNITY)
Admission: EM | Admit: 2012-03-04 | Discharge: 2012-03-08 | Disposition: A | Discharge status: Home / Self Care | Payer: Medicare Other | Attending: Internal Medicine | Admitting: Internal Medicine

## 2012-03-04 ENCOUNTER — Emergency Department (HOSPITAL_COMMUNITY): Payer: Medicare Other

## 2012-03-04 DIAGNOSIS — Z66 Do not resuscitate: Secondary | ICD-10-CM | POA: Insufficient documentation

## 2012-03-04 DIAGNOSIS — J9600 Acute respiratory failure, unspecified whether with hypoxia or hypercapnia: Secondary | ICD-10-CM

## 2012-03-04 DIAGNOSIS — C50919 Malignant neoplasm of unspecified site of unspecified female breast: Secondary | ICD-10-CM

## 2012-03-04 DIAGNOSIS — R9389 Abnormal findings on diagnostic imaging of other specified body structures: Secondary | ICD-10-CM

## 2012-03-04 DIAGNOSIS — I4891 Unspecified atrial fibrillation: Secondary | ICD-10-CM | POA: Insufficient documentation

## 2012-03-04 DIAGNOSIS — I4892 Unspecified atrial flutter: Secondary | ICD-10-CM | POA: Diagnosis present

## 2012-03-04 DIAGNOSIS — N289 Disorder of kidney and ureter, unspecified: Secondary | ICD-10-CM

## 2012-03-04 DIAGNOSIS — D649 Anemia, unspecified: Secondary | ICD-10-CM

## 2012-03-04 DIAGNOSIS — R55 Syncope and collapse: Secondary | ICD-10-CM | POA: Diagnosis present

## 2012-03-04 DIAGNOSIS — E785 Hyperlipidemia, unspecified: Secondary | ICD-10-CM

## 2012-03-04 DIAGNOSIS — J449 Chronic obstructive pulmonary disease, unspecified: Secondary | ICD-10-CM | POA: Insufficient documentation

## 2012-03-04 DIAGNOSIS — I10 Essential (primary) hypertension: Principal | ICD-10-CM | POA: Diagnosis present

## 2012-03-04 DIAGNOSIS — J4489 Other specified chronic obstructive pulmonary disease: Secondary | ICD-10-CM | POA: Insufficient documentation

## 2012-03-04 DIAGNOSIS — R609 Edema, unspecified: Secondary | ICD-10-CM

## 2012-03-04 DIAGNOSIS — I5032 Chronic diastolic (congestive) heart failure: Secondary | ICD-10-CM | POA: Diagnosis present

## 2012-03-04 DIAGNOSIS — Z7901 Long term (current) use of anticoagulants: Secondary | ICD-10-CM

## 2012-03-04 DIAGNOSIS — N179 Acute kidney failure, unspecified: Secondary | ICD-10-CM

## 2012-03-04 DIAGNOSIS — I359 Nonrheumatic aortic valve disorder, unspecified: Secondary | ICD-10-CM

## 2012-03-04 DIAGNOSIS — J189 Pneumonia, unspecified organism: Secondary | ICD-10-CM | POA: Diagnosis present

## 2012-03-04 DIAGNOSIS — E039 Hypothyroidism, unspecified: Secondary | ICD-10-CM | POA: Diagnosis present

## 2012-03-04 DIAGNOSIS — R001 Bradycardia, unspecified: Secondary | ICD-10-CM

## 2012-03-04 DIAGNOSIS — I509 Heart failure, unspecified: Secondary | ICD-10-CM | POA: Insufficient documentation

## 2012-03-04 LAB — DIFFERENTIAL
Basophils Absolute: 0 10*3/uL (ref 0.0–0.1)
Lymphocytes Relative: 9 % — ABNORMAL LOW (ref 12–46)
Neutro Abs: 6.9 10*3/uL (ref 1.7–7.7)

## 2012-03-04 LAB — PROTIME-INR
INR: 1.65 — ABNORMAL HIGH (ref 0.00–1.49)
Prothrombin Time: 19.8 seconds — ABNORMAL HIGH (ref 11.6–15.2)

## 2012-03-04 LAB — COMPREHENSIVE METABOLIC PANEL
Albumin: 3.2 g/dL — ABNORMAL LOW (ref 3.5–5.2)
Alkaline Phosphatase: 52 U/L (ref 39–117)
BUN: 31 mg/dL — ABNORMAL HIGH (ref 6–23)
Potassium: 5.1 mEq/L (ref 3.5–5.1)
Total Protein: 5.9 g/dL — ABNORMAL LOW (ref 6.0–8.3)

## 2012-03-04 LAB — TROPONIN I: Troponin I: <0.3 ng/mL (ref <0.30)

## 2012-03-04 LAB — CBC
HCT: 31.4 % — ABNORMAL LOW (ref 36.0–46.0)
Platelets: 148 10*3/uL — ABNORMAL LOW (ref 150–400)
RDW: 15.2 % (ref 11.5–15.5)
WBC: 8.5 10*3/uL (ref 4.0–10.5)

## 2012-03-04 NOTE — ED Notes (Signed)
Per ems- Pt coming from home where she had been sitting down and upon getting up and walking to the bathroom she passed out and was assisted to the floor by home health care aid.  Ems state pt was orthostatic upon standing. CBG 129.  Standing bp was 90/palp, supine bp was 132/palp

## 2012-03-04 NOTE — ED Notes (Signed)
Pt 78% on ra, placed on two liters 

## 2012-03-04 NOTE — ED Provider Notes (Signed)
History     CSN: [id number]  Arrival date & time 03/04/12  4098   First MD Initiated Contact with Patient 03/04/12 1905      Chief Complaint  Patient presents with  . Loss of Consciousness    (Consider location/radiation/quality/duration/timing/severity/associated sxs/prior treatment) Patient is a 78 y.o. female presenting with syncope. The history is provided by the patient, the EMS personnel and a caregiver.  Loss of Consciousness   she got up from a sitting position to go to the bathroom, and started shaking and became stiff. The caregiver lowered her to the floor and she was stiff for several minutes. The caregiver started to give CPR but stopped when the patient woke up and told her to stop pressing on her chest. There apparently was some loss of bowel control. She did not have any bit lip or tongue. Patient has no memory of this incident. She denies dizziness, chest pain, dyspnea. EMS reports blood pressure dropped from 132 systolic to 90 systolic with standing. She did have some nausea and vomiting. She denies headache. She's never had an episode like this. She is on warfarin for atrial fibrillation.  Past Medical History  Diagnosis Date  . COPD (chronic obstructive pulmonary disease)   . Hyperlipidemia   . Hypertension   . Osteoporosis   . Hypothyroid   . Memory loss   . Anemia     Presumed GI on iron replacement hemoglobin in the 10 range  . Atrial flutter     Coumadin initiated 7/12 and followed by PCP; patient felt to be high risk for RFCA secondary to COPD;  echo was done 7/30:  EF 50% to 55%. Wall motion was normal ;there were no regional wall motion abnormalities, mild AS, MAC, mild LAE, mild RAE, PASP 35, mild pulmonary HTN  . On home O2   . Anxiety   . Depression   . CHF (congestive heart failure)   . Emphysema   . Chronic kidney disease   . Breast cancer   . DEMENTIA 11/04/11    "she has some significant dementia"    Past Surgical History  Procedure Date    . Tonsillectomy   . Appendectomy   . Abdominal hysterectomy   . Mastectomy 1990    right  . Cataract extraction     "don't know if both eyes or if she had lenses put in"    Family History  Problem Relation Age of Onset  . Emphysema    . Heart attack    . Diabetes    . Hypertension    . Cancer    . Anxiety disorder    . Depression    . Coronary artery disease      History  Substance Use Topics  . Smoking status: Never Smoker   . Smokeless tobacco: Never Used  . Alcohol Use: No    OB History    Grav Para Term Preterm Abortions TAB SAB Ect Mult Living                  Review of Systems  Cardiovascular: Positive for syncope.  All other systems reviewed and are negative.    Allergies  Codeine  Home Medications   Current Outpatient Rx  Name Route Sig Dispense Refill  . ALBUTEROL SULFATE HFA 108 (90 BASE) MCG/ACT IN AERS Inhalation Inhale 2 puffs into the lungs every 4 (four) hours as needed. For shortness of breath    . ALPRAZOLAM 0.25 MG PO TABS Oral Take  0.25 mg by mouth at bedtime as needed. For anxiety    . ATORVASTATIN CALCIUM 20 MG PO TABS Oral Take 1 tablet (20 mg total) by mouth at bedtime. 30 tablet 6  . BISOPROLOL FUMARATE 5 MG PO TABS Oral Take 1 tablet (5 mg total) by mouth daily. 30 tablet 6  . OSCAL 500/200 D-3 PO Oral Take 1 tablet by mouth daily.      Marland Kitchen DILTIAZEM HCL ER 180 MG PO CP24 Oral Take 180 mg by mouth daily.    . DONEPEZIL HCL 10 MG PO TABS Oral Take 10 mg by mouth at bedtime.     Marland Kitchen FERROUS SULFATE 325 (65 FE) MG PO TABS Oral Take 325 mg by mouth daily with breakfast.      . FUROSEMIDE 40 MG PO TABS Oral Take 80 mg by mouth 2 (two) times daily. Or as instructed by your physician    . IBUPROFEN 200 MG PO TABS Oral Take 200 mg by mouth every 8 (eight) hours as needed. For pain    . LEVOTHYROXINE SODIUM 100 MCG PO TABS Oral Take 100 mcg by mouth daily.      Marland Kitchen POLYETHYLENE GLYCOL 3350 PO PACK Oral Take 17 g by mouth every other day.     Marland Kitchen  POTASSIUM CHLORIDE CRYS ER 20 MEQ PO TBCR Oral Take 20 mEq by mouth daily.    . SENNOSIDES 8.6 MG PO TABS Oral Take 2 tablets by mouth every other day.     Marland Kitchen TIOTROPIUM BROMIDE MONOHYDRATE 18 MCG IN CAPS Inhalation Place 1 capsule (18 mcg total) into inhaler and inhale daily. 30 capsule 6  . WARFARIN SODIUM 5 MG PO TABS Oral Take 5 mg by mouth daily.      There were no vitals taken for this visit.  Physical Exam  Nursing note and vitals reviewed.  78 year old female who is resting comfortably and in no acute distress. Vital signs are significant for mild tachypnea with respiratory rate of 23, mild to moderate bradycardia with heart rate of 50, and normal blood pressure. Oxygen saturation is 95% which is normal. Head is normocephalic and atraumatic. Pupils are 3 mm and minimally reactive. EOMs are full. Fundi are not able to be visualized due to myosis. Oropharynx is clear. Neck is supple without adenopathy, JVD, or bruit. Back is nontender. Lungs are clear without rales, wheezes, rhonchi. Heart has an irregular rhythm without murmur. Abdomen is soft, flat, nontender without masses or hepatosplenomegaly. Extremities have 1+ edema, no cyanosis. Full range of motion is present. Skin is warm and dry without rash. Neurologic: She is awake, alert, and oriented. Mental status appears normal. Cranial nerves are intact. There no focal motor or sensory deficits. There is no pronator drift.  ED Course  Procedures (including critical care time)  Results for orders placed during the hospital encounter of 03/04/12  COMPREHENSIVE METABOLIC PANEL      Component Value Range   Sodium 138  135 - 145 (mEq/L)   Potassium 5.1  3.5 - 5.1 (mEq/L)   Chloride 95 (*) 96 - 112 (mEq/L)   CO2 34 (*) 19 - 32 (mEq/L)   Glucose, Bld 114 (*) 70 - 99 (mg/dL)   BUN 31 (*) 6 - 23 (mg/dL)   Creatinine, Ser 4.09 (*) 0.50 - 1.10 (mg/dL)   Calcium 9.5  8.4 - 81.1 (mg/dL)   Total Protein 5.9 (*) 6.0 - 8.3 (g/dL)   Albumin 3.2 (*)  3.5 - 5.2 (g/dL)   AST 22  0 - 37 (U/L)   ALT 13  0 - 35 (U/L)   Alkaline Phosphatase 52  39 - 117 (U/L)   Total Bilirubin 0.4  0.3 - 1.2 (mg/dL)   GFR calc non Af Amer 26 (*) >90 (mL/min)   GFR calc Af Amer 30 (*) >90 (mL/min)  PROTIME-INR      Component Value Range   Prothrombin Time 19.8 (*) 11.6 - 15.2 (seconds)   INR 1.65 (*) 0.00 - 1.49   TROPONIN I      Component Value Range   Troponin I <0.30  <0.30 (ng/mL)  TROPONIN I      Component Value Range   Troponin I <0.30  <0.30 (ng/mL)  CBC      Component Value Range   WBC 8.5  4.0 - 10.5 (K/uL)   RBC 3.27 (*) 3.87 - 5.11 (MIL/uL)   Hemoglobin 9.7 (*) 12.0 - 15.0 (g/dL)   HCT 40.9 (*) 81.1 - 46.0 (%)   MCV 96.0  78.0 - 100.0 (fL)   MCH 29.7  26.0 - 34.0 (pg)   MCHC 30.9  30.0 - 36.0 (g/dL)   RDW 91.4  78.2 - 95.6 (%)   Platelets 148 (*) 150 - 400 (K/uL)  DIFFERENTIAL      Component Value Range   Neutrophils Relative 81 (*) 43 - 77 (%)   Neutro Abs 6.9  1.7 - 7.7 (K/uL)   Lymphocytes Relative 9 (*) 12 - 46 (%)   Lymphs Abs 0.8  0.7 - 4.0 (K/uL)   Monocytes Relative 9  3 - 12 (%)   Monocytes Absolute 0.8  0.1 - 1.0 (K/uL)   Eosinophils Relative 1  0 - 5 (%)   Eosinophils Absolute 0.1  0.0 - 0.7 (K/uL)   Basophils Relative 0  0 - 1 (%)   Basophils Absolute 0.0  0.0 - 0.1 (K/uL)   Dg Chest 1 View  03/04/2012  *RADIOLOGY REPORT*  Clinical Data: Loss of consciousness.  Shortness of breath.  CHEST - 1 VIEW  Comparison: Two-view chest 01/15/2012.  Findings: The heart is enlarged.  Left lower lobe airspace disease is new.  The left pleural effusion is also suggested.  Postsurgical changes are noted in the right axilla.  Chronic changes of the lungs are otherwise stable.  IMPRESSION:  1.  New left lower lobe airspace disease is concerning for pneumonia or aspiration. 2.  Stable cardiomegaly without failure. 3.  Otherwise stable chronic changes.  Original Report Authenticated By: Jamesetta Orleans. MATTERN, M.D.   Ct Head Wo  Contrast  03/04/2012  *RADIOLOGY REPORT*  Clinical Data: Syncope  CT HEAD WITHOUT CONTRAST  Technique:  Contiguous axial images were obtained from the base of the skull through the vertex without contrast.  Comparison: 11/04/2011  Findings:  There is diffuse patchy low density throughout the subcortical and periventricular white matter consistent with chronic small vessel ischemic change.  There is prominence of the sulci and ventricles consistent with brain atrophy.  There is no evidence for acute brain infarct, hemorrhage or mass.  There is mild mucosal thickening involving the right maxillary sinus and sphenoid sinus.  The mastoid air cells are clear.  The skull appears intact.  IMPRESSION:  1.  Small vessel ischemic change and brain atrophy. 2.  No acute intracranial abnormalities.  Original Report Authenticated By: Rosealee Albee, M.D.      Date: 03/04/2012  Rate: 66  Rhythm: atrial fibrillation  QRS Axis: right  Intervals: QT prolonged  ST/T Wave  abnormalities: nonspecific ST/T changes  Conduction Disutrbances:none  Narrative Interpretation: Atrial fibrillation with prolonged QT interval and borderline right axis deviation. Minor nonspecific ST-T changes. When compared with ECG of 02/09/2012, rate is slower, axis has shifted rightward, QT has shortened slightly.  Old EKG Reviewed: changes noted  She has been hemodynamically stable in the emergency department. Labs do show a rise in her BUN and creatinine over baseline and stable hemoglobin. Orthostasis was not able to be reproduced in the emergency department. Her anticoagulation level is subtherapeutic. I discussed case with Dr. Conley Rolls who agrees to admit the patient for cardiac monitoring. Of note, chest x-ray report states left lower lobe airspace disease concerning for pneumonia. She has no symptoms of pneumonia including no fever, no cough, no dyspnea. I have reviewed the images and I feel this is part of her underlying heart failure. There  is no indication for antibiotics.  1. Syncope   2. Atrial fibrillation   3. Anemia   4. Renal insufficiency       MDM  Syncopal episode which sounds like it was a seizure. I suspect that this was due to cerebral hypoperfusion with orthostatic hypotension. Workup has been initiated.        Dione Booze, MD 03/04/12 2217

## 2012-03-04 NOTE — ED Notes (Signed)
Patient states that she is unable to stand at this time.

## 2012-03-05 ENCOUNTER — Other Ambulatory Visit: Payer: Self-pay

## 2012-03-05 DIAGNOSIS — I359 Nonrheumatic aortic valve disorder, unspecified: Secondary | ICD-10-CM

## 2012-03-05 LAB — BASIC METABOLIC PANEL
BUN: 32 mg/dL — ABNORMAL HIGH (ref 6–23)
Chloride: 95 mEq/L — ABNORMAL LOW (ref 96–112)
Glucose, Bld: 102 mg/dL — ABNORMAL HIGH (ref 70–99)
Potassium: 4.1 mEq/L (ref 3.5–5.1)

## 2012-03-05 LAB — PRO B NATRIURETIC PEPTIDE: Pro B Natriuretic peptide (BNP): 4977 pg/mL — ABNORMAL HIGH (ref 0–450)

## 2012-03-05 LAB — CARDIAC PANEL(CRET KIN+CKTOT+MB+TROPI)
CK, MB: 1.1 ng/mL (ref 0.3–4.0)
Relative Index: INVALID (ref 0.0–2.5)
Relative Index: INVALID (ref 0.0–2.5)
Total CK: 28 U/L (ref 7–177)
Troponin I: <0.3 ng/mL (ref <0.30)

## 2012-03-05 LAB — CBC
HCT: 30.5 % — ABNORMAL LOW (ref 36.0–46.0)
Hemoglobin: 9.5 g/dL — ABNORMAL LOW (ref 12.0–15.0)
MCH: 30.3 pg (ref 26.0–34.0)
MCHC: 31.1 g/dL (ref 30.0–36.0)

## 2012-03-05 MED ORDER — SODIUM CHLORIDE 0.9 % IV SOLN: 250.0000 mL | INTRAVENOUS | Status: DC | PRN

## 2012-03-05 MED ORDER — DOXYCYCLINE HYCLATE 100 MG PO TABS
100.0000 mg | ORAL_TABLET | Freq: Two times a day (BID) | ORAL | Status: DC
  Administered 2012-03-05 – 2012-03-08 (×6): 100 mg via ORAL
  Filled 2012-03-05 (×7): qty 1

## 2012-03-05 MED ORDER — DILTIAZEM HCL ER 180 MG PO CP24
180.0000 mg | ORAL_CAPSULE | Freq: Every day | ORAL | Status: DC
  Administered 2012-03-05 – 2012-03-08 (×4): 180 mg via ORAL
  Filled 2012-03-05 (×4): qty 1

## 2012-03-05 MED ORDER — CALCIUM CARBONATE-VITAMIN D 500-200 MG-UNIT PO TABS
1.0000 | ORAL_TABLET | Freq: Every day | ORAL | Status: DC
  Administered 2012-03-05 – 2012-03-08 (×4): 1 via ORAL
  Filled 2012-03-05 (×4): qty 1

## 2012-03-05 MED ORDER — WARFARIN SODIUM 7.5 MG PO TABS
7.5000 mg | ORAL_TABLET | Freq: Once | ORAL | Status: AC
  Administered 2012-03-05: 7.5 mg via ORAL
  Filled 2012-03-05: qty 1

## 2012-03-05 MED ORDER — SODIUM CHLORIDE 0.9 % IJ SOLN
3.0000 mL | Freq: Two times a day (BID) | INTRAMUSCULAR | Status: DC
  Administered 2012-03-06: 3 mL via INTRAVENOUS

## 2012-03-05 MED ORDER — SODIUM CHLORIDE 0.9 % IJ SOLN
3.0000 mL | Freq: Two times a day (BID) | INTRAMUSCULAR | Status: DC
  Administered 2012-03-05 – 2012-03-08 (×8): 3 mL via INTRAVENOUS

## 2012-03-05 MED ORDER — POLYETHYLENE GLYCOL 3350 17 G PO PACK
17.0000 g | PACK | ORAL | Status: DC
  Administered 2012-03-07: 17 g via ORAL
  Filled 2012-03-05 (×2): qty 1

## 2012-03-05 MED ORDER — WARFARIN - PHARMACIST DOSING INPATIENT
Freq: Every day | Status: DC
  Administered 2012-03-05 – 2012-03-07 (×2)

## 2012-03-05 MED ORDER — ALBUTEROL SULFATE HFA 108 (90 BASE) MCG/ACT IN AERS
2.0000 | INHALATION_SPRAY | RESPIRATORY_TRACT | Status: DC | PRN
  Filled 2012-03-05: qty 6.7

## 2012-03-05 MED ORDER — ALPRAZOLAM 0.25 MG PO TABS
0.2500 mg | ORAL_TABLET | Freq: Every evening | ORAL | Status: DC | PRN
  Administered 2012-03-07: 0.25 mg via ORAL
  Filled 2012-03-05: qty 1

## 2012-03-05 MED ORDER — POTASSIUM CHLORIDE CRYS ER 20 MEQ PO TBCR
20.0000 meq | EXTENDED_RELEASE_TABLET | Freq: Every day | ORAL | Status: DC
  Administered 2012-03-05 – 2012-03-08 (×4): 20 meq via ORAL
  Filled 2012-03-05 (×4): qty 1

## 2012-03-05 MED ORDER — SENNOSIDES 8.6 MG PO TABS: 2.0000 | ORAL_TABLET | ORAL | Status: DC

## 2012-03-05 MED ORDER — DILTIAZEM HCL ER 180 MG PO CP24: 180.0000 mg | ORAL_CAPSULE | Freq: Every day | ORAL | Status: DC

## 2012-03-05 MED ORDER — SIMVASTATIN 40 MG PO TABS
40.0000 mg | ORAL_TABLET | Freq: Every day | ORAL | Status: DC
  Filled 2012-03-05: qty 1

## 2012-03-05 MED ORDER — ALBUTEROL SULFATE (5 MG/ML) 0.5% IN NEBU
2.5000 mg | INHALATION_SOLUTION | Freq: Four times a day (QID) | RESPIRATORY_TRACT | Status: DC
  Administered 2012-03-05 – 2012-03-08 (×11): 2.5 mg via RESPIRATORY_TRACT
  Filled 2012-03-05 (×12): qty 0.5

## 2012-03-05 MED ORDER — SODIUM CHLORIDE 0.9 % IJ SOLN: 3.0000 mL | INTRAMUSCULAR | Status: DC | PRN

## 2012-03-05 MED ORDER — WARFARIN SODIUM 5 MG PO TABS
5.0000 mg | ORAL_TABLET | Freq: Once | ORAL | Status: AC
  Administered 2012-03-05: 5 mg via ORAL
  Filled 2012-03-05: qty 1

## 2012-03-05 MED ORDER — TIOTROPIUM BROMIDE MONOHYDRATE 18 MCG IN CAPS
18.0000 ug | ORAL_CAPSULE | Freq: Every day | RESPIRATORY_TRACT | Status: DC
  Administered 2012-03-06 – 2012-03-08 (×3): 18 ug via RESPIRATORY_TRACT
  Filled 2012-03-05 (×2): qty 5

## 2012-03-05 MED ORDER — DEXTROSE 5 % IV SOLN
1.0000 g | INTRAVENOUS | Status: DC
  Administered 2012-03-05 – 2012-03-07 (×3): 1 g via INTRAVENOUS
  Filled 2012-03-05 (×4): qty 10

## 2012-03-05 MED ORDER — BISOPROLOL FUMARATE 5 MG PO TABS
5.0000 mg | ORAL_TABLET | Freq: Every day | ORAL | Status: DC
  Administered 2012-03-05 – 2012-03-08 (×4): 5 mg via ORAL
  Filled 2012-03-05 (×4): qty 1

## 2012-03-05 MED ORDER — LEVOTHYROXINE SODIUM 100 MCG PO TABS
100.0000 ug | ORAL_TABLET | Freq: Every day | ORAL | Status: DC
  Administered 2012-03-05 – 2012-03-08 (×4): 100 ug via ORAL
  Filled 2012-03-05 (×5): qty 1

## 2012-03-05 MED ORDER — DONEPEZIL HCL 10 MG PO TABS
10.0000 mg | ORAL_TABLET | Freq: Every day | ORAL | Status: DC
  Administered 2012-03-05 – 2012-03-07 (×3): 10 mg via ORAL
  Filled 2012-03-05 (×4): qty 1

## 2012-03-05 MED ORDER — SENNA 8.6 MG PO TABS
2.0000 | ORAL_TABLET | ORAL | Status: DC
  Administered 2012-03-05 – 2012-03-07 (×2): 17.2 mg via ORAL
  Filled 2012-03-05 (×2): qty 2

## 2012-03-05 MED ORDER — ATORVASTATIN CALCIUM 20 MG PO TABS
20.0000 mg | ORAL_TABLET | Freq: Every day | ORAL | Status: DC
  Administered 2012-03-05 – 2012-03-07 (×3): 20 mg via ORAL
  Filled 2012-03-05 (×4): qty 1

## 2012-03-05 MED ORDER — FERROUS SULFATE 325 (65 FE) MG PO TABS
325.0000 mg | ORAL_TABLET | Freq: Every day | ORAL | Status: DC
Start: 2012-03-05 — End: 2012-03-08
  Administered 2012-03-05 – 2012-03-08 (×4): 325 mg via ORAL
  Filled 2012-03-05 (×5): qty 1

## 2012-03-05 MED ORDER — ALBUTEROL SULFATE (5 MG/ML) 0.5% IN NEBU: 2.5000 mg | INHALATION_SOLUTION | RESPIRATORY_TRACT | Status: DC | PRN

## 2012-03-05 MED ORDER — FUROSEMIDE 80 MG PO TABS
80.0000 mg | ORAL_TABLET | Freq: Two times a day (BID) | ORAL | Status: DC
  Administered 2012-03-05: 80 mg via ORAL
  Filled 2012-03-05 (×2): qty 1

## 2012-03-05 NOTE — H&P (Signed)
PCP:   Herb Grays, MD, MD   Chief Complaint: Syncope   HPI: [name] is an 78 y.o. female with history of COPD, hyperlipidemia, hypertension, hypothyroidism, chronic atrial fibrillation on anticoagulation, dementia, history of congestive heart failure, on home O2, was standing up from the bathroom, and had sudden loss of consciousness. She did not have any postictal confusion, but her body was stiff. She was started CPR, but told EMS "stop pounding on my chest". Evaluation in the emergency room included an EKG which showed atrial fibrillation, rate controlled, but with QTC of 515 ms. For head CT was unremarkable for any acute process, her chest x-ray showed question of a new left lower lobe infiltrate. She has no fever, cough, or leukocytosis. Troponin was negative. Hospitalist was asked to admit her for workup of her syncope.  Rewiew of Systems:  The patient denies anorexia, fever, weight loss,, vision loss, decreased hearing, hoarseness, chest pain,  dyspnea on exertion, peripheral edema, balance deficits, hemoptysis, abdominal pain, melena, hematochezia, severe indigestion/heartburn, hematuria, incontinence, genital sores, muscle weakness, suspicious skin lesions, transient blindness, difficulty walking, depression, unusual weight change, abnormal bleeding, enlarged lymph nodes, angioedema, and breast masses.    Past Medical History  Diagnosis Date  . COPD (chronic obstructive pulmonary disease)   . Hyperlipidemia   . Hypertension   . Osteoporosis   . Hypothyroid   . Memory loss   . Anemia     Presumed GI on iron replacement hemoglobin in the 10 range  . Atrial flutter     Coumadin initiated 7/12 and followed by PCP; patient felt to be high risk for RFCA secondary to COPD;  echo was done 7/30:  EF 50% to 55%. Wall motion was normal ;there were no regional wall motion abnormalities, mild AS, MAC, mild LAE, mild RAE, PASP 35, mild pulmonary HTN  . On home O2   . Anxiety   .  Depression   . CHF (congestive heart failure)   . Emphysema   . Chronic kidney disease   . Breast cancer   . DEMENTIA 11/04/11    "she has some significant dementia"    Past Surgical History  Procedure Date  . Tonsillectomy   . Appendectomy   . Abdominal hysterectomy   . Mastectomy 1990    right  . Cataract extraction     "don't know if both eyes or if she had lenses put in"    Medications:  HOME MEDS: Prior to Admission medications   Medication Sig Start Date End Date Taking? Authorizing Provider  albuterol (PROAIR HFA) 108 (90 BASE) MCG/ACT inhaler Inhale 2 puffs into the lungs every 4 (four) hours as needed. For shortness of breath   Yes Historical Provider, MD  ALPRAZolam (XANAX) 0.25 MG tablet Take 0.25 mg by mouth at bedtime as needed. For anxiety   Yes Historical Provider, MD  atorvastatin (LIPITOR) 20 MG tablet Take 1 tablet (20 mg total) by mouth at bedtime. 01/01/12  Yes Hadassah Pais, PA  bisoprolol (ZEBETA) 5 MG tablet Take 1 tablet (5 mg total) by mouth daily. 01/01/12 12/31/12 Yes Hadassah Pais, PA  Calcium Carbonate-Vitamin D (OSCAL 500/200 D-3 PO) Take 1 tablet by mouth daily.     Yes Historical Provider, MD  diltiazem (DILACOR XR) 180 MG 24 hr capsule Take 180 mg by mouth daily. 02/09/12 02/08/13 Yes Bevelyn Buckles Bensimhon, MD  donepezil (ARICEPT) 10 MG tablet Take 10 mg by mouth at bedtime.    Yes Historical Provider, MD  ferrous sulfate 325 (65 FE) MG tablet Take 325 mg by mouth daily with breakfast.     Yes Historical Provider, MD  furosemide (LASIX) 40 MG tablet Take 80 mg by mouth 2 (two) times daily. Or as instructed by your physician 02/02/12  Yes Duke Salvia, MD  ibuprofen (ADVIL,MOTRIN) 200 MG tablet Take 200 mg by mouth every 8 (eight) hours as needed. For pain   Yes Historical Provider, MD  levothyroxine (SYNTHROID, LEVOTHROID) 100 MCG tablet Take 100 mcg by mouth daily.     Yes Historical Provider, MD  polyethylene glycol (MIRALAX / GLYCOLAX) packet Take  17 g by mouth every other day.    Yes Historical Provider, MD  potassium chloride SA (KLOR-CON M20) 20 MEQ tablet Take 20 mEq by mouth daily.   Yes Historical Provider, MD  senna (SENOKOT) 8.6 MG tablet Take 2 tablets by mouth every other day.    Yes Historical Provider, MD  tiotropium (SPIRIVA HANDIHALER) 18 MCG inhalation capsule Place 1 capsule (18 mcg total) into inhaler and inhale daily. 11/19/11 11/18/12 Yes Storm Frisk, MD  warfarin (COUMADIN) 5 MG tablet Take 5 mg by mouth daily. 01/01/12  Yes Hadassah Pais, PA     Allergies:  Allergies  Allergen Reactions  . Codeine     REACTION: nausea    Social History:   reports that she has never smoked. She has never used smokeless tobacco. She reports that she does not drink alcohol or use illicit drugs.  Family History: Family History  Problem Relation Age of Onset  . Emphysema    . Heart attack    . Diabetes    . Hypertension    . Cancer    . Anxiety disorder    . Depression    . Coronary artery disease       Physical Exam: Filed Vitals:   03/05/12 0015 03/05/12 0030 03/05/12 0033 03/05/12 0103  BP: 113/73 117/64  120/68  Pulse: 76 124    Temp:   98 F (36.7 C)   TempSrc:   Oral   Resp: 22 20    SpO2: 95% 97%     Blood pressure 120/68, pulse 124, temperature 98 F (36.7 C), temperature source Oral, resp. rate 20, SpO2 97.00%.  GEN:  Pleasant person lying in the stretcher in no acute distress; cooperative with exam PSYCH:  She is a bit confused; does not appear anxious does not appear depressed; affect is normal HEENT: Mucous membranes pink and anicteric; PERRLA; EOM intact; no cervical lymphadenopathy nor thyromegaly or carotid bruit; no JVD; Breasts:: Not examined CHEST WALL: No tenderness CHEST: Normal respiration, clear to auscultation bilaterally HEART: Irregular rate and rhythm.; no murmurs rubs or gallops BACK: No kyphosis or scoliosis; no CVA tenderness ABDOMEN: Obese, soft non-tender; no masses, no  organomegaly, normal abdominal bowel sounds; no pannus; no intertriginous candida. Rectal Exam: Not done EXTREMITIES: No bone or joint deformity; age-appropriate arthropathy of the hands and knees; no edema; no ulcerations. Genitalia: not examined PULSES: 2+ and symmetric SKIN: Normal hydration no rash or ulceration CNS: Cranial nerves 2-12 grossly intact no focal neurologic deficit   Labs & Imaging Results for orders placed during the hospital encounter of 03/04/12 (from the past 48 hour(s))  COMPREHENSIVE METABOLIC PANEL     Status: Abnormal   Collection Time   03/04/12  8:26 PM      Component Value Range Comment   Sodium 138  135 - 145 (mEq/L)    Potassium 5.1  3.5 - 5.1 (mEq/L)    Chloride 95 (*) 96 - 112 (mEq/L)    CO2 34 (*) 19 - 32 (mEq/L)    Glucose, Bld 114 (*) 70 - 99 (mg/dL)    BUN 31 (*) 6 - 23 (mg/dL)    Creatinine, Ser 4.09 (*) 0.50 - 1.10 (mg/dL)    Calcium 9.5  8.4 - 10.5 (mg/dL)    Total Protein 5.9 (*) 6.0 - 8.3 (g/dL)    Albumin 3.2 (*) 3.5 - 5.2 (g/dL)    AST 22  0 - 37 (U/L)    ALT 13  0 - 35 (U/L)    Alkaline Phosphatase 52  39 - 117 (U/L)    Total Bilirubin 0.4  0.3 - 1.2 (mg/dL)    GFR calc non Af Amer 26 (*) >90 (mL/min)    GFR calc Af Amer 30 (*) >90 (mL/min)   PROTIME-INR     Status: Abnormal   Collection Time   03/04/12  8:26 PM      Component Value Range Comment   Prothrombin Time 19.8 (*) 11.6 - 15.2 (seconds)    INR 1.65 (*) 0.00 - 1.49    TROPONIN I     Status: Normal   Collection Time   03/04/12  8:26 PM      Component Value Range Comment   Troponin I <0.30  <0.30 (ng/mL)   CBC     Status: Abnormal   Collection Time   03/04/12  8:55 PM      Component Value Range Comment   WBC 8.5  4.0 - 10.5 (K/uL)    RBC 3.27 (*) 3.87 - 5.11 (MIL/uL)    Hemoglobin 9.7 (*) 12.0 - 15.0 (g/dL)    HCT 81.1 (*) 91.4 - 46.0 (%)    MCV 96.0  78.0 - 100.0 (fL)    MCH 29.7  26.0 - 34.0 (pg)    MCHC 30.9  30.0 - 36.0 (g/dL)    RDW 78.2  95.6 - 21.3 (%)     Platelets 148 (*) 150 - 400 (K/uL)   DIFFERENTIAL     Status: Abnormal   Collection Time   03/04/12  8:55 PM      Component Value Range Comment   Neutrophils Relative 81 (*) 43 - 77 (%)    Neutro Abs 6.9  1.7 - 7.7 (K/uL)    Lymphocytes Relative 9 (*) 12 - 46 (%)    Lymphs Abs 0.8  0.7 - 4.0 (K/uL)    Monocytes Relative 9  3 - 12 (%)    Monocytes Absolute 0.8  0.1 - 1.0 (K/uL)    Eosinophils Relative 1  0 - 5 (%)    Eosinophils Absolute 0.1  0.0 - 0.7 (K/uL)    Basophils Relative 0  0 - 1 (%)    Basophils Absolute 0.0  0.0 - 0.1 (K/uL)   TROPONIN I     Status: Normal   Collection Time   03/04/12  9:00 PM      Component Value Range Comment   Troponin I <0.30  <0.30 (ng/mL)    Dg Chest 1 View  03/04/2012  *RADIOLOGY REPORT*  Clinical Data: Loss of consciousness.  Shortness of breath.  CHEST - 1 VIEW  Comparison: Two-view chest 01/15/2012.  Findings: The heart is enlarged.  Left lower lobe airspace disease is new.  The left pleural effusion is also suggested.  Postsurgical changes are noted in the right axilla.  Chronic changes of the lungs are otherwise  stable.  IMPRESSION:  1.  New left lower lobe airspace disease is concerning for pneumonia or aspiration. 2.  Stable cardiomegaly without failure. 3.  Otherwise stable chronic changes.  Original Report Authenticated By: Jamesetta Orleans. MATTERN, M.D.   Ct Head Wo Contrast  03/04/2012  *RADIOLOGY REPORT*  Clinical Data: Syncope  CT HEAD WITHOUT CONTRAST  Technique:  Contiguous axial images were obtained from the base of the skull through the vertex without contrast.  Comparison: 11/04/2011  Findings:  There is diffuse patchy low density throughout the subcortical and periventricular white matter consistent with chronic small vessel ischemic change.  There is prominence of the sulci and ventricles consistent with brain atrophy.  There is no evidence for acute brain infarct, hemorrhage or mass.  There is mild mucosal thickening involving the right  maxillary sinus and sphenoid sinus.  The mastoid air cells are clear.  The skull appears intact.  IMPRESSION:  1.  Small vessel ischemic change and brain atrophy. 2.  No acute intracranial abnormalities.  Original Report Authenticated By: Rosealee Albee, M.D.      Assessment Present on Admission:  .Syncope and collapse .HYPERTENSION .Atrial fib/flutter .CAP (community acquired pneumonia) .Chronic diastolic heart failure .Hypothyroidism   PLAN: Will admit her for observation, monitor her rhythm, and cycle her cardiac markers. She does have valvular disease, and with her syncope, will obtain a cardiac echo. She might be slightly volume depleted, but with her history of heart failure, I will not give her IV fluid and but will hold her Lasix for now. It's possible she aspirated a bit when she lost her consciousness, and we will follow her on no antibiotics. For her hypothyroidism, will continue supplement and check a TSH. I reconfirmed  her CODE STATUS of DO NOT RESUSCITATE tonight. We will honor it. She is stable and will be admitted to telemetry under triad hospitalist service. Will continue her anticoagulation per pharmacy for her chronic A. fib. We will try to avoid medications that cause further prolongation of her QTC.   Other plans as per orders.    Anjelo Pullman 03/05/2012, 1:17 AM

## 2012-03-05 NOTE — Progress Notes (Signed)
ANTICOAGULATION CONSULT NOTE - Initial Consult  Pharmacy Consult for Coumadin Indication: Aflutter  Allergies  Allergen Reactions  . Codeine     REACTION: nausea   Vital Signs: Temp: 98 F (36.7 C) (03/22 0033) Temp src: Oral (03/22 0033) BP: 120/68 mmHg (03/22 0103) Pulse Rate: 124  (03/22 0030)  Labs:  Alvira Philips 03/04/12 2100 03/04/12 2055 03/04/12 2026  HGB -- 9.7* --  HCT -- 31.4* --  PLT -- 148* --  APTT -- -- --  LABPROT -- -- 19.8*  INR -- -- 1.65*  HEPARINUNFRC -- -- --  CREATININE -- -- 1.68*  CKTOTAL -- -- --  CKMB -- -- --  TROPONINI <0.30 -- <0.30   The CrCl is unknown because both a height and weight (above a minimum accepted value) are required for this calculation.  Medical History: Past Medical History  Diagnosis Date  . COPD (chronic obstructive pulmonary disease)   . Hyperlipidemia   . Hypertension   . Osteoporosis   . Hypothyroid   . Memory loss   . Anemia     Presumed GI on iron replacement hemoglobin in the 10 range  . Atrial flutter     Coumadin initiated 7/12 and followed by PCP; patient felt to be high risk for RFCA secondary to COPD;  echo was done 7/30:  EF 50% to 55%. Wall motion was normal ;there were no regional wall motion abnormalities, mild AS, MAC, mild LAE, mild RAE, PASP 35, mild pulmonary HTN  . On home O2   . Anxiety   . Depression   . CHF (congestive heart failure)   . Emphysema   . Chronic kidney disease   . Breast cancer   . DEMENTIA 11/04/11    "she has some significant dementia"    Medications:  Prescriptions prior to admission  Medication Sig Dispense Refill  . albuterol (PROAIR HFA) 108 (90 BASE) MCG/ACT inhaler Inhale 2 puffs into the lungs every 4 (four) hours as needed. For shortness of breath      . ALPRAZolam (XANAX) 0.25 MG tablet Take 0.25 mg by mouth at bedtime as needed. For anxiety      . atorvastatin (LIPITOR) 20 MG tablet Take 1 tablet (20 mg total) by mouth at bedtime.  30 tablet  6  . bisoprolol  (ZEBETA) 5 MG tablet Take 1 tablet (5 mg total) by mouth daily.  30 tablet  6  . Calcium Carbonate-Vitamin D (OSCAL 500/200 D-3 PO) Take 1 tablet by mouth daily.        Marland Kitchen diltiazem (DILACOR XR) 180 MG 24 hr capsule Take 180 mg by mouth daily.      Marland Kitchen donepezil (ARICEPT) 10 MG tablet Take 10 mg by mouth at bedtime.       . ferrous sulfate 325 (65 FE) MG tablet Take 325 mg by mouth daily with breakfast.        . furosemide (LASIX) 40 MG tablet Take 80 mg by mouth 2 (two) times daily. Or as instructed by your physician      . ibuprofen (ADVIL,MOTRIN) 200 MG tablet Take 200 mg by mouth every 8 (eight) hours as needed. For pain      . levothyroxine (SYNTHROID, LEVOTHROID) 100 MCG tablet Take 100 mcg by mouth daily.        . polyethylene glycol (MIRALAX / GLYCOLAX) packet Take 17 g by mouth every other day.       . potassium chloride SA (KLOR-CON M20) 20 MEQ tablet Take 20 mEq  by mouth daily.      Marland Kitchen senna (SENOKOT) 8.6 MG tablet Take 2 tablets by mouth every other day.       . tiotropium (SPIRIVA HANDIHALER) 18 MCG inhalation capsule Place 1 capsule (18 mcg total) into inhaler and inhale daily.  30 capsule  6  . warfarin (COUMADIN) 5 MG tablet Take 5 mg by mouth daily.        Assessment: 78 yo female with Atrial flutter for anticoagulation   Goal of Therapy:  INR 2-3   Plan:  Coumadin 7.5 mg tonight.   F/U daily PT/INR  Halee Glynn, Gary Fleet 03/05/2012,1:16 AM

## 2012-03-05 NOTE — Progress Notes (Signed)
  Echocardiogram 2D Echocardiogram has been performed.  Treyana Sturgell, Real Cons 03/05/2012, 4:48 PM

## 2012-03-05 NOTE — Progress Notes (Signed)
ANTICOAGULATION CONSULT NOTE - Initial Consult  Pharmacy Consult for Coumadin Indication: Aflutter  Allergies  Allergen Reactions  . Codeine     REACTION: nausea   Vital Signs: Temp: 98.3 F (36.8 C) (03/22 0500) Temp src: Oral (03/22 0500) BP: 136/84 mmHg (03/22 0500) Pulse Rate: 82  (03/22 0500)  Labs:  Alvira Philips 03/05/12 0808 03/05/12 0505 03/05/12 0104 03/04/12 2100 03/04/12 2055 03/04/12 2026  HGB -- 9.5* -- -- 9.7* --  HCT -- 30.5* -- -- 31.4* --  PLT -- 149* -- -- 148* --  APTT -- -- -- -- -- --  LABPROT -- 19.1* -- -- -- 19.8*  INR -- 1.57* -- -- -- 1.65*  HEPARINUNFRC -- -- -- -- -- --  CREATININE -- 1.66* -- -- -- 1.68*  CKTOTAL PENDING -- 32 -- -- --  CKMB 1.1 -- 1.2 -- -- --  TROPONINI <0.30 -- <0.30 <0.30 -- --   Estimated Creatinine Clearance: 25.7 ml/min (by C-G formula based on Cr of 1.66).  Medical History: Past Medical History  Diagnosis Date  . COPD (chronic obstructive pulmonary disease)   . Hyperlipidemia   . Hypertension   . Osteoporosis   . Hypothyroid   . Memory loss   . Anemia     Presumed GI on iron replacement hemoglobin in the 10 range  . Atrial flutter     Coumadin initiated 7/12 and followed by PCP; patient felt to be high risk for RFCA secondary to COPD;  echo was done 7/30:  EF 50% to 55%. Wall motion was normal ;there were no regional wall motion abnormalities, mild AS, MAC, mild LAE, mild RAE, PASP 35, mild pulmonary HTN  . On home O2   . Anxiety   . Depression   . CHF (congestive heart failure)   . Emphysema   . Chronic kidney disease   . Breast cancer   . DEMENTIA 11/04/11    "she has some significant dementia"    Medications:  Prescriptions prior to admission  Medication Sig Dispense Refill  . albuterol (PROAIR HFA) 108 (90 BASE) MCG/ACT inhaler Inhale 2 puffs into the lungs every 4 (four) hours as needed. For shortness of breath      . ALPRAZolam (XANAX) 0.25 MG tablet Take 0.25 mg by mouth at bedtime as needed. For  anxiety      . atorvastatin (LIPITOR) 20 MG tablet Take 1 tablet (20 mg total) by mouth at bedtime.  30 tablet  6  . bisoprolol (ZEBETA) 5 MG tablet Take 1 tablet (5 mg total) by mouth daily.  30 tablet  6  . Calcium Carbonate-Vitamin D (OSCAL 500/200 D-3 PO) Take 1 tablet by mouth daily.        Marland Kitchen diltiazem (DILACOR XR) 180 MG 24 hr capsule Take 180 mg by mouth daily.      Marland Kitchen donepezil (ARICEPT) 10 MG tablet Take 10 mg by mouth at bedtime.       . ferrous sulfate 325 (65 FE) MG tablet Take 325 mg by mouth daily with breakfast.        . furosemide (LASIX) 40 MG tablet Take 80 mg by mouth 2 (two) times daily. Or as instructed by your physician      . ibuprofen (ADVIL,MOTRIN) 200 MG tablet Take 200 mg by mouth every 8 (eight) hours as needed. For pain      . levothyroxine (SYNTHROID, LEVOTHROID) 100 MCG tablet Take 100 mcg by mouth daily.        Marland Kitchen  polyethylene glycol (MIRALAX / GLYCOLAX) packet Take 17 g by mouth every other day.       . potassium chloride SA (KLOR-CON M20) 20 MEQ tablet Take 20 mEq by mouth daily.      Marland Kitchen senna (SENOKOT) 8.6 MG tablet Take 2 tablets by mouth every other day.       . tiotropium (SPIRIVA HANDIHALER) 18 MCG inhalation capsule Place 1 capsule (18 mcg total) into inhaler and inhale daily.  30 capsule  6  . warfarin (COUMADIN) 5 MG tablet Take 5 mg by mouth daily.        Assessment: 78 yo female with Atrial flutter for anticoagulation.  Her INR was subtherapeutic and her dose given this morning was to make up for a missed dose on 3/21.  She is OK to receive a subsequent dose this evening.   Goal of Therapy:  INR 2-3   Plan:  Coumadin 5 mg tonight.   F/U daily PT/INR  Estella Husk, Pharm.D., BCPS Clinical Pharmacist  Pager [phone] 03/05/2012, 9:54 AM

## 2012-03-05 NOTE — Progress Notes (Signed)
Utilization Review Completed.[name] T3/22/2013   

## 2012-03-05 NOTE — Progress Notes (Addendum)
Subjective:   This is a late entry. Patient was seen this morning. Chart reviewed. Patient was asking whether she could go home. She denied dyspnea. According to aide Ms. Vida Coffie, patient was ambulating with the help of a walker to go to the bathroom and after taking a few steps became extremely dyspneic started shaking and then passed out. She thinks that patient was not breathing for a brief while but had feeble carotid pulsations. She started to perform CPR and called 911. By this time patient woke up. She indicates that patient is oxygen dependent at 3.5 L per minute and has chronic dyspnea. She thinks patient's breathing is almost to baseline but has periods of intermittent worsening even at home.  Objective  Vital signs in last 24 hours: Filed Vitals:   03/05/12 0500 03/05/12 1023 03/05/12 1401 03/05/12 1700  BP: 136/84 133/85 114/72   Pulse: 82  82   Temp: 98.3 F (36.8 C)  98.5 F (36.9 C)   TempSrc: Oral  Oral   Resp: 19  20   Height:      Weight:      SpO2: 98%  95% 97%   Weight change:  No intake or output data in the 24 hours ending 03/05/12 1738  Physical Exam:  General Exam: Elderly frail female patient who looks chronically ill. Respiratory System: Reduced breath sounds bilaterally with bilateral few expiratory rhonchi but no crackles. Mild increased work of breathing but accessory muscles of not active. Cardiovascular System: First and second heart sounds heard. Irregular rate and rhythm. No JVD/murmurs. Telemetry shows atrial fibrillation in the 80s to 100s. No pedal edema Gastrointestinal System: Abdomen is non distended, soft and normal bowel sounds heard.  Central Nervous System: Alert and oriented to self and partly to place. No focal neurological deficits.  Labs:  Basic Metabolic Panel:  Lab 03/05/12 4098 03/04/12 2026  NA 139 138  K 4.1 5.1  CL 95* 95*  CO2 39* 34*  GLUCOSE 102* 114*  BUN 32* 31*  CREATININE 1.66* 1.68*  CALCIUM 9.5 9.5  ALB -- --   PHOS -- --   Liver Function Tests:  Lab 03/04/12 2026  AST 22  ALT 13  ALKPHOS 52  BILITOT 0.4  PROT 5.9*  ALBUMIN 3.2*   No results found for this basename: LIPASE:3,AMYLASE:3 in the last 168 hours No results found for this basename: AMMONIA:3 in the last 168 hours CBC:  Lab 03/05/12 0505 03/04/12 2055  WBC 6.8 8.5  NEUTROABS -- 6.9  HGB 9.5* 9.7*  HCT 30.5* 31.4*  MCV 97.1 96.0  PLT 149* 148*   Cardiac Enzymes:  Lab 03/05/12 0808 03/05/12 0104 03/04/12 2100 03/04/12 2026  CKTOTAL 28 32 -- --  CKMB 1.1 1.2 -- --  CKMBINDEX -- -- -- --  TROPONINI <0.30 <0.30 <0.30 <0.30   CBG: No results found for this basename: GLUCAP:5 in the last 168 hours  Iron Studies: No results found for this basename: IRON,TIBC,TRANSFERRIN,FERRITIN in the last 72 hours Studies/Results: Dg Chest 1 View  03/04/2012  *RADIOLOGY REPORT*  Clinical Data: Loss of consciousness.  Shortness of breath.  CHEST - 1 VIEW  Comparison: Two-view chest 01/15/2012.  Findings: The heart is enlarged.  Left lower lobe airspace disease is new.  The left pleural effusion is also suggested.  Postsurgical changes are noted in the right axilla.  Chronic changes of the lungs are otherwise stable.  IMPRESSION:  1.  New left lower lobe airspace disease is concerning for pneumonia  or aspiration. 2.  Stable cardiomegaly without failure. 3.  Otherwise stable chronic changes.  Original Report Authenticated By: Jamesetta Orleans. MATTERN, M.D.   Ct Head Wo Contrast  03/04/2012  *RADIOLOGY REPORT*  Clinical Data: Syncope  CT HEAD WITHOUT CONTRAST  Technique:  Contiguous axial images were obtained from the base of the skull through the vertex without contrast.  Comparison: 11/04/2011  Findings:  There is diffuse patchy low density throughout the subcortical and periventricular white matter consistent with chronic small vessel ischemic change.  There is prominence of the sulci and ventricles consistent with brain atrophy.  There is no  evidence for acute brain infarct, hemorrhage or mass.  There is mild mucosal thickening involving the right maxillary sinus and sphenoid sinus.  The mastoid air cells are clear.  The skull appears intact.  IMPRESSION:  1.  Small vessel ischemic change and brain atrophy. 2.  No acute intracranial abnormalities.  Original Report Authenticated By: Rosealee Albee, M.D.   Medications:      . albuterol  2.5 mg Nebulization Q6H  . atorvastatin  20 mg Oral q1800  . bisoprolol  5 mg Oral Daily  . calcium-vitamin D  1 tablet Oral Daily  . cefTRIAXone (ROCEPHIN)  IV  1 g Intravenous Q24H  . diltiazem  180 mg Oral Daily  . donepezil  10 mg Oral QHS  . doxycycline  100 mg Oral Q12H  . ferrous sulfate  325 mg Oral Q breakfast  . levothyroxine  100 mcg Oral QAC breakfast  . polyethylene glycol  17 g Oral QODAY  . potassium chloride SA  20 mEq Oral Daily  . senna  2 tablet Oral Q48H  . sodium chloride  3 mL Intravenous Q12H  . sodium chloride  3 mL Intravenous Q12H  . tiotropium  18 mcg Inhalation Daily  . warfarin  5 mg Oral ONCE-1800  . warfarin  7.5 mg Oral Once  . Warfarin - Pharmacist Dosing Inpatient   Does not apply q1800  . DISCONTD: diltiazem  180 mg Oral Daily  . DISCONTD: furosemide  80 mg Oral BID  . DISCONTD: senna  2 tablet Oral QODAY  . DISCONTD: simvastatin  40 mg Oral q1800    I  have reviewed scheduled and prn medications.     Problem/Plan: Active Problems:  HYPERTENSION  Atrial fib/flutter  CAP (community acquired pneumonia)  Chronic diastolic heart failure  Encounter for long-term (current) use of anticoagulants  Syncope and collapse  Hypothyroidism  1. Syncope and collapse: Unclear etiology. Differential diagnosis include worsening dyspnea, orthostatic hypotension, vasovagal syncope. CT head does not show acute findings. Check orthostatic blood pressures. 2-D echocardiogram shows mild LVH, mild aortic valve stenosis and moderate pulmonary hypertension. Follow  carotid Doppler results. We'll get physical therapy and occupational therapy evaluation. 2. Left lower lobe pneumonia: Community-acquired versus aspiration during an episode of vomiting. Treat with intravenous Rocephin and oral doxycycline he did 3. Mild acute on chronic respiratory failure: Acute respiratory failure is possibly secondary to pneumonia and associated mild COPD exacerbation. Chronic respiratory failure secondary to chronic heart failure, pulmonary hypertension and COPD. Treat with IV antibiotics, bronchodilator nebulizations and oxygen. Do not believe she requires steroids at this time. 4. Mild acute renal failure: Possibly secondary to diuretics and associated dehydration. Hold diuretics. Reluctant to give IV fluids secondary to her heart failure history. Encourage oral fluid intake. 5. Chronic diastolic congestive heart failure: Clinically compensated. 6. Hypothyroidism 7. Atrial fibrillation: Controlled ventricular rate and Coumadin as per pharmacy.  Continue Cardizem 8. Presumed respiratory arrest as per history from Aide. 9. Long QT interval: Improved. EKG shows atrial fibrillation, low-voltage and QTC of 459 ms. 10. DO NOT RESUSCITATE  Discussed with patient's son Mr. Olukemi Panchal via telephone and updated care and answered questions.  Aalijah Lanphere 03/05/2012,5:38 PM  LOS: 1 day

## 2012-03-06 LAB — BASIC METABOLIC PANEL
BUN: 26 mg/dL — ABNORMAL HIGH (ref 6–23)
CO2: 37 mEq/L — ABNORMAL HIGH (ref 19–32)
Chloride: 97 mEq/L (ref 96–112)
Creatinine, Ser: 1.29 mg/dL — ABNORMAL HIGH (ref 0.50–1.10)
Potassium: 3.7 mEq/L (ref 3.5–5.1)

## 2012-03-06 LAB — PROTIME-INR: Prothrombin Time: 24.6 seconds — ABNORMAL HIGH (ref 11.6–15.2)

## 2012-03-06 MED ORDER — WARFARIN SODIUM 5 MG PO TABS
5.0000 mg | ORAL_TABLET | Freq: Once | ORAL | Status: AC
  Administered 2012-03-06: 5 mg via ORAL
  Filled 2012-03-06: qty 1

## 2012-03-06 MED ORDER — FUROSEMIDE 40 MG PO TABS
60.0000 mg | ORAL_TABLET | Freq: Two times a day (BID) | ORAL | Status: DC
  Administered 2012-03-06 – 2012-03-08 (×4): 60 mg via ORAL
  Filled 2012-03-06 (×6): qty 1

## 2012-03-06 NOTE — Progress Notes (Signed)
ANTICOAGULATION CONSULT NOTE - Follow Up Consult  Pharmacy Consult for Coumadin Indication: Aflutter  Allergies  Allergen Reactions  . Codeine     REACTION: nausea    Patient Measurements: Height: 5\' 4"  (162.6 cm) Weight: 194 lb 9.6 oz (88.27 kg) IBW/kg (Calculated) : 54.7  Heparin Dosing Weight:   Vital Signs:    Labs:  Basename 03/06/12 0920 03/06/12 0500 03/05/12 1708 03/05/12 0808 03/05/12 0505 03/05/12 0104 03/04/12 2055 03/04/12 2026  HGB -- -- -- -- 9.5* -- 9.7* --  HCT -- -- -- -- 30.5* -- 31.4* --  PLT -- -- -- -- 149* -- 148* --  APTT -- -- -- -- -- -- -- --  LABPROT 24.6* -- -- -- 19.1* -- -- 19.8*  INR 2.18* -- -- -- 1.57* -- -- 1.65*  HEPARINUNFRC -- -- -- -- -- -- -- --  CREATININE -- 1.29* -- -- 1.66* -- -- 1.68*  CKTOTAL -- -- 27 28 -- 32 -- --  CKMB -- -- 1.1 1.1 -- 1.2 -- --  TROPONINI -- -- <0.30 <0.30 -- <0.30 -- --   Estimated Creatinine Clearance: 33 ml/min (by C-G formula based on Cr of 1.29).    Assessment: Pt's INR is 2.18. Pt is afebrile. Bp 107-133/57-85 Goal of Therapy:  INR 2-3   Plan:  Will give 5 mg and f/u daily PT/INR  Eugene Garnet 03/06/2012,10:38 AM

## 2012-03-06 NOTE — Progress Notes (Signed)
Subjective:   Patient denies complaints. She denies cough or dyspnea.  Objective  Vital signs in last 24 hours: Filed Vitals:   03/05/12 2013 03/05/12 2130 03/06/12 0815 03/06/12 1111  BP:  107/57  122/65  Pulse:  75  83  Temp:  98.2 F (36.8 C)    TempSrc:      Resp:  18    Height:      Weight:      SpO2: 97% 95% 96%    Weight change:   Intake/Output Summary (Last 24 hours) at 03/06/12 1205 Last data filed at 03/06/12 0900  Gross per 24 hour  Intake    243 ml  Output      0 ml  Net    243 ml    Physical Exam:  General Exam: Elderly frail female patient who looks chronically ill. Respiratory System: Improved breath sounds bilaterally with occasional basal expiratory rhonchi. No increased work of breathing. Cardiovascular System: First and second heart sounds heard. Irregular rate and rhythm. No JVD/murmurs. Telemetry shows atrial fibrillation in the 80s. No pedal edema Gastrointestinal System: Abdomen is non distended, soft and normal bowel sounds heard.  Central Nervous System: Alert and oriented to self and partly to place. No focal neurological deficits.  Labs:  Basic Metabolic Panel:  Lab 03/06/12 2841 03/05/12 0505 03/04/12 2026  NA 141 139 138  K 3.7 4.1 5.1  CL 97 95* 95*  CO2 37* 39* 34*  GLUCOSE 101* 102* 114*  BUN 26* 32* 31*  CREATININE 1.29* 1.66* 1.68*  CALCIUM 9.4 9.5 9.5  ALB -- -- --  PHOS -- -- --   Liver Function Tests:  Lab 03/04/12 2026  AST 22  ALT 13  ALKPHOS 52  BILITOT 0.4  PROT 5.9*  ALBUMIN 3.2*   No results found for this basename: LIPASE:3,AMYLASE:3 in the last 168 hours No results found for this basename: AMMONIA:3 in the last 168 hours CBC:  Lab 03/05/12 0505 03/04/12 2055  WBC 6.8 8.5  NEUTROABS -- 6.9  HGB 9.5* 9.7*  HCT 30.5* 31.4*  MCV 97.1 96.0  PLT 149* 148*   Cardiac Enzymes:  Lab 03/05/12 1708 03/05/12 0808 03/05/12 0104 03/04/12 2100 03/04/12 2026  CKTOTAL 27 28 32 -- --  CKMB 1.1 1.1 1.2 -- --    CKMBINDEX -- -- -- -- --  TROPONINI <0.30 <0.30 <0.30 <0.30 <0.30   CBG: No results found for this basename: GLUCAP:5 in the last 168 hours  Iron Studies: No results found for this basename: IRON,TIBC,TRANSFERRIN,FERRITIN in the last 72 hours Studies/Results: Dg Chest 1 View  03/04/2012  *RADIOLOGY REPORT*  Clinical Data: Loss of consciousness.  Shortness of breath.  CHEST - 1 VIEW  Comparison: Two-view chest 01/15/2012.  Findings: The heart is enlarged.  Left lower lobe airspace disease is new.  The left pleural effusion is also suggested.  Postsurgical changes are noted in the right axilla.  Chronic changes of the lungs are otherwise stable.  IMPRESSION:  1.  New left lower lobe airspace disease is concerning for pneumonia or aspiration. 2.  Stable cardiomegaly without failure. 3.  Otherwise stable chronic changes.  Original Report Authenticated By: Jamesetta Orleans. MATTERN, M.D.   Ct Head Wo Contrast  03/04/2012  *RADIOLOGY REPORT*  Clinical Data: Syncope  CT HEAD WITHOUT CONTRAST  Technique:  Contiguous axial images were obtained from the base of the skull through the vertex without contrast.  Comparison: 11/04/2011  Findings:  There is diffuse patchy low density throughout  the subcortical and periventricular white matter consistent with chronic small vessel ischemic change.  There is prominence of the sulci and ventricles consistent with brain atrophy.  There is no evidence for acute brain infarct, hemorrhage or mass.  There is mild mucosal thickening involving the right maxillary sinus and sphenoid sinus.  The mastoid air cells are clear.  The skull appears intact.  IMPRESSION:  1.  Small vessel ischemic change and brain atrophy. 2.  No acute intracranial abnormalities.  Original Report Authenticated By: Rosealee Albee, M.D.   Medications:      . albuterol  2.5 mg Nebulization Q6H  . atorvastatin  20 mg Oral q1800  . bisoprolol  5 mg Oral Daily  . calcium-vitamin D  1 tablet Oral Daily   . cefTRIAXone (ROCEPHIN)  IV  1 g Intravenous Q24H  . diltiazem  180 mg Oral Daily  . donepezil  10 mg Oral QHS  . doxycycline  100 mg Oral Q12H  . ferrous sulfate  325 mg Oral Q breakfast  . levothyroxine  100 mcg Oral QAC breakfast  . polyethylene glycol  17 g Oral QODAY  . potassium chloride SA  20 mEq Oral Daily  . senna  2 tablet Oral Q48H  . sodium chloride  3 mL Intravenous Q12H  . sodium chloride  3 mL Intravenous Q12H  . tiotropium  18 mcg Inhalation Daily  . warfarin  5 mg Oral ONCE-1800  . Warfarin - Pharmacist Dosing Inpatient   Does not apply q1800  . DISCONTD: furosemide  80 mg Oral BID    I  have reviewed scheduled and prn medications.     Problem/Plan: Active Problems:  HYPERTENSION  Atrial fib/flutter  CAP (community acquired pneumonia)  Chronic diastolic heart failure  Encounter for long-term (current) use of anticoagulants  Syncope and collapse  Hypothyroidism  1. Syncope and collapse: Unclear etiology. Differential diagnosis include worsening dyspnea, orthostatic hypotension, vasovagal syncope. CT head does not show acute findings. Check orthostatic blood pressures (not done-requested nurses to perform). 2-D echocardiogram shows mild LVH, mild aortic valve stenosis and moderate pulmonary hypertension. Follow carotid Doppler results. Awaiting physical therapy and occupational therapy evaluation. 2. Left lower lobe pneumonia: Community-acquired versus aspiration during an episode of vomiting. Day 2 of intravenous Rocephin and oral doxycycline. Will need repeat chest x-ray in a couple of weeks to ensure resolution. 3. Mild acute on chronic respiratory failure: Acute respiratory failure is possibly secondary to pneumonia and associated mild COPD exacerbation. Chronic respiratory failure secondary to chronic heart failure, pulmonary hypertension and COPD. Treat with IV antibiotics, bronchodilator nebulizations and oxygen. Improving. 4. Mild acute renal failure:  Possibly secondary to diuretics and associated dehydration. Improved. She probably will have higher creatinine secondary to being on diuretics. Resume diuretics. 5. Chronic diastolic congestive heart failure: Clinically compensated. 6. Hypothyroidism: Clinically and biochemically euthyroid. TSH is 1.968 7. Atrial fibrillation: Controlled ventricular rate and anticoagulated. Continue Cardizem 8. Presumed respiratory arrest as per history from Aide. 9. Long QT interval: Improved. EKG shows atrial fibrillation, low-voltage and QTC of 459 ms. 10. DO NOT RESUSCITATE  Disposition: Possible discharge in 48 hours.  Westyn Keatley 03/06/2012,12:05 PM  LOS: 2 days

## 2012-03-06 NOTE — Evaluation (Signed)
Physical Therapy Evaluation Patient Details Name: [name] MRN: [id number] DOB: 1922-02-28 Today's Date: 03/06/2012  Problem List:  Patient Active Problem List  Diagnoses  . ADENOCARCINOMA, BREAST, RIGHT  . HYPERLIPIDEMIA  . HYPERTENSION  . C O P D  . Atrial fib/flutter  . Peripheral edema  . Abnormal chest CT  . Bradycardia  . Acute renal failure  . Acute respiratory failure  . CAP (community acquired pneumonia)  . Chronic diastolic heart failure  . Aortic valve disorders  . Encounter for long-term (current) use of anticoagulants  . Syncope and collapse  . Hypothyroidism    Past Medical History:  Past Medical History  Diagnosis Date  . COPD (chronic obstructive pulmonary disease)   . Hyperlipidemia   . Hypertension   . Osteoporosis   . Hypothyroid   . Memory loss   . Anemia     Presumed GI on iron replacement hemoglobin in the 10 range  . Atrial flutter     Coumadin initiated 7/12 and followed by PCP; patient felt to be high risk for RFCA secondary to COPD;  echo was done 7/30:  EF 50% to 55%. Wall motion was normal ;there were no regional wall motion abnormalities, mild AS, MAC, mild LAE, mild RAE, PASP 35, mild pulmonary HTN  . On home O2   . Anxiety   . Depression   . CHF (congestive heart failure)   . Emphysema   . Chronic kidney disease   . Breast cancer   . DEMENTIA 11/04/11    "she has some significant dementia"   Past Surgical History:  Past Surgical History  Procedure Date  . Tonsillectomy   . Appendectomy   . Abdominal hysterectomy   . Mastectomy 1990    right  . Cataract extraction     "don't know if both eyes or if she had lenses put in"    PT Assessment/Plan/Recommendation PT Assessment Clinical Impression Statement: Pt is a pleasant 78 y/o female with history of slight dementia.  Pt has 24/7 assistance at home via paid caregiver.  Pt  presents with generalized weakness and decreased activity tolerance.  Pt should be safe to discharge  home with 24/7 assistance. Acute PT will follow pt to promote functional mobility and activity tolerance. Suggesting HHPT consult.   PT Recommendation/Assessment: Patient will need skilled PT in the acute care venue PT Problem List: Decreased strength;Decreased activity tolerance;Decreased mobility Barriers to Discharge: None PT Therapy Diagnosis : Difficulty walking;Generalized weakness PT Plan PT Frequency: Min 3X/week PT Treatment/Interventions: DME instruction;Gait training;Functional mobility training;Therapeutic activities;Therapeutic exercise;Neuromuscular re-education;Patient/family education PT Recommendation Follow Up Recommendations: Home health PT;Supervision/Assistance - 24 hour Equipment Recommended: Defer to next venue PT Goals  Acute Rehab PT Goals PT Goal Formulation: With patient/family Time For Goal Achievement: 2 weeks Pt will go Supine/Side to Sit: with supervision PT Goal: Supine/Side to Sit - Progress: Goal set today Pt will Sit at Edge of Bed: with supervision PT Goal: Sit at Edge Of Bed - Progress: Goal set today Pt will go Sit to Supine/Side: with supervision PT Goal: Sit to Supine/Side - Progress: Goal set today Pt will go Sit to Stand: with supervision PT Goal: Sit to Stand - Progress: Goal set today Pt will go Stand to Sit: with supervision PT Goal: Stand to Sit - Progress: Goal set today Pt will Transfer Bed to Chair/Chair to Bed: with supervision PT Transfer Goal: Bed to Chair/Chair to Bed - Progress: Goal set today Pt will Ambulate: 1 - 15 feet;with  supervision PT Goal: Ambulate - Progress: Goal set today  PT Evaluation Precautions/Restrictions  Precautions Precautions: Fall Restrictions Weight Bearing Restrictions: No Prior Functioning  Home Living Lives With: Alone Receives Help From: Personal care attendant Type of Home: House Home Layout: One level Alternate Level Stairs-Number of Steps: 0 Entrance Stairs-Number of Steps: 0 Bathroom  Shower/Tub: Tub/shower unit;Walk-in shower;Curtain Bathroom Toilet: Standard Bathroom Accessibility: Yes How Accessible: Accessible via walker Home Adaptive Equipment: Walker - rolling;Bedside commode/3-in-1;Shower chair with back;Hospital bed;Hand-held shower hose;Grab bars in shower;Grab bars around toilet;Straight cane;Wheelchair - manual Prior Function Level of Independence: Needs assistance with ADLs;Needs assistance with homemaking;Independent with gait Able to Take Stairs?: No Driving: No Vocation: Retired Producer, television/film/video: Awake/alert Overall Cognitive Status: Impaired Orientation Level: Oriented to person;Oriented to place Sensation/Coordination Sensation Light Touch: Appears Intact Coordination Gross Motor Movements are Fluid and Coordinated: Yes Fine Motor Movements are Fluid and Coordinated: Not tested Extremity Assessment RUE Assessment RUE Assessment: Within Functional Limits LUE Assessment LUE Assessment: Exceptions to [facility] LUE Strength LUE Overall Strength: Deficits;Due to premorbid status RLE Assessment RLE Assessment: Within Functional Limits LLE Assessment LLE Assessment: Exceptions to [facility] LLE Strength LLE Overall Strength: Deficits Mobility (including Balance) Bed Mobility Bed Mobility: Yes Supine to Sit: 3: Mod assist;HOB elevated (Comment degrees) (Pt 60% HOB at 45 degrees. ) Supine to Sit Details (indicate cue type and reason): Assist to raise shoulders from bed due to weakness.  Transfers Transfers: Yes Sit to Stand: 4: Min assist;From bed;With upper extremity assist (Pt 80%) Sit to Stand Details (indicate cue type and reason): Cues for hand placement assist to stabilize pt until full knee extension was achieved.  Attempted to take standing blood pressure to rule out orthostatic hypotension,  but pt unable to maintain standing long enough.     Stand to Sit: 4: Min assist;With armrests;To chair/3-in-1;With upper extremity  assist Stand to Sit Details: Cues for hand placement and VC's to control descent to chair.   Stand Pivot Transfers: 5: Supervision Stand Pivot Transfer Details (indicate cue type and reason): Cues for sequencing close supervision for safety.  Ambulation/Gait Ambulation/Gait: No Ambulation/Gait Assistance: Not tested (comment) Wheelchair Mobility Wheelchair Mobility: No  Posture/Postural Control Posture/Postural Control: Postural limitations Postural Limitations: Flexed trunk posture in sitting and standing.  Balance Balance Assessed: Yes Static Sitting Balance Static Sitting - Balance Support: Feet unsupported;Bilateral upper extremity supported Static Sitting - Level of Assistance: 5: Stand by assistance Static Sitting - Comment/# of Minutes: several minutes at EOB while taking pt's blood pressure.  NO LOB.  Exercise    End of Session PT - End of Session Equipment Utilized During Treatment: Gait belt Activity Tolerance: Patient limited by fatigue Patient left: in chair;with family/visitor present (Paid caregiver present. ) Nurse Communication: Mobility status for transfers General Behavior During Session: [facility] for tasks performed Cognition: Impaired  Tamya Denardo 03/06/2012, 2:49 PM Xoe Hoe L. Mosie Angus DPT [phone]

## 2012-03-07 DIAGNOSIS — R55 Syncope and collapse: Secondary | ICD-10-CM

## 2012-03-07 LAB — PROTIME-INR
INR: 2.11 — ABNORMAL HIGH (ref 0.00–1.49)
Prothrombin Time: 24 seconds — ABNORMAL HIGH (ref 11.6–15.2)

## 2012-03-07 MED ORDER — WARFARIN SODIUM 5 MG PO TABS
5.0000 mg | ORAL_TABLET | Freq: Once | ORAL | Status: AC
  Administered 2012-03-07: 5 mg via ORAL
  Filled 2012-03-07 (×2): qty 1

## 2012-03-07 NOTE — Progress Notes (Signed)
Subjective:   Complains of mild dyspnea this morning. No chest pain or cough.  Objective  Vital signs in last 24 hours: Filed Vitals:   03/06/12 2130 03/07/12 0216 03/07/12 0550 03/07/12 0828  BP: 116/62  112/56   Pulse: 56  84   Temp: 97.8 F (36.6 C)  97.9 F (36.6 C)   TempSrc:      Resp: 18  16   Height:      Weight:   91.4 kg (201 lb 8 oz)   SpO2: 95% 96% 96% 94%   Weight change:   Intake/Output Summary (Last 24 hours) at 03/07/12 1209 Last data filed at 03/07/12 0900  Gross per 24 hour  Intake    360 ml  Output      0 ml  Net    360 ml    Physical Exam:  General Exam: Elderly frail female patient who looks chronically ill. Respiratory System: Decreased breath sounds right greater than left with few expiratory rhonchi but no crackles. No increased work of breathing. Cardiovascular System: First and second heart sounds heard. Irregular rate and rhythm. No JVD/murmurs. Telemetry shows atrial fibrillation in the 70s. No pedal edema Gastrointestinal System: Abdomen is non distended, soft and normal bowel sounds heard.  Central Nervous System: Alert and oriented to self and partly to place. No focal neurological deficits.  Labs:  Basic Metabolic Panel:  Lab 03/06/12 1610 03/05/12 0505 03/04/12 2026  NA 141 139 138  K 3.7 4.1 5.1  CL 97 95* 95*  CO2 37* 39* 34*  GLUCOSE 101* 102* 114*  BUN 26* 32* 31*  CREATININE 1.29* 1.66* 1.68*  CALCIUM 9.4 9.5 9.5  ALB -- -- --  PHOS -- -- --   Liver Function Tests:  Lab 03/04/12 2026  AST 22  ALT 13  ALKPHOS 52  BILITOT 0.4  PROT 5.9*  ALBUMIN 3.2*   No results found for this basename: LIPASE:3,AMYLASE:3 in the last 168 hours No results found for this basename: AMMONIA:3 in the last 168 hours CBC:  Lab 03/05/12 0505 03/04/12 2055  WBC 6.8 8.5  NEUTROABS -- 6.9  HGB 9.5* 9.7*  HCT 30.5* 31.4*  MCV 97.1 96.0  PLT 149* 148*   Cardiac Enzymes:  Lab 03/05/12 1708 03/05/12 0808 03/05/12 0104 03/04/12 2100  03/04/12 2026  CKTOTAL 27 28 32 -- --  CKMB 1.1 1.1 1.2 -- --  CKMBINDEX -- -- -- -- --  TROPONINI <0.30 <0.30 <0.30 <0.30 <0.30   CBG: No results found for this basename: GLUCAP:5 in the last 168 hours  Iron Studies: No results found for this basename: IRON,TIBC,TRANSFERRIN,FERRITIN in the last 72 hours Studies/Results: No results found. Medications:      . albuterol  2.5 mg Nebulization Q6H  . atorvastatin  20 mg Oral q1800  . bisoprolol  5 mg Oral Daily  . calcium-vitamin D  1 tablet Oral Daily  . cefTRIAXone (ROCEPHIN)  IV  1 g Intravenous Q24H  . diltiazem  180 mg Oral Daily  . donepezil  10 mg Oral QHS  . doxycycline  100 mg Oral Q12H  . ferrous sulfate  325 mg Oral Q breakfast  . furosemide  60 mg Oral BID  . levothyroxine  100 mcg Oral QAC breakfast  . polyethylene glycol  17 g Oral QODAY  . potassium chloride SA  20 mEq Oral Daily  . senna  2 tablet Oral Q48H  . sodium chloride  3 mL Intravenous Q12H  . sodium chloride  3 mL Intravenous Q12H  . tiotropium  18 mcg Inhalation Daily  . warfarin  5 mg Oral ONCE-1800  . warfarin  5 mg Oral Once  . Warfarin - Pharmacist Dosing Inpatient   Does not apply q1800    I  have reviewed scheduled and prn medications.     Problem/Plan: Active Problems:  HYPERTENSION  Atrial fib/flutter  CAP (community acquired pneumonia)  Chronic diastolic heart failure  Encounter for long-term (current) use of anticoagulants  Syncope and collapse  Hypothyroidism  1. Syncope and collapse: Unclear etiology. Differential diagnosis include worsening dyspnea, orthostatic hypotension, vasovagal syncope. CT head does not show acute findings. Orthostatic blood pressures have not been charted but according to patient's nursing, done yesterday were said to be negative. 2-D echocardiogram shows mild LVH, mild aortic valve stenosis and moderate pulmonary hypertension. Follow carotid Doppler results-yet to be done. When stable, for home with home  health physical therapy and 24-hour supervision. 2. Left lower lobe pneumonia: Community-acquired versus aspiration during an episode of vomiting. Day 3 of intravenous Rocephin and oral doxycycline. Will need repeat chest x-ray in a couple of weeks to ensure resolution. 3. Mild acute on chronic respiratory failure: Acute respiratory failure is possibly secondary to pneumonia and associated mild COPD exacerbation. Chronic respiratory failure secondary to chronic heart failure, pulmonary hypertension and COPD. Treat with IV antibiotics, bronchodilator nebulizations and oxygen. Improving. 4. Mild acute renal failure: Possibly secondary to diuretics and associated dehydration. Improved. She probably will have higher creatinine secondary to being on diuretics. Resumed diuretics. 5. Chronic diastolic congestive heart failure: Clinically compensated. 6. Hypothyroidism: Clinically and biochemically euthyroid. TSH is 1.968 7. Atrial fibrillation: Controlled ventricular rate and anticoagulated. Continue Cardizem 8. Presumed respiratory arrest as per history from Aide. 9. Long QT interval: Improved. EKG shows atrial fibrillation, low-voltage and QTC of 459 ms. 10. DO NOT RESUSCITATE  Disposition: Possible discharge in 24 hours.  Discussed with patient's nursing.  Brin Ruggerio 03/07/2012,12:09 PM  LOS: 3 days

## 2012-03-07 NOTE — Progress Notes (Signed)
ANTICOAGULATION CONSULT NOTE - Follow Up Consult  Pharmacy Consult for coumadin Indication: aflutter  Allergies  Allergen Reactions  . Codeine     REACTION: nausea    Patient Measurements: Height: 5\' 4"  (162.6 cm) Weight: 201 lb 8 oz (91.4 kg) IBW/kg (Calculated) : 54.7  Heparin Dosing Weight:   Vital Signs: Temp: 97.9 F (36.6 C) (03/24 0550) BP: 112/56 mmHg (03/24 0550) Pulse Rate: 84  (03/24 0550)  Labs:  Alvira Philips 03/07/12 1610 03/06/12 0920 03/06/12 0500 03/05/12 1708 03/05/12 0808 03/05/12 0505 03/05/12 0104 03/04/12 2055 03/04/12 2026  HGB -- -- -- -- -- 9.5* -- 9.7* --  HCT -- -- -- -- -- 30.5* -- 31.4* --  PLT -- -- -- -- -- 149* -- 148* --  APTT -- -- -- -- -- -- -- -- --  LABPROT 24.0* 24.6* -- -- -- 19.1* -- -- --  INR 2.11* 2.18* -- -- -- 1.57* -- -- --  HEPARINUNFRC -- -- -- -- -- -- -- -- --  CREATININE -- -- 1.29* -- -- 1.66* -- -- 1.68*  CKTOTAL -- -- -- 27 28 -- 32 -- --  CKMB -- -- -- 1.1 1.1 -- 1.2 -- --  TROPONINI -- -- -- <0.30 <0.30 -- <0.30 -- --   Estimated Creatinine Clearance: 33.7 ml/min (by C-G formula based on Cr of 1.29).  Assessment: INR is 2.11, therapeutic. Pt doing much better. For probably d/c tomorrow. Goal of Therapy:  INR 2-3   Plan:  INR is therapeutic at 2.11. Will continue home dose of coumadin 5 mg daily. Will f/u INR in AM.  Eugene Garnet 03/07/2012,2:54 PM

## 2012-03-07 NOTE — Progress Notes (Signed)
VASCULAR LAB PRELIMINARY  PRELIMINARY  PRELIMINARY  PRELIMINARY  Carotid duplex  completed.    Preliminary report:  Bilateral:  No evidence of hemodynamically significant internal carotid artery stenosis.   Vertebral artery flow is antegrade.      Terance Hart, RVT 03/07/2012, 2:28 PM

## 2012-03-08 LAB — PROTIME-INR: Prothrombin Time: 22 seconds — ABNORMAL HIGH (ref 11.6–15.2)

## 2012-03-08 MED ORDER — DOXYCYCLINE HYCLATE 100 MG PO TABS: 100.0000 mg | ORAL_TABLET | Freq: Two times a day (BID) | ORAL | Status: AC

## 2012-03-08 NOTE — Discharge Summary (Signed)
Discharge Summary  [name] MR#: [id number]  DOB:Mar 28, 1922  Date of Admission: 03/04/2012 Date of Discharge: 03/08/2012  Patient's PCP: Herb Grays, MD, MD  Attending Physician:Phylicia Mcgaugh  Consults: None.  Discharge Diagnoses: Active Problems:  HYPERTENSION  Atrial fib/flutter  CAP (community acquired pneumonia)  Chronic diastolic heart failure  Encounter for long-term (current) use of anticoagulants  Syncope and collapse  Hypothyroidism   Brief Admitting History and Physical 78 y.o. female with history of COPD, hyperlipidemia, hypertension, hypothyroidism, chronic atrial fibrillation on anticoagulation, dementia, history of congestive heart failure, on home O2, according to her home aide Ms. Vida Coffie, patient was ambulating with the help of a walker to go to the bathroom and after taking a few steps became extremely dyspneic started shaking(not seizure like) and then passed out. She thinks that patient was not breathing for a brief while but had feeble carotid pulsations. She started to perform CPR and called 911. By this time patient woke up and patient's mental status quickly returned to baseline. She indicates that patient is oxygen dependent at 3.5 L per minute and has chronic dyspnea.Evaluation in the emergency room included an EKG which showed atrial fibrillation, rate controlled, but with QTC of 515 ms. For head CT was unremarkable for any acute process, her chest x-ray showed question of a new left lower lobe infiltrate. She has no fever, cough, or leukocytosis. Troponin was negative. Hospitalist was asked to admit her for workup of her syncope.     Discharge Medications Current Discharge Medication List    START taking these medications   Details  doxycycline (VIBRA-TABS) 100 MG tablet Take 1 tablet (100 mg total) by mouth every 12 (twelve) hours. Qty: 8 tablet, Refills: 0      CONTINUE these medications which have NOT CHANGED   Details  albuterol (PROAIR  HFA) 108 (90 BASE) MCG/ACT inhaler Inhale 2 puffs into the lungs every 4 (four) hours as needed. For shortness of breath    ALPRAZolam (XANAX) 0.25 MG tablet Take 0.25 mg by mouth at bedtime as needed. For anxiety    atorvastatin (LIPITOR) 20 MG tablet Take 1 tablet (20 mg total) by mouth at bedtime. Qty: 30 tablet, Refills: 6    bisoprolol (ZEBETA) 5 MG tablet Take 1 tablet (5 mg total) by mouth daily. Qty: 30 tablet, Refills: 6    Calcium Carbonate-Vitamin D (OSCAL 500/200 D-3 PO) Take 1 tablet by mouth daily.      diltiazem (DILACOR XR) 180 MG 24 hr capsule Take 180 mg by mouth daily.    donepezil (ARICEPT) 10 MG tablet Take 10 mg by mouth at bedtime.     ferrous sulfate 325 (65 FE) MG tablet Take 325 mg by mouth daily with breakfast.      furosemide (LASIX) 40 MG tablet Take 80 mg by mouth 2 (two) times daily. Or as instructed by your physician    levothyroxine (SYNTHROID, LEVOTHROID) 100 MCG tablet Take 100 mcg by mouth daily.      polyethylene glycol (MIRALAX / GLYCOLAX) packet Take 17 g by mouth every other day.     potassium chloride SA (KLOR-CON M20) 20 MEQ tablet Take 20 mEq by mouth daily.    senna (SENOKOT) 8.6 MG tablet Take 2 tablets by mouth every other day.     tiotropium (SPIRIVA HANDIHALER) 18 MCG inhalation capsule Place 1 capsule (18 mcg total) into inhaler and inhale daily. Qty: 30 capsule, Refills: 6    warfarin (COUMADIN) 5 MG tablet Take 5 mg by  mouth daily.      STOP taking these medications     ibuprofen (ADVIL,MOTRIN) 200 MG tablet Comments:  Reason for Stopping:          Hospital Course: <principal problem not specified> Present on Admission:  .Syncope and collapse .HYPERTENSION .Atrial fib/flutter .CAP (community acquired pneumonia) .Chronic diastolic heart failure .Hypothyroidism   1. Syncope and collapse: Unclear etiology. Differential diagnosis include worsening dyspnea, orthostatic hypotension, vasovagal syncope. CT head does not  show acute findings. Carotid duplex showed no evidence of hemodynamically significant internal carotid artery stenosis. Orthostatic blood pressures were said to be negative. 2-D echocardiogram shows mild LVH, mild aortic valve stenosis and moderate pulmonary hypertension. 2. Left lower lobe pneumonia: Community-acquired versus aspiration during an episode of vomiting. Day 4 of intravenous Rocephin and oral doxycycline. Patient was started on this combination of antibiotics do to initial concern for long QT syndrome. She will be discharged to complete total 7 days of oral doxycycline. Will need repeat chest x-ray in a couple of weeks to ensure resolution. Patient denies cough or dyspnea or chest pain. She indicates that her breathing is back to baseline. 3. Mild acute on chronic respiratory failure: Acute respiratory failure is possibly secondary to pneumonia and associated mild COPD exacerbation. Chronic respiratory failure secondary to chronic heart failure, pulmonary hypertension and COPD. improved. Patient has baseline chronic dyspnea. 4. Mild acute renal failure: Possibly secondary to diuretics and associated dehydration. Improved. She probably will have higher creatinine secondary to being on diuretics. Resumed diuretics. Outpatient followup as deemed necessary. 5. Chronic diastolic congestive heart failure: Clinically compensated. 6. Hypothyroidism: Clinically and biochemically euthyroid. TSH is 1.968 7. Atrial fibrillation: Controlled ventricular rate and anticoagulated. Continue Cardizem and home dose of Coumadin. 8. Presumed respiratory arrest as per history from Aide. Resolved  9. Long QT interval: Improved. EKG shows atrial fibrillation, low-voltage and QTC of 459 ms. 10. Anemia: stable. 11. DO NOT RESUSCITATE   Day of Discharge BP 119/66  Pulse 91  Temp(Src) 97.9 F (36.6 C) (Oral)  Resp 20  Ht 5\' 4"  (1.626 m)  Wt 90.9 kg (200 lb 6.4 oz)  BMI 34.40 kg/m2  SpO2 97%  General Exam:  Elderly frail female patient who looks chronically ill.  Respiratory System: Distant breath sounds and occasional rhonchi. No increased work of breathing.  Cardiovascular System: First and second heart sounds heard. Irregular rate and rhythm. No JVD/murmurs. Telemetry shows atrial fibrillation in the 70s. No pedal edema  Gastrointestinal System: Abdomen is non distended, soft and normal bowel sounds heard.  Central Nervous System: Alert and oriented to self and partly to place. No focal neurological deficits.   Results for orders placed during the hospital encounter of 03/04/12 (from the past 48 hour(s))  PROTIME-INR     Status: Abnormal   Collection Time   03/07/12  6:35 AM      Component Value Range Comment   Prothrombin Time 24.0 (*) 11.6 - 15.2 (seconds)    INR 2.11 (*) 0.00 - 1.49    PROTIME-INR     Status: Abnormal   Collection Time   03/08/12  6:40 AM      Component Value Range Comment   Prothrombin Time 22.0 (*) 11.6 - 15.2 (seconds)    INR 1.89 (*) 0.00 - 1.49     Lab data: 1. BMP on 3/23: Significant for bicarbonate 37, BUN 26 and creatinine 1.29 and blood glucose 101. 2. Cardiac enzymes cycled and negative. 3. Pro BNP on 3/22:4977. 4. CBC: Hemoglobin  9.5, white blood cell 6.8 and platelets 149. 5. INR: 1.89. 6. TSH 1.968. Dg Chest 1 View  03/04/2012  *RADIOLOGY REPORT*  Clinical Data: Loss of consciousness.  Shortness of breath.  CHEST - 1 VIEW  Comparison: Two-view chest 01/15/2012.  Findings: The heart is enlarged.  Left lower lobe airspace disease is new.  The left pleural effusion is also suggested.  Postsurgical changes are noted in the right axilla.  Chronic changes of the lungs are otherwise stable.  IMPRESSION:  1.  New left lower lobe airspace disease is concerning for pneumonia or aspiration. 2.  Stable cardiomegaly without failure. 3.  Otherwise stable chronic changes.  Original Report Authenticated By: Jamesetta Orleans. MATTERN, M.D.   Ct Head Wo Contrast  03/04/2012   *RADIOLOGY REPORT*  Clinical Data: Syncope  CT HEAD WITHOUT CONTRAST  Technique:  Contiguous axial images were obtained from the base of the skull through the vertex without contrast.  Comparison: 11/04/2011  Findings:  There is diffuse patchy low density throughout the subcortical and periventricular white matter consistent with chronic small vessel ischemic change.  There is prominence of the sulci and ventricles consistent with brain atrophy.  There is no evidence for acute brain infarct, hemorrhage or mass.  There is mild mucosal thickening involving the right maxillary sinus and sphenoid sinus.  The mastoid air cells are clear.  The skull appears intact.  IMPRESSION:  1.  Small vessel ischemic change and brain atrophy. 2.  No acute intracranial abnormalities.  Original Report Authenticated By: Rosealee Albee, M.D.   2-D echocardiogram:  Study Conclusions  - Left ventricle: The cavity size was normal. Wall thickness was increased in a pattern of mild LVH. Systolic function was normal. The estimated ejection fraction was in the range of 55% to 60%. The study is not technically sufficient to allow evaluation of LV diastolic function. - Aortic valve: Calculation of Aortic Valve with continuity equation is incorrect. Mildly calcified annulus. Moderately thickened, mildly calcified leaflets. Cusp separation was reduced. Valve mobility was restricted. There was mild stenosis. Trivial regurgitation. - Mitral valve: Calcified annulus. Moderately thickened, mildly calcified leaflets . Calcification, with involvement of posterior leaflet chords. - Left atrium: The atrium was mildly to moderately dilated. - Right atrium: The atrium was mildly dilated. - Pulmonary arteries: PA peak pressure: 52mm Hg (S). - Inferior vena cava: The vessel was moderately dilated; the respirophasic diameter changes were blunted (< 50%); findings are consistent with elevated central venous pressure. - Pericardium,  extracardiac: A trivial pericardial effusion was identified. Impressions:  - The right ventricular systolic pressure was increased consistent with moderate pulmonary hypertension.  Carotid duplex: Preliminary report: Bilateral: No evidence of hemodynamically significant internal carotid artery stenosis. Vertebral artery flow is antegrade.    Disposition: Discharged home in stable condition. Patient will return home with home health RN and 24 hour aide that is hired by the family.  Diet: Heart healthy  Activity: Increase activity gradually.  Follow-up Appts: Discharge Orders    Future Appointments: Provider: Department: Dept Phone: Center:   03/30/2012 1:15 PM [facility] [phone] None     Future Orders Please Complete By Expires   Diet - low sodium heart healthy      Increase activity slowly      Call MD for:  difficulty breathing, headache or visual disturbances      Call MD for:  persistant dizziness or light-headedness         TESTS THAT NEED FOLLOW-UP Chest x-ray  in a couple of weeks from discharge to ensure resolution of pneumonia.  Time spent on discharge, talking to the patient, and coordinating care:40 mins.  Discussed patient's care at length with the patient's son Mr. Clydine Parkison and updated care and answered questions. It may be reasonable to consider home hospice evaluation which Mr. Slavick agrees and will pursue through patient's primary care physician.  SignedMarcellus Scott, MD 03/08/2012, 12:19 PM

## 2012-03-08 NOTE — Progress Notes (Signed)
Physical Therapy Treatment Patient Details Name: [name] MRN: [id number] DOB: 19-Apr-1922 Today's Date: 03/08/2012  PT Assessment/Plan  PT - Assessment/Plan Comments on Treatment Session: Patient s/p syncope.  Patient with improved gait today with PT.  Continues to need assist and cues for postural stability.  Has 24 hour caregiver. PT Plan: Discharge plan remains appropriate;Frequency remains appropriate PT Frequency: Min 3X/week Follow Up Recommendations: Home health PT;Supervision/Assistance - 24 hour Equipment Recommended: Defer to next venue PT Goals  Acute Rehab PT Goals PT Goal: Supine/Side to Sit - Progress: Progressing toward goal PT Goal: Sit at Edge Of Bed - Progress: Progressing toward goal PT Goal: Sit to Stand - Progress: Progressing toward goal PT Goal: Stand to Sit - Progress: Progressing toward goal PT Transfer Goal: Bed to Chair/Chair to Bed - Progress: Progressing toward goal PT Goal: Ambulate - Progress: Progressing toward goal  PT Treatment Precautions/Restrictions  Precautions Precautions: Fall Required Braces or Orthoses: No Restrictions Weight Bearing Restrictions: No Mobility (including Balance) Bed Mobility Supine to Sit: 3: Mod assist;HOB flat Supine to Sit Details (indicate cue type and reason): assist with upper body Transfers Sit to Stand: 4: Min assist;With upper extremity assist;From bed Sit to Stand Details (indicate cue type and reason): cues for hand placement Stand to Sit: 4: Min assist;With upper extremity assist;To chair/3-in-1 Stand to Sit Details: cues for hand placement and verbal cues to control descent into chair Stand Pivot Transfers: Not tested (comment) Ambulation/Gait Ambulation/Gait Assistance: 1: +2 Total assist;Patient percentage (comment) (pt = 80%) Ambulation/Gait Assistance Details (indicate cue type and reason): Patient with flexed posture, shortened step length and slow cadence.  Somewhat widened BOS.  Needed cues for  safety and for posture Stairs: No Wheelchair Mobility Wheelchair Mobility: No  Posture/Postural Control Posture/Postural Control: Postural limitations Postural Limitations: Flexed posture of trunk in sitting and standing. Balance Balance Assessed: Yes Static Sitting Balance Static Sitting - Balance Support: Bilateral upper extremity supported;Feet supported Static Sitting - Level of Assistance: 6: Modified independent (Device/Increase time) Exercise  Total Joint Exercises Heel Slides: AROM;Both;10 reps;Seated End of Session PT - End of Session Equipment Utilized During Treatment: Gait belt Activity Tolerance: Patient limited by fatigue Patient left: in chair;with call bell in reach;with family/visitor present Nurse Communication: Mobility status for transfers;Mobility status for ambulation General Behavior During Session: [facility] for tasks performed Cognition: Impaired  INGOLD,Jaythan Hinely 03/08/2012, 1:53 PM  [facility] [phone] (pager)

## 2012-03-08 NOTE — Progress Notes (Signed)
Utilization Review Completed.Sharelle Burditt T3/25/2013   

## 2012-03-19 ENCOUNTER — Ambulatory Visit (INDEPENDENT_AMBULATORY_CARE_PROVIDER_SITE_OTHER): Payer: Self-pay | Admitting: Cardiology

## 2012-03-19 DIAGNOSIS — R0989 Other specified symptoms and signs involving the circulatory and respiratory systems: Secondary | ICD-10-CM

## 2012-03-19 DIAGNOSIS — I4892 Unspecified atrial flutter: Secondary | ICD-10-CM

## 2012-03-19 DIAGNOSIS — Z7901 Long term (current) use of anticoagulants: Secondary | ICD-10-CM

## 2012-03-23 ENCOUNTER — Ambulatory Visit: Payer: Self-pay | Admitting: Cardiology

## 2012-03-23 DIAGNOSIS — I4892 Unspecified atrial flutter: Secondary | ICD-10-CM

## 2012-03-23 DIAGNOSIS — Z7901 Long term (current) use of anticoagulants: Secondary | ICD-10-CM

## 2012-03-30 ENCOUNTER — Ambulatory Visit (HOSPITAL_COMMUNITY): Payer: Medicare Other

## 2012-04-14 DEATH — deceased

## 2012-11-30 IMAGING — CR DG CHEST 1V PORT
1 series · 1 of 1 positions shown · non-contrast
Comparison: 11/04/2011.

CLINICAL DATA: History of airspace disease.  Follow-up.

PORTABLE CHEST - 1 VIEW

[view not recorded]
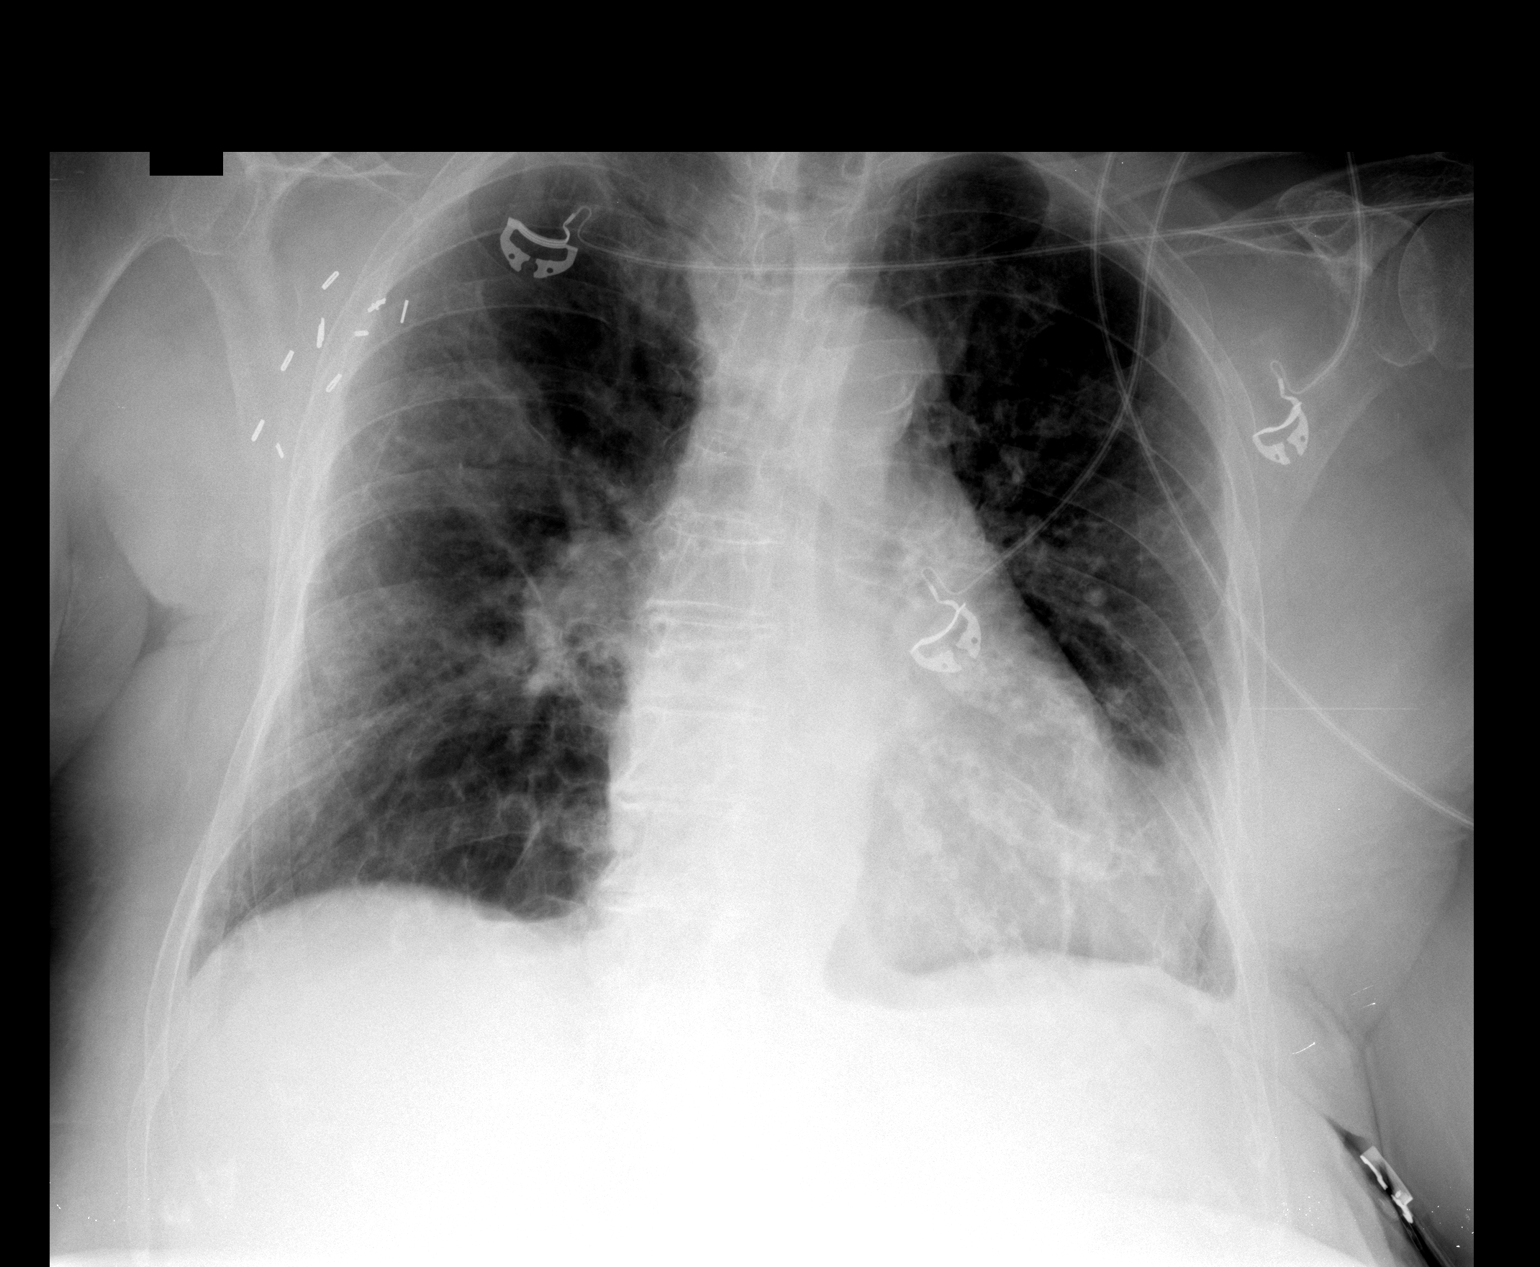

[1 of 1 positions shown; findings below may reference images not displayed]

FINDINGS: Surgical clips are seen in right axillary region.
Cardiac silhouette is upper range of normal size. Ectasia and
nonaneurysmal calcification of the thoracic aorta are seen.  There
is hazy infiltrative density in right upper lobe without
consolidation.  Minimal left basilar subsegmental atelectasis is
present.  No definite pleural effusion is seen.  Osteopenic
appearance of bones.  Degenerative spondylosis.
IMPRESSION: There is hazy infiltrative density in right upper lobe without
consolidation.  Minimal left basilar subsegmental atelectasis is
present. No new lesion is evident.

## 2012-12-01 IMAGING — CR DG CHEST 1V PORT
1 series · 1 of 1 positions shown · non-contrast
Comparison: Yesterday

CLINICAL DATA: Pneumonia

PORTABLE CHEST - 1 VIEW

[AP]
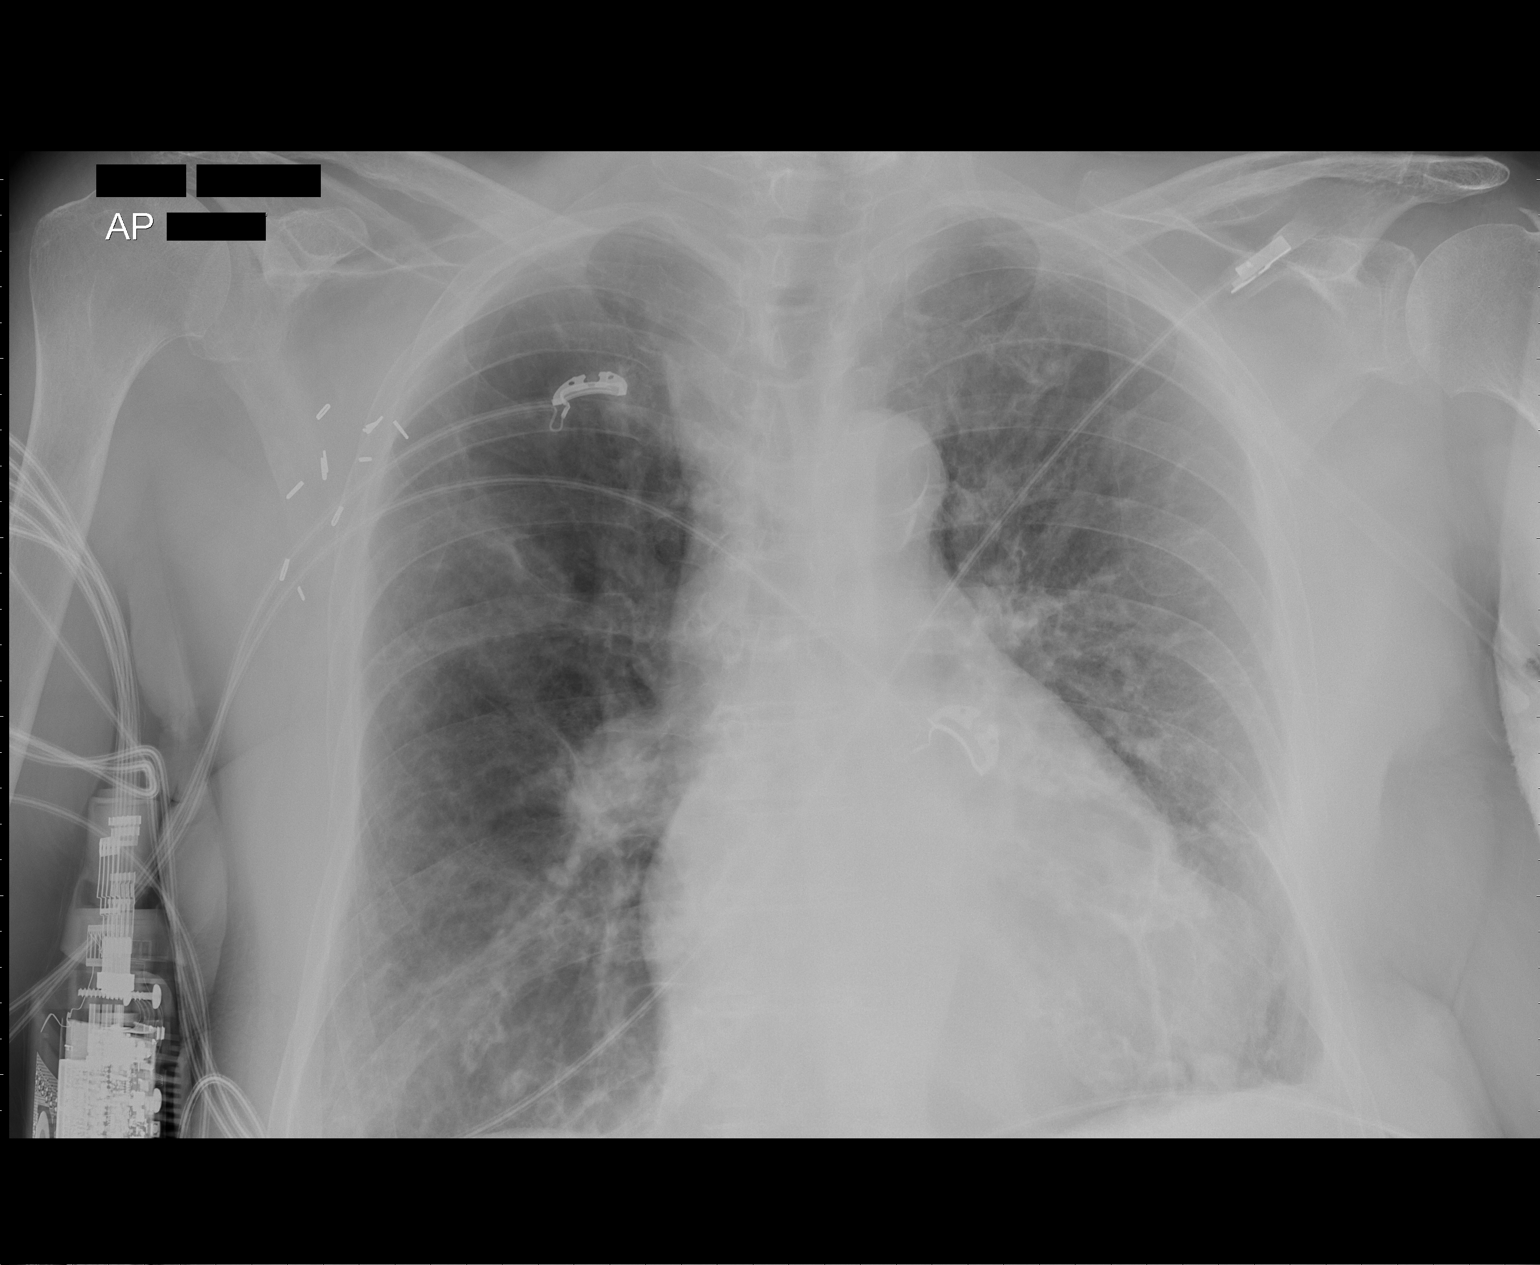

[1 of 1 positions shown; findings below may reference images not displayed]

FINDINGS: Stable focal irregular opacity in the right upper lobe.
Increasing interstitial prominence and cardiomegaly.  Bronchitic
changes.  No pneumothorax.  Left basilar scarring.
IMPRESSION: Suspect increasing mild interstitial edema.

Stable right upper lobe opacity.

## 2012-12-03 IMAGING — CR DG CHEST 2V
2 series · 2 of 2 positions shown · non-contrast
Comparison: 11/07/2011

CLINICAL DATA: Follow up airspace disease

CHEST - 2 VIEW

[w chest lat]
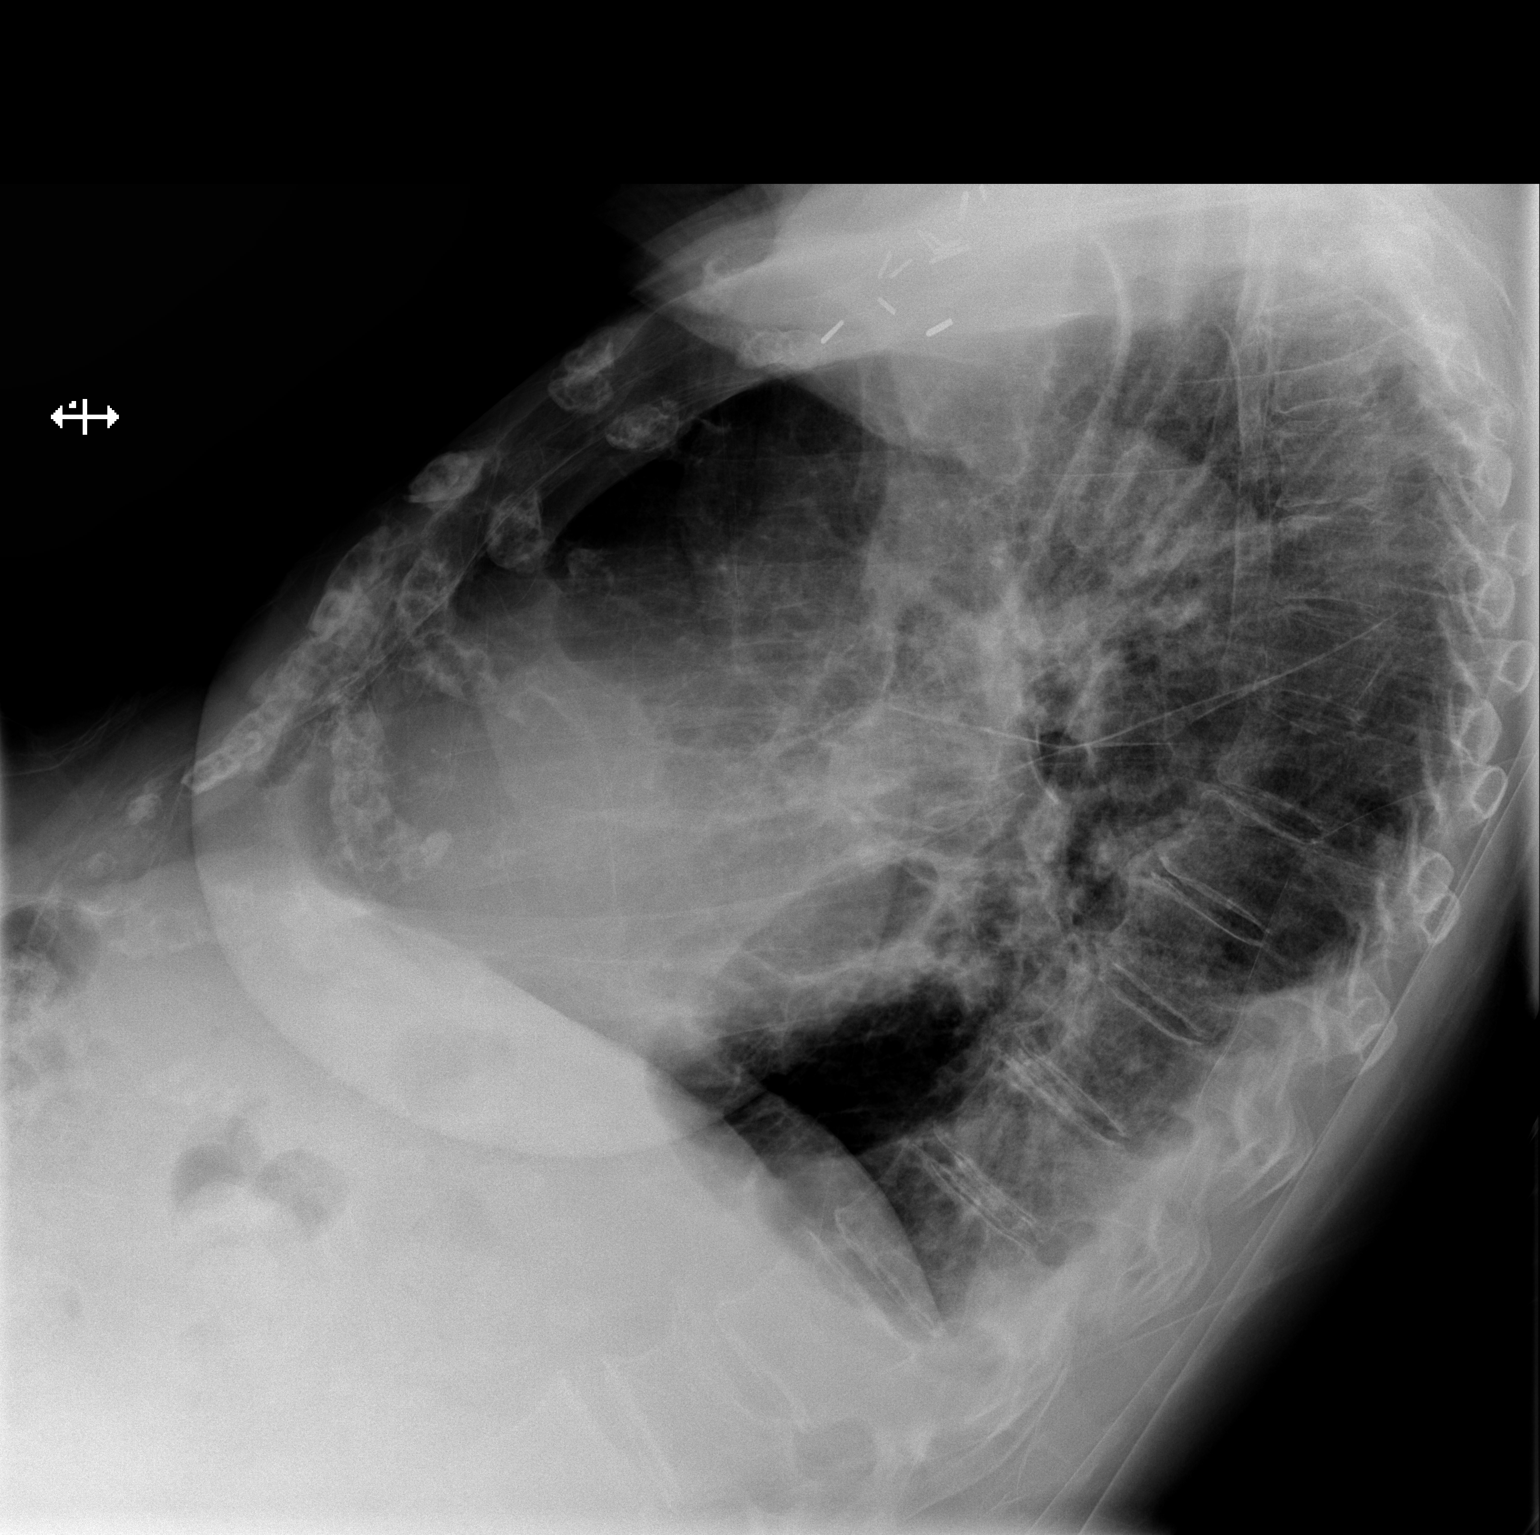

[x chest ap]
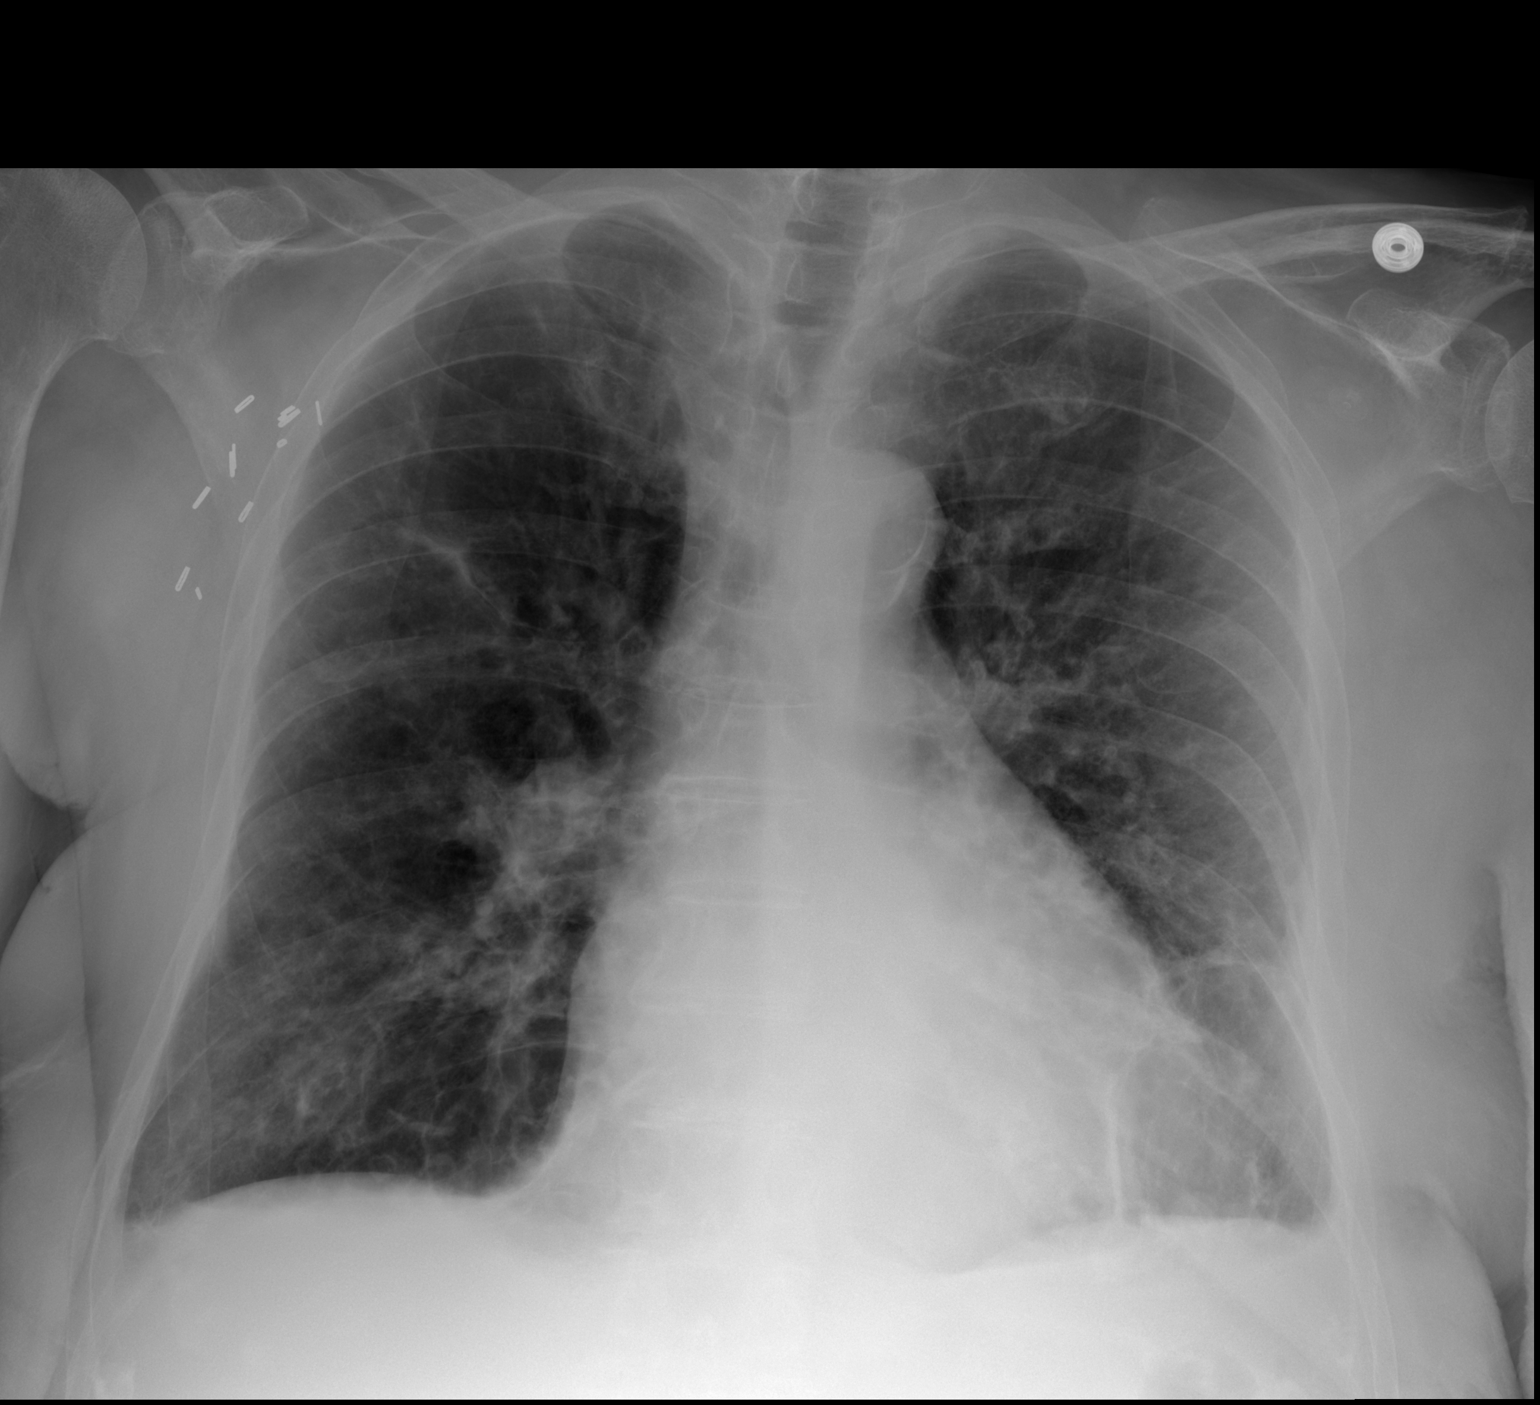

[2 of 2 positions shown; findings below may reference images not displayed]

FINDINGS: The heart size is moderately enlarged.  There are diffuse
coarsened interstitial markings bilaterally consistent with COPD.

Scarring is noted within the right upper lobe and left base.

No superimposed airspace consolidation identified.  There are small
pleural effusions and mild superimposed interstitial edema.
IMPRESSION: 1.  Mild edema and small effusions.  Unchanged.

## 2012-12-26 IMAGING — CR DG CHEST 1V PORT
1 series · 1 of 1 positions shown · non-contrast
Comparison: 11/08/2011

CLINICAL DATA: Shortness of breath

PORTABLE CHEST - 1 VIEW

[AP]
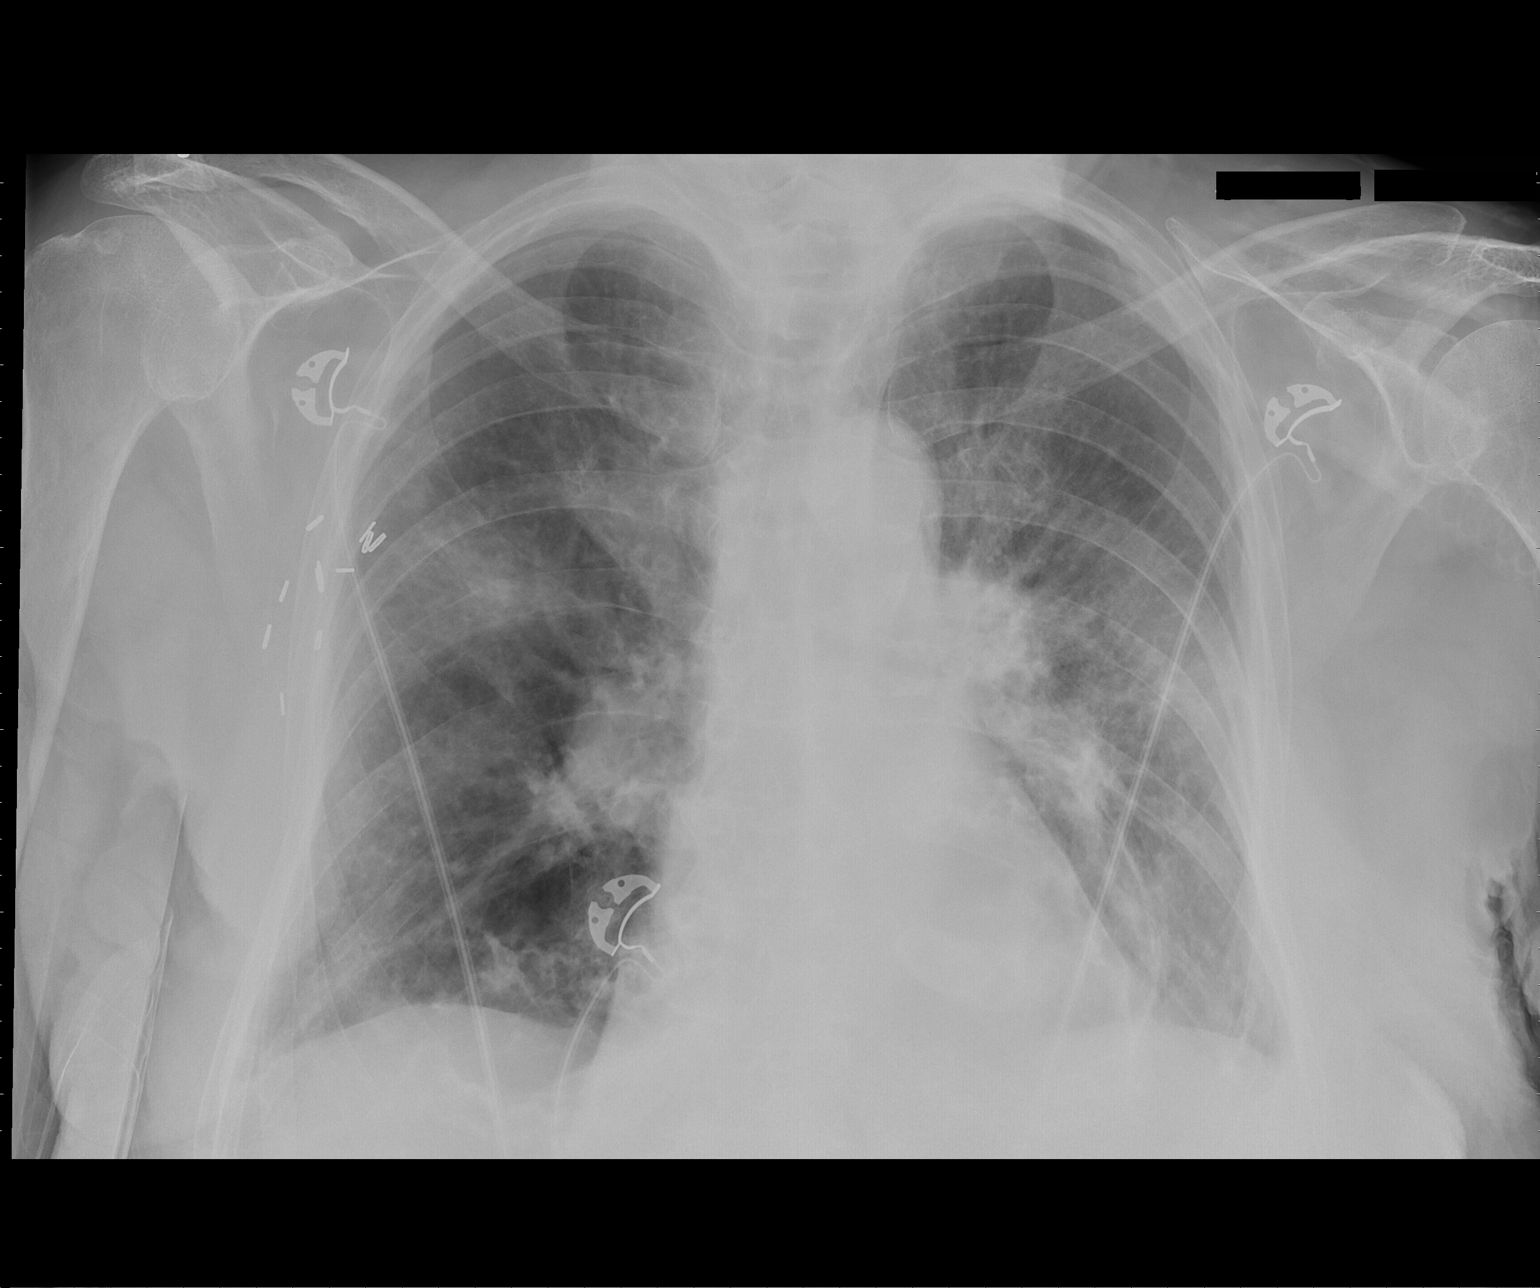

[1 of 1 positions shown; findings below may reference images not displayed]

FINDINGS: Cardio mediastinal silhouette is stable.  Worsening
central vascular congestion and perihilar interstitial prominence
suspicious for mild interstitial edema.  Surgical clips in the
right axilla again noted.  There is patchy airspace disease in the
right upper lobe and right base suspicious for superimposed
pneumonia.
IMPRESSION: Worsening central vascular congestion and perihilar interstitial
prominence suspicious for mild interstitial edema.  Surgical clips
in the right axilla again noted.  There is patchy airspace disease
in the right upper lobe and right base suspicious for superimposed
pneumonia.

## 2013-12-19 NOTE — Telephone Encounter (Signed)
No other info °
